# Patient Record
Sex: Male | Born: 1945 | Race: White | Hispanic: No | Smoking: Current some day smoker
Health system: Southern US, Community
[De-identification: ages and names within clinical notes are randomized; demographics above are authoritative.]

## PROBLEM LIST (undated history)

## (undated) DIAGNOSIS — J449 Chronic obstructive pulmonary disease, unspecified: Secondary | ICD-10-CM

## (undated) DIAGNOSIS — K810 Acute cholecystitis: Secondary | ICD-10-CM

## (undated) DIAGNOSIS — K838 Other specified diseases of biliary tract: Secondary | ICD-10-CM

## (undated) DIAGNOSIS — C4491 Basal cell carcinoma of skin, unspecified: Secondary | ICD-10-CM

## (undated) DIAGNOSIS — F1010 Alcohol abuse, uncomplicated: Secondary | ICD-10-CM

## (undated) DIAGNOSIS — I10 Essential (primary) hypertension: Secondary | ICD-10-CM

## (undated) HISTORY — PX: BASAL CELL CARCINOMA EXCISION: SHX1214

## (undated) HISTORY — DX: Acute cholecystitis: K81.0

## (undated) HISTORY — PX: LUMBAR LAMINECTOMY: SHX95

## (undated) HISTORY — PX: REPLACEMENT TOTAL KNEE BILATERAL: SUR1225

---

## 1999-04-15 ENCOUNTER — Inpatient Hospital Stay: Admission: EM | Admit: 1999-04-15 | Discharge: 1999-04-19 | Payer: Self-pay | Admitting: Emergency Medicine

## 1999-04-15 ENCOUNTER — Encounter: Payer: Self-pay | Admitting: Pulmonary Disease

## 1999-04-27 ENCOUNTER — Inpatient Hospital Stay (HOSPITAL_COMMUNITY): Admission: AD | Admit: 1999-04-27 | Discharge: 1999-05-01 | Payer: Self-pay | Admitting: *Deleted

## 1999-05-02 ENCOUNTER — Other Ambulatory Visit: Admission: RE | Admit: 1999-05-02 | Discharge: 1999-05-12 | Payer: Self-pay | Admitting: Psychiatry

## 1999-05-21 ENCOUNTER — Emergency Department (HOSPITAL_COMMUNITY): Admission: EM | Admit: 1999-05-21 | Discharge: 1999-05-21 | Payer: Self-pay | Admitting: Emergency Medicine

## 1999-05-22 ENCOUNTER — Other Ambulatory Visit (HOSPITAL_COMMUNITY): Admission: RE | Admit: 1999-05-22 | Discharge: 1999-06-02 | Payer: Self-pay | Admitting: Psychiatry

## 2012-04-23 DIAGNOSIS — Z85828 Personal history of other malignant neoplasm of skin: Secondary | ICD-10-CM | POA: Insufficient documentation

## 2015-03-04 DIAGNOSIS — D72829 Elevated white blood cell count, unspecified: Secondary | ICD-10-CM | POA: Insufficient documentation

## 2015-03-04 DIAGNOSIS — R6510 Systemic inflammatory response syndrome (SIRS) of non-infectious origin without acute organ dysfunction: Secondary | ICD-10-CM | POA: Insufficient documentation

## 2015-03-04 DIAGNOSIS — F1990 Other psychoactive substance use, unspecified, uncomplicated: Secondary | ICD-10-CM | POA: Insufficient documentation

## 2015-03-04 DIAGNOSIS — G9340 Encephalopathy, unspecified: Secondary | ICD-10-CM | POA: Insufficient documentation

## 2015-03-04 DIAGNOSIS — R4182 Altered mental status, unspecified: Secondary | ICD-10-CM | POA: Insufficient documentation

## 2015-03-04 DIAGNOSIS — M6282 Rhabdomyolysis: Secondary | ICD-10-CM | POA: Insufficient documentation

## 2015-03-04 DIAGNOSIS — E876 Hypokalemia: Secondary | ICD-10-CM | POA: Diagnosis present

## 2015-03-07 DIAGNOSIS — E870 Hyperosmolality and hypernatremia: Secondary | ICD-10-CM | POA: Insufficient documentation

## 2017-04-26 ENCOUNTER — Encounter (HOSPITAL_COMMUNITY): Payer: Self-pay | Admitting: *Deleted

## 2017-04-26 ENCOUNTER — Other Ambulatory Visit: Payer: Self-pay

## 2017-04-26 ENCOUNTER — Inpatient Hospital Stay (HOSPITAL_COMMUNITY)
Admission: EM | Admit: 2017-04-26 | Discharge: 2017-05-02 | DRG: 444 | Disposition: A | Discharge status: Home / Self Care | Payer: Medicare Other | Attending: Surgery | Admitting: Surgery

## 2017-04-26 DIAGNOSIS — Z85828 Personal history of other malignant neoplasm of skin: Secondary | ICD-10-CM

## 2017-04-26 DIAGNOSIS — F17210 Nicotine dependence, cigarettes, uncomplicated: Secondary | ICD-10-CM | POA: Diagnosis present

## 2017-04-26 DIAGNOSIS — K0889 Other specified disorders of teeth and supporting structures: Secondary | ICD-10-CM | POA: Diagnosis present

## 2017-04-26 DIAGNOSIS — K82A1 Gangrene of gallbladder in cholecystitis: Secondary | ICD-10-CM | POA: Diagnosis present

## 2017-04-26 DIAGNOSIS — K8000 Calculus of gallbladder with acute cholecystitis without obstruction: Secondary | ICD-10-CM | POA: Diagnosis not present

## 2017-04-26 DIAGNOSIS — M549 Dorsalgia, unspecified: Secondary | ICD-10-CM | POA: Diagnosis present

## 2017-04-26 DIAGNOSIS — J869 Pyothorax without fistula: Secondary | ICD-10-CM | POA: Diagnosis present

## 2017-04-26 DIAGNOSIS — K810 Acute cholecystitis: Secondary | ICD-10-CM | POA: Diagnosis not present

## 2017-04-26 DIAGNOSIS — E876 Hypokalemia: Secondary | ICD-10-CM | POA: Diagnosis not present

## 2017-04-26 DIAGNOSIS — K838 Other specified diseases of biliary tract: Secondary | ICD-10-CM

## 2017-04-26 DIAGNOSIS — Y838 Other surgical procedures as the cause of abnormal reaction of the patient, or of later complication, without mention of misadventure at the time of the procedure: Secondary | ICD-10-CM | POA: Diagnosis not present

## 2017-04-26 DIAGNOSIS — Z79899 Other long term (current) drug therapy: Secondary | ICD-10-CM

## 2017-04-26 DIAGNOSIS — I10 Essential (primary) hypertension: Secondary | ICD-10-CM | POA: Diagnosis present

## 2017-04-26 DIAGNOSIS — K219 Gastro-esophageal reflux disease without esophagitis: Secondary | ICD-10-CM | POA: Diagnosis present

## 2017-04-26 DIAGNOSIS — C439 Malignant melanoma of skin, unspecified: Secondary | ICD-10-CM | POA: Diagnosis present

## 2017-04-26 DIAGNOSIS — K9189 Other postprocedural complications and disorders of digestive system: Secondary | ICD-10-CM | POA: Diagnosis not present

## 2017-04-26 DIAGNOSIS — Z86010 Personal history of colonic polyps: Secondary | ICD-10-CM

## 2017-04-26 DIAGNOSIS — G8929 Other chronic pain: Secondary | ICD-10-CM | POA: Diagnosis present

## 2017-04-26 DIAGNOSIS — K029 Dental caries, unspecified: Secondary | ICD-10-CM | POA: Diagnosis present

## 2017-04-26 DIAGNOSIS — Z96653 Presence of artificial knee joint, bilateral: Secondary | ICD-10-CM | POA: Diagnosis present

## 2017-04-26 DIAGNOSIS — Z791 Long term (current) use of non-steroidal anti-inflammatories (NSAID): Secondary | ICD-10-CM

## 2017-04-26 HISTORY — DX: Alcohol abuse, uncomplicated: F10.10

## 2017-04-26 HISTORY — DX: Basal cell carcinoma of skin, unspecified: C44.91

## 2017-04-26 HISTORY — DX: Essential (primary) hypertension: I10

## 2017-04-26 LAB — CBC WITH DIFFERENTIAL/PLATELET
Basophils Absolute: 0 10*3/uL (ref 0.0–0.1)
Basophils Relative: 0 %
Eosinophils Absolute: 0 10*3/uL (ref 0.0–0.7)
Eosinophils Relative: 0 %
HCT: 33.9 % — ABNORMAL LOW (ref 39.0–52.0)
Hemoglobin: 11.8 g/dL — ABNORMAL LOW (ref 13.0–17.0)
Lymphocytes Relative: 2 %
Lymphs Abs: 0.4 10*3/uL — ABNORMAL LOW (ref 0.7–4.0)
MCH: 31.6 pg (ref 26.0–34.0)
MCHC: 34.8 g/dL (ref 30.0–36.0)
MCV: 90.6 fL (ref 78.0–100.0)
Monocytes Absolute: 1 10*3/uL (ref 0.1–1.0)
Monocytes Relative: 5 %
Neutro Abs: 16.7 10*3/uL — ABNORMAL HIGH (ref 1.7–7.7)
Neutrophils Relative %: 93 %
Platelets: 173 10*3/uL (ref 150–400)
RBC: 3.74 MIL/uL — ABNORMAL LOW (ref 4.22–5.81)
RDW: 13.6 % (ref 11.5–15.5)
WBC: 18.1 10*3/uL — ABNORMAL HIGH (ref 4.0–10.5)

## 2017-04-26 LAB — COMPREHENSIVE METABOLIC PANEL
ALT: 18 U/L (ref 17–63)
AST: 20 U/L (ref 15–41)
Albumin: 2.9 g/dL — ABNORMAL LOW (ref 3.5–5.0)
Alkaline Phosphatase: 89 U/L (ref 38–126)
Anion gap: 16 — ABNORMAL HIGH (ref 5–15)
BUN: 40 mg/dL — ABNORMAL HIGH (ref 6–20)
CO2: 20 mmol/L — ABNORMAL LOW (ref 22–32)
Calcium: 8.7 mg/dL — ABNORMAL LOW (ref 8.9–10.3)
Chloride: 95 mmol/L — ABNORMAL LOW (ref 101–111)
Creatinine, Ser: 1.62 mg/dL — ABNORMAL HIGH (ref 0.61–1.24)
GFR calc Af Amer: 48 mL/min — ABNORMAL LOW (ref >60)
GFR calc non Af Amer: 42 mL/min — ABNORMAL LOW (ref >60)
Glucose, Bld: 114 mg/dL — ABNORMAL HIGH (ref 65–99)
Potassium: 3.1 mmol/L — ABNORMAL LOW (ref 3.5–5.1)
Sodium: 131 mmol/L — ABNORMAL LOW (ref 135–145)
Total Bilirubin: 1.3 mg/dL — ABNORMAL HIGH (ref 0.3–1.2)
Total Protein: 7 g/dL (ref 6.5–8.1)

## 2017-04-26 LAB — LIPASE, BLOOD: Lipase: 14 U/L (ref 11–51)

## 2017-04-26 MED ORDER — SODIUM CHLORIDE 0.9 % IV BOLUS
1000.0000 mL | Freq: Once | INTRAVENOUS | Status: AC
  Administered 2017-04-26: 1000 mL via INTRAVENOUS

## 2017-04-26 MED ORDER — METOCLOPRAMIDE HCL 5 MG/ML IJ SOLN
10.0000 mg | Freq: Once | INTRAMUSCULAR | Status: AC
  Administered 2017-04-26: 10 mg via INTRAVENOUS
  Filled 2017-04-26: qty 2

## 2017-04-26 NOTE — ED Provider Notes (Signed)
Patient placed in Quick Look pathway, seen and evaluated   Chief Complaint: Abdominal Pain, Dental pain   HPI:   72 y.o. male who presents for evaluation of multiple complaints today.  Patient reports that he has had worsening dental pain over the last several weeks.  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  Patient reports he is also felt like he is having some mild gum swelling noted to the right upper gum.  Additionally, patient reports that for the last 3 days, he has had some lower abdominal pain, most notably on the right side.  He reports he has had some decreased appetite.  Patient states that he has not had any nausea/vomiting.  Patient reports that he was concerned because his blood pressure was low, though he did not record a specific number.  Patient states that he measured a fever last night and states it was "1 degree higher than normal."  Patient states he came into the ED because he was concerned about sepsis.  Patient denies any chest pain, difficulty breathing, facial swelling.  ROS: Abdominal pain, dental pain  Physical Exam:   Gen: No distress  Neuro: Awake and Alert  Skin: Warm    Focused Exam: Diffuse dental caries noted throughout mouth.  Patient has tenderness palpation noted to the right upper gumline with no obvious gingival erythema, fluctuance.  No identifiable dental abscess.  Abdomen is soft, nondistended.  Patient does exhibit some tenderness to the right lower quadrant.  No rigidity, guarding.   Initiation of care has begun. The patient has been counseled on the process, plan, and necessity for staying for the completion/evaluation, and the remainder of the medical screening examination    Volanda Napoleon, PA-C 04/26/17 1657    Pattricia Boss, MD 04/27/17 [phone]

## 2017-04-26 NOTE — ED Triage Notes (Signed)
Pt in reports low BP, pt has broken teeth and enlarged, red gums with poor dental hygeine, pt c/o R inguinal pain that radiates to R shoulder, pt states, "I am afraid it is Sepsis. I have crusting in my nose and I am afraid it is MRSA." pt does not have open wounds on face, A&O x4

## 2017-04-27 ENCOUNTER — Encounter (HOSPITAL_COMMUNITY): Payer: Self-pay | Admitting: *Deleted

## 2017-04-27 ENCOUNTER — Other Ambulatory Visit: Payer: Self-pay

## 2017-04-27 ENCOUNTER — Emergency Department (HOSPITAL_COMMUNITY): Payer: Medicare Other

## 2017-04-27 DIAGNOSIS — K810 Acute cholecystitis: Secondary | ICD-10-CM

## 2017-04-27 HISTORY — DX: Acute cholecystitis: K81.0

## 2017-04-27 LAB — COMPREHENSIVE METABOLIC PANEL
ALBUMIN: 2.8 g/dL — AB (ref 3.5–5.0)
ALT: 18 U/L (ref 17–63)
ANION GAP: 12 (ref 5–15)
AST: 19 U/L (ref 15–41)
Alkaline Phosphatase: 104 U/L (ref 38–126)
BILIRUBIN TOTAL: 1.2 mg/dL (ref 0.3–1.2)
BUN: 23 mg/dL — ABNORMAL HIGH (ref 6–20)
CO2: 23 mmol/L (ref 22–32)
Calcium: 8.7 mg/dL — ABNORMAL LOW (ref 8.9–10.3)
Chloride: 99 mmol/L — ABNORMAL LOW (ref 101–111)
Creatinine, Ser: 1 mg/dL (ref 0.61–1.24)
GFR calc non Af Amer: >60 mL/min (ref >60)
GLUCOSE: 106 mg/dL — AB (ref 65–99)
POTASSIUM: 2.8 mmol/L — AB (ref 3.5–5.1)
SODIUM: 134 mmol/L — AB (ref 135–145)
Total Protein: 6.6 g/dL (ref 6.5–8.1)

## 2017-04-27 LAB — URINALYSIS, ROUTINE W REFLEX MICROSCOPIC
Bacteria, UA: NONE SEEN
Bilirubin Urine: NEGATIVE
Glucose, UA: NEGATIVE mg/dL
Ketones, ur: NEGATIVE mg/dL
Leukocytes, UA: NEGATIVE
Nitrite: NEGATIVE
Protein, ur: 30 mg/dL — AB
Specific Gravity, Urine: 1.018 (ref 1.005–1.030)
pH: 5 (ref 5.0–8.0)

## 2017-04-27 LAB — CBC
HEMATOCRIT: 36.2 % — AB (ref 39.0–52.0)
HEMOGLOBIN: 12.9 g/dL — AB (ref 13.0–17.0)
MCH: 32 pg (ref 26.0–34.0)
MCHC: 35.6 g/dL (ref 30.0–36.0)
MCV: 89.8 fL (ref 78.0–100.0)
Platelets: 183 10*3/uL (ref 150–400)
RBC: 4.03 MIL/uL — AB (ref 4.22–5.81)
RDW: 13.6 % (ref 11.5–15.5)
WBC: 17.7 10*3/uL — ABNORMAL HIGH (ref 4.0–10.5)

## 2017-04-27 MED ORDER — CLONIDINE HCL 0.1 MG PO TABS
0.1000 mg | ORAL_TABLET | Freq: Three times a day (TID) | ORAL | Status: DC
  Administered 2017-04-27 – 2017-05-02 (×15): 0.1 mg via ORAL
  Filled 2017-04-27 (×16): qty 1

## 2017-04-27 MED ORDER — IOPAMIDOL (ISOVUE-300) INJECTION 61%
100.0000 mL | Freq: Once | INTRAVENOUS | Status: AC | PRN
  Administered 2017-04-27: 100 mL via INTRAVENOUS

## 2017-04-27 MED ORDER — TAMSULOSIN HCL 0.4 MG PO CAPS
0.4000 mg | ORAL_CAPSULE | Freq: Every day | ORAL | Status: DC
  Administered 2017-04-27 – 2017-05-02 (×4): 0.4 mg via ORAL
  Filled 2017-04-27 (×4): qty 1

## 2017-04-27 MED ORDER — KCL IN DEXTROSE-NACL 40-5-0.9 MEQ/L-%-% IV SOLN
INTRAVENOUS | Status: DC
  Administered 2017-04-27 – 2017-04-28 (×3): via INTRAVENOUS
  Filled 2017-04-27 (×5): qty 1000

## 2017-04-27 MED ORDER — HYDROMORPHONE HCL 2 MG/ML IJ SOLN
0.5000 mg | Freq: Once | INTRAMUSCULAR | Status: AC
  Administered 2017-04-27: 0.5 mg via INTRAVENOUS
  Filled 2017-04-27: qty 1

## 2017-04-27 MED ORDER — CARVEDILOL 25 MG PO TABS
25.0000 mg | ORAL_TABLET | Freq: Two times a day (BID) | ORAL | Status: DC
  Administered 2017-04-27 – 2017-05-02 (×10): 25 mg via ORAL
  Filled 2017-04-27 (×10): qty 1

## 2017-04-27 MED ORDER — HYDROCHLOROTHIAZIDE 25 MG PO TABS
25.0000 mg | ORAL_TABLET | Freq: Every day | ORAL | Status: DC
  Administered 2017-04-27 – 2017-05-02 (×4): 25 mg via ORAL
  Filled 2017-04-27 (×6): qty 1

## 2017-04-27 MED ORDER — MELOXICAM 7.5 MG PO TABS
15.0000 mg | ORAL_TABLET | Freq: Every day | ORAL | Status: DC
  Administered 2017-04-27 – 2017-05-02 (×4): 15 mg via ORAL
  Filled 2017-04-27: qty 1
  Filled 2017-04-27: qty 2
  Filled 2017-04-27: qty 1
  Filled 2017-04-27 (×3): qty 2

## 2017-04-27 MED ORDER — LISINOPRIL 40 MG PO TABS
40.0000 mg | ORAL_TABLET | Freq: Every day | ORAL | Status: DC
  Administered 2017-04-27 – 2017-05-02 (×5): 40 mg via ORAL
  Filled 2017-04-27 (×5): qty 1

## 2017-04-27 MED ORDER — AMLODIPINE BESYLATE 5 MG PO TABS
5.0000 mg | ORAL_TABLET | Freq: Every day | ORAL | Status: DC
  Administered 2017-04-27 – 2017-05-02 (×5): 5 mg via ORAL
  Filled 2017-04-27 (×5): qty 1

## 2017-04-27 MED ORDER — IOPAMIDOL (ISOVUE-300) INJECTION 61%
INTRAVENOUS | Status: AC
  Filled 2017-04-27: qty 100

## 2017-04-27 MED ORDER — HYDROMORPHONE HCL 2 MG/ML IJ SOLN
1.0000 mg | INTRAMUSCULAR | Status: DC | PRN
  Administered 2017-04-27: 1 mg via INTRAVENOUS
  Administered 2017-04-27 – 2017-04-30 (×18): 2 mg via INTRAVENOUS
  Filled 2017-04-27 (×20): qty 1

## 2017-04-27 MED ORDER — PANTOPRAZOLE SODIUM 40 MG PO TBEC
40.0000 mg | DELAYED_RELEASE_TABLET | Freq: Every day | ORAL | Status: DC
  Administered 2017-04-27 – 2017-05-02 (×4): 40 mg via ORAL
  Filled 2017-04-27 (×4): qty 1

## 2017-04-27 MED ORDER — ONDANSETRON HCL 4 MG/2ML IJ SOLN
4.0000 mg | Freq: Four times a day (QID) | INTRAMUSCULAR | Status: DC | PRN
  Administered 2017-04-28: 4 mg via INTRAVENOUS

## 2017-04-27 MED ORDER — GABAPENTIN 400 MG PO CAPS
800.0000 mg | ORAL_CAPSULE | Freq: Three times a day (TID) | ORAL | Status: DC
  Administered 2017-04-27 – 2017-05-02 (×14): 800 mg via ORAL
  Filled 2017-04-27 (×6): qty 2
  Filled 2017-04-27 (×2): qty 8
  Filled 2017-04-27 (×6): qty 2

## 2017-04-27 MED ORDER — ONDANSETRON 4 MG PO TBDP: 4.0000 mg | ORAL_TABLET | Freq: Four times a day (QID) | ORAL | Status: DC | PRN

## 2017-04-27 MED ORDER — PNEUMOCOCCAL VAC POLYVALENT 25 MCG/0.5ML IJ INJ: 0.5000 mL | INJECTION | INTRAMUSCULAR | Status: DC

## 2017-04-27 MED ORDER — NICOTINE 14 MG/24HR TD PT24
14.0000 mg | MEDICATED_PATCH | Freq: Once | TRANSDERMAL | Status: AC
  Administered 2017-04-27: 14 mg via TRANSDERMAL
  Filled 2017-04-27: qty 1

## 2017-04-27 MED ORDER — METHOCARBAMOL 750 MG PO TABS
750.0000 mg | ORAL_TABLET | Freq: Three times a day (TID) | ORAL | Status: DC
  Administered 2017-04-27 – 2017-05-02 (×14): 750 mg via ORAL
  Filled 2017-04-27 (×8): qty 1
  Filled 2017-04-27: qty 2
  Filled 2017-04-27 (×5): qty 1

## 2017-04-27 MED ORDER — SODIUM CHLORIDE 0.9 % IV SOLN
2.0000 g | INTRAVENOUS | Status: DC
  Administered 2017-04-27 – 2017-04-28 (×2): 2 g via INTRAVENOUS
  Filled 2017-04-27 (×2): qty 20

## 2017-04-27 NOTE — ED Notes (Signed)
[facility] called to paged Dr about a nicotine patch per RM Haydens request

## 2017-04-27 NOTE — ED Provider Notes (Signed)
[facility] EMERGENCY DEPARTMENT Provider Note   CSN: [id number] Arrival date & time: 04/26/17  1605     History   Chief Complaint Chief Complaint  Patient presents with  . Dental Pain  . Abdominal Pain    HPI Gerald Farrell is a 72 y.o. male.  Patient with a history of HTN presents with c/o abdominal pain x 5 days located in the right and upper mid-abdomen. No fever, nausea or vomiting. No diarrhea. He reports having no appetite or significant PO intake for the 5 days he has had pain. No history of similar symptoms. He reports that he has shortness or breath but describes breathing is limited due to breathing makes the abdominal pain worse. No cough, chest pain. The abdominal pain radiates to the right shoulder. No urinary symptoms.   The history is provided by the patient. No language interpreter was used.  Dental Pain    Abdominal Pain   Pertinent negatives include fever, diarrhea, nausea, vomiting, dysuria and hematuria.    Past Medical History:  Diagnosis Date  . Hypertension     There are no active problems to display for this patient.   Past Surgical History:  Procedure Laterality Date  . laminectomy    . REPLACEMENT TOTAL KNEE BILATERAL          Home Medications    Prior to Admission medications   Medication Sig Start Date End Date Taking? Authorizing Provider  amLODipine (NORVASC) 10 MG tablet Take 5 mg by mouth daily.   Yes [provider]  carvedilol (COREG) 25 MG tablet Take 25 mg by mouth 2 (two) times daily with a meal.   Yes [provider]  cloNIDine (CATAPRES) 0.1 MG tablet Take 0.1 mg by mouth 3 (three) times daily.   Yes [provider]  diclofenac sodium (VOLTAREN) 1 % GEL Apply 2 g topically daily as needed (pain).   Yes [provider]  gabapentin (NEURONTIN) 400 MG capsule Take 800 mg by mouth 3 (three) times daily.   Yes [provider]  hydrochlorothiazide (HYDRODIURIL) 25  MG tablet Take 25 mg by mouth daily.   Yes [provider]  ibuprofen (ADVIL,MOTRIN) 200 MG tablet Take 800 mg by mouth every 6 (six) hours as needed for moderate pain.   Yes [provider]  ketoconazole (NIZORAL) 2 % cream Apply 1 application topically daily as needed for irritation.   Yes [provider]  lisinopril (PRINIVIL,ZESTRIL) 40 MG tablet Take 40 mg by mouth daily.   Yes [provider]  meloxicam (MOBIC) 15 MG tablet Take 15 mg by mouth daily.   Yes [provider]  methocarbamol (ROBAXIN) 750 MG tablet Take 750 mg by mouth 3 (three) times daily.   Yes [provider]  omeprazole (PRILOSEC) 20 MG capsule Take 20 mg by mouth daily.   Yes [provider]  oxycodone (OXY-IR) 5 MG capsule Take 5 mg by mouth 3 (three) times daily as needed for pain.   Yes [provider]  tamsulosin (FLOMAX) 0.4 MG CAPS capsule Take 0.4 mg by mouth daily.   Yes [provider]    Family History No family history on file.  Social History Social History   Tobacco Use  . Smoking status: Current Every Day Smoker    Packs/day: 0.30    Types: Cigarettes  . Smokeless tobacco: Never Used  Substance Use Topics  . Alcohol use: Not Currently  . Drug use: Not Currently  Allergies   Patient has no known allergies.   Review of Systems Review of Systems  Constitutional: Negative for chills and fever.  HENT: Negative.   Respiratory: Negative.  Negative for cough.   Cardiovascular: Negative.  Negative for chest pain.  Gastrointestinal: Positive for abdominal pain. Negative for diarrhea, nausea and vomiting.  Genitourinary: Negative for dysuria and hematuria.  Musculoskeletal: Negative.   Skin: Negative.   Neurological: Negative.      Physical Exam Updated Vital Signs BP (!) 154/69 (BP Location: Left Arm)   Pulse 87   Temp 98.8 F (37.1 C) (Oral)   Resp 16   SpO2 96%   Physical Exam  Constitutional: He  appears well-developed and well-nourished.  HENT:  Head: Normocephalic.  Neck: Normal range of motion. Neck supple.  Cardiovascular: Normal rate and regular rhythm.  Pulmonary/Chest: Effort normal and breath sounds normal. He has no rhonchi. He has no rales.  Abdominal: Soft. Bowel sounds are normal. There is tenderness in the right upper quadrant and epigastric area. There is guarding. There is no rebound.  Musculoskeletal: Normal range of motion.  Neurological: He is alert. No cranial nerve deficit.  Skin: Skin is warm and dry. No rash noted.  Psychiatric: He has a normal mood and affect.     ED Treatments / Results  Labs (all labs ordered are listed, but only abnormal results are displayed) Labs Reviewed  CBC WITH DIFFERENTIAL/PLATELET - Abnormal; Notable for the following components:      Result Value   WBC 18.1 (*)    RBC 3.74 (*)    Hemoglobin 11.8 (*)    HCT 33.9 (*)    Neutro Abs 16.7 (*)    Lymphs Abs 0.4 (*)    All other components within normal limits  COMPREHENSIVE METABOLIC PANEL - Abnormal; Notable for the following components:   Sodium 131 (*)    Potassium 3.1 (*)    Chloride 95 (*)    CO2 20 (*)    Glucose, Bld 114 (*)    BUN 40 (*)    Creatinine, Ser 1.62 (*)    Calcium 8.7 (*)    Albumin 2.9 (*)    Total Bilirubin 1.3 (*)    GFR calc non Af Amer 42 (*)    GFR calc Af Amer 48 (*)    Anion gap 16 (*)    All other components within normal limits  URINALYSIS, ROUTINE W REFLEX MICROSCOPIC - Abnormal; Notable for the following components:   Hgb urine dipstick SMALL (*)    Protein, ur 30 (*)    All other components within normal limits  URINE CULTURE  LIPASE, BLOOD    EKG None  Radiology Ct Abdomen Pelvis W Contrast  Result Date: 04/27/2017 CLINICAL DATA:  Right inguinal pain radiating to the right shoulder. Acute generalized abdominal pain. EXAM: CT ABDOMEN AND PELVIS WITH CONTRAST TECHNIQUE: Multidetector CT imaging of the abdomen and pelvis was  performed using the standard protocol following bolus administration of intravenous contrast. CONTRAST:  112mL ISOVUE-300 IOPAMIDOL (ISOVUE-300) INJECTION 61% COMPARISON:  None. FINDINGS: Lower chest: Atelectasis or consolidation in both lung bases, greater on the right. This could indicate pneumonia. Hepatobiliary: Gallbladder is distended with mildly thickened wall and mild pericholecystic edema. No stones identified. No bile duct dilatation. This appearance is nonspecific but could indicate acute cholecystitis, chronic inflammatory process, or may be related to ascites or other infectious etiology. No focal liver lesions. Pancreas: Unremarkable. No pancreatic ductal dilatation or surrounding inflammatory changes. Spleen: Normal  in size without focal abnormality. Adrenals/Urinary Tract: Adrenal glands are unremarkable. Kidneys are normal, without renal calculi, focal lesion, or hydronephrosis. Bladder is unremarkable. Stomach/Bowel: Stomach, small bowel, and colon are not abnormally distended. Stool diffusely fills the colon. No wall thickening or inflammatory changes appreciated. Appendix is normal. Vascular/Lymphatic: Aortic atherosclerosis. No enlarged abdominal or pelvic lymph nodes. Reproductive: Prostate is unremarkable. Other: Small amount of free fluid in the right upper quadrant. No free air in the abdomen. Abdominal wall musculature appears intact. Musculoskeletal: Degenerative changes in the spine. Postoperative laminectomies at L3-4 and L4-5. No destructive bone lesions. IMPRESSION: 1. Distended and thick-walled gallbladder with pericholecystic edema likely due to cholecystitis although other inflammatory or infectious changes could also have this appearance. Small amount of free fluid in the right upper quadrant is likely reactive. Consider ultrasound for further evaluation. 2. Atelectasis or consolidation in both lung bases, greater on the right possibly indicating pneumonia. 3. Aortic  atherosclerosis. 4. Diffusely stool-filled colon. Electronically Signed   By: Lucienne Capers M.D.   On: 04/27/2017 00:59    Procedures Procedures (including critical care time)  Medications Ordered in ED Medications  iopamidol (ISOVUE-300) 61 % injection (has no administration in time range)  sodium chloride 0.9 % bolus 1,000 mL (1,000 mLs Intravenous New Bag/Given 04/26/17 2328)  metoCLOPramide (REGLAN) injection 10 mg (10 mg Intravenous Given 04/26/17 2330)  iopamidol (ISOVUE-300) 61 % injection 100 mL (100 mLs Intravenous Contrast Given 04/27/17 0020)     Initial Impression / Assessment and Plan / ED Course  I have reviewed the triage vital signs and the nursing notes.  Pertinent labs & imaging results that were available during my care of the patient were reviewed by me and considered in my medical decision making (see chart for details).     Patient presents with abdominal pain x 5 days. No fever, N, V, D. Pain is upper abdomen to back. Radiates to right shoulder. No modifying factors including PO intake.  VSS. He has been afebrile here. CT scan ordered in evaluation of abdominal pain, leukocytosis. Abdominal US subsequently ordered to confirm isolated cholecystitis.   Patient's pain is managed. He has been kept NPO during ED encounter.   Discussed findings with Dr. Rosendo Gros who will see the patient in the ED. Patient updated on progress and care plan.   Final Clinical Impressions(s) / ED Diagnoses   Final diagnoses:  None   1. Acute cholecystitis  ED Discharge Orders    None       Charlann Lange, Hershal Coria 95/09/32 6712    Delora Fuel, MD 45/80/99 [phone]

## 2017-04-27 NOTE — H&P (Signed)
Gerald Farrell is an 72 y.o. male.   Chief Complaint: Abdominal pain HPI: Patient is a 72 year old male with a history of hypertension and chronic back pain on chronic pain medication, who comes in with a 5-day history of abdominal pain.  Patient states that the abdominal pain began in the right lower quadrant and worked his way up to the right upper quadrant.  He states that it is currently in the right upper quadrant and does radiate up the chest and around the back.  Patient states that over the last 5 days he has had decreased appetite.  He denies any nausea or vomiting.  Patient was concerned with sepsis from dental issues.  Upon evaluation in the ER he underwent ultrasound, CT scan, laboratory studies.  CT scan and ultrasound revealed gallbladder with gallstones, gallbladder wall thickening consistent with cholecystitis.  I did review the scans myself.  Patient laboratory studies reveal an elevated WBC count and elevated T bili at 1.3.  AST/ALT, lipase were all within normal limits.  General surgery was consulted for further evaluation and management  Past Medical History:  Diagnosis Date  . Hypertension     Past Surgical History:  Procedure Laterality Date  . laminectomy    . REPLACEMENT TOTAL KNEE BILATERAL      No family history on file. Social History:  reports that he has been smoking cigarettes.  He has been smoking about 0.30 packs per day. He has never used smokeless tobacco. He reports that he drank alcohol. He reports that he has current or past drug history.  Allergies: No Known Allergies   (Not in a hospital admission)  Results for orders placed or performed during the hospital encounter of 04/26/17 (from the past 48 hour(s))  CBC with Differential     Status: Abnormal   Collection Time: 04/26/17  4:42 PM  Result Value Ref Range   WBC 18.1 (H) 4.0 - 10.5 K/uL   RBC 3.74 (L) 4.22 - 5.81 MIL/uL   Hemoglobin 11.8 (L) 13.0 - 17.0 g/dL   HCT 33.9 (L) 39.0 - 52.0 %    MCV 90.6 78.0 - 100.0 fL   MCH 31.6 26.0 - 34.0 pg   MCHC 34.8 30.0 - 36.0 g/dL   RDW 13.6 11.5 - 15.5 %   Platelets 173 150 - 400 K/uL   Neutrophils Relative % 93 %   Neutro Abs 16.7 (H) 1.7 - 7.7 K/uL   Lymphocytes Relative 2 %   Lymphs Abs 0.4 (L) 0.7 - 4.0 K/uL   Monocytes Relative 5 %   Monocytes Absolute 1.0 0.1 - 1.0 K/uL   Eosinophils Relative 0 %   Eosinophils Absolute 0.0 0.0 - 0.7 K/uL   Basophils Relative 0 %   Basophils Absolute 0.0 0.0 - 0.1 K/uL    Comment: Performed at [facility], [address], [place], [place] [address]  Comprehensive metabolic panel     Status: Abnormal   Collection Time: 04/26/17  4:42 PM  Result Value Ref Range   Sodium 131 (L) 135 - 145 mmol/L   Potassium 3.1 (L) 3.5 - 5.1 mmol/L   Chloride 95 (L) 101 - 111 mmol/L   CO2 20 (L) 22 - 32 mmol/L   Glucose, Bld 114 (H) 65 - 99 mg/dL   BUN 40 (H) 6 - 20 mg/dL   Creatinine, Ser 1.62 (H) 0.61 - 1.24 mg/dL   Calcium 8.7 (L) 8.9 - 10.3 mg/dL   Total Protein 7.0 6.5 - 8.1  g/dL   Albumin 2.9 (L) 3.5 - 5.0 g/dL   AST 20 15 - 41 U/L   ALT 18 17 - 63 U/L   Alkaline Phosphatase 89 38 - 126 U/L   Total Bilirubin 1.3 (H) 0.3 - 1.2 mg/dL   GFR calc non Af Amer 42 (L) >60 mL/min   GFR calc Af Amer 48 (L) >60 mL/min    Comment: (NOTE) The eGFR has been calculated using the CKD EPI equation. This calculation has not been validated in all clinical situations. eGFR's persistently <60 mL/min signify possible Chronic Kidney Disease.    Anion gap 16 (H) 5 - 15    Comment: Performed at [facility], [place] [address], [place], [place] [address]  Lipase, blood     Status: None   Collection Time: 04/26/17  4:42 PM  Result Value Ref Range   Lipase 14 11 - 51 U/L    Comment: Performed at [facility] [address] [place], [place] [address]  Urinalysis, Routine w reflex microscopic     Status: Abnormal   Collection Time: 04/26/17 11:20 PM  Result Value Ref Range   Color, Urine YELLOW  YELLOW   APPearance CLEAR CLEAR   Specific Gravity, Urine 1.018 1.005 - 1.030   pH 5.0 5.0 - 8.0   Glucose, UA NEGATIVE NEGATIVE mg/dL   Hgb urine dipstick SMALL (A) NEGATIVE   Bilirubin Urine NEGATIVE NEGATIVE   Ketones, ur NEGATIVE NEGATIVE mg/dL   Protein, ur 30 (A) NEGATIVE mg/dL   Nitrite NEGATIVE NEGATIVE   Leukocytes, UA NEGATIVE NEGATIVE   RBC / HPF 0-5 0 - 5 RBC/hpf   WBC, UA 0-5 0 - 5 WBC/hpf   Bacteria, UA NONE SEEN NONE SEEN    Comment: Performed at [facility] [address] [place], [place] [address]   Ct Abdomen Pelvis W Contrast  Result Date: 04/27/2017 CLINICAL DATA:  Right inguinal pain radiating to the right shoulder. Acute generalized abdominal pain. EXAM: CT ABDOMEN AND PELVIS WITH CONTRAST TECHNIQUE: Multidetector CT imaging of the abdomen and pelvis was performed using the standard protocol following bolus administration of intravenous contrast. CONTRAST:  119m ISOVUE-300 IOPAMIDOL (ISOVUE-300) INJECTION 61% COMPARISON:  None. FINDINGS: Lower chest: Atelectasis or consolidation in both lung bases, greater on the right. This could indicate pneumonia. Hepatobiliary: Gallbladder is distended with mildly thickened wall and mild pericholecystic edema. No stones identified. No bile duct dilatation. This appearance is nonspecific but could indicate acute cholecystitis, chronic inflammatory process, or may be related to ascites or other infectious etiology. No focal liver lesions. Pancreas: Unremarkable. No pancreatic ductal dilatation or surrounding inflammatory changes. Spleen: Normal in size without focal abnormality. Adrenals/Urinary Tract: Adrenal glands are unremarkable. Kidneys are normal, without renal calculi, focal lesion, or hydronephrosis. Bladder is unremarkable. Stomach/Bowel: Stomach, small bowel, and colon are not abnormally distended. Stool diffusely fills the colon. No wall thickening or inflammatory changes appreciated. Appendix is normal.  Vascular/Lymphatic: Aortic atherosclerosis. No enlarged abdominal or pelvic lymph nodes. Reproductive: Prostate is unremarkable. Other: Small amount of free fluid in the right upper quadrant. No free air in the abdomen. Abdominal wall musculature appears intact. Musculoskeletal: Degenerative changes in the spine. Postoperative laminectomies at L3-4 and L4-5. No destructive bone lesions. IMPRESSION: 1. Distended and thick-walled gallbladder with pericholecystic edema likely due to cholecystitis although other inflammatory or infectious changes could also have this appearance. Small amount of free fluid in the right upper quadrant is likely reactive. Consider ultrasound for further  evaluation. 2. Atelectasis or consolidation in both lung bases, greater on the right possibly indicating pneumonia. 3. Aortic atherosclerosis. 4. Diffusely stool-filled colon. Electronically Signed   By: Lucienne Capers M.D.   On: 04/27/2017 00:59   US Abdomen Limited  Result Date: 04/27/2017 CLINICAL DATA:  Right upper quadrant pain for 1 week. Abnormal gallbladder on CT. EXAM: ULTRASOUND ABDOMEN LIMITED RIGHT UPPER QUADRANT COMPARISON:  CT abdomen and pelvis 04/27/2017 FINDINGS: Gallbladder: Layering sludge in the gallbladder. 6 mm echogenic focus in the dependent gallbladder wall likely to represent a stone. Small polyp not entirely excluded. Gallbladder wall thickening and edema with wall thickness up to 6 mm. Murphy's sign is positive. Common bile duct: Diameter: 5 mm, normal Liver: No focal lesion identified. Within normal limits in parenchymal echogenicity. Small amount of fluid around the liver edge. Portal vein is patent on color Doppler imaging with normal direction of blood flow towards the liver. IMPRESSION: Sludge and probable stone in the gallbladder, gallbladder wall thickening and edema, and positive Murphy's sign compatible with acute cholecystitis in the appropriate clinical setting. Small amount of free fluid around  the liver edge is probably reactive. Electronically Signed   By: Lucienne Capers M.D.   On: 04/27/2017 03:55    Review of Systems  Constitutional: Negative for chills, fever and malaise/fatigue.  HENT: Negative for ear discharge, hearing loss and sore throat.   Eyes: Negative for blurred vision and discharge.  Respiratory: Negative for cough and shortness of breath.   Cardiovascular: Negative for chest pain, orthopnea and leg swelling.  Gastrointestinal: Positive for abdominal pain. Negative for constipation, diarrhea, heartburn, nausea and vomiting.  Musculoskeletal: Negative for myalgias and neck pain.  Skin: Negative for itching and rash.  Neurological: Negative for dizziness, focal weakness, seizures and loss of consciousness.  Endo/Heme/Allergies: Negative for environmental allergies. Does not bruise/bleed easily.  Psychiatric/Behavioral: Negative for depression and suicidal ideas.  All other systems reviewed and are negative.   Blood pressure (!) 151/71, pulse 98, temperature 98.8 F (37.1 C), temperature source Oral, resp. rate 14, SpO2 93 %. Physical Exam  Constitutional: He is oriented to person, place, and time. Vital signs are normal. He appears well-developed and well-nourished.  Conversant No acute distress  Eyes: Pupils are equal, round, and reactive to light. Conjunctivae, EOM and lids are normal. No scleral icterus.  No lid lag Moist conjunctiva  Neck: Normal range of motion. Neck supple. No tracheal tenderness present. No thyromegaly present.  No cervical lymphadenopathy  Cardiovascular: Normal rate, regular rhythm and intact distal pulses.  No murmur heard. Respiratory: Effort normal and breath sounds normal. He has no wheezes. He has no rales.  GI: Bowel sounds are normal. There is no hepatosplenomegaly. There is tenderness in the right upper quadrant. There is no rigidity, no rebound and no guarding. No hernia.  Musculoskeletal: Normal range of motion.   Neurological: He is alert and oriented to person, place, and time.  Normal gait and station  Skin: Skin is warm. No rash noted. No cyanosis. Nails show no clubbing.  Normal skin turgor  Psychiatric: Judgment normal.  Appropriate affect     Assessment/Plan 72 year old male with acute cholecystitis Hypertension Chronic back pain  1.  We will admit into the floor, n.p.o., IV antibiotics 2.  The patient will require removal of gallbladder during his hospital stay over the next 24 to 48 hours.  Reyes Ivan, MD 04/27/2017, 5:32 AM

## 2017-04-27 NOTE — ED Notes (Signed)
Patient transported to Ultrasound 

## 2017-04-27 NOTE — ED Notes (Signed)
Consulting MD at bedside (general surgery).

## 2017-04-28 ENCOUNTER — Encounter (HOSPITAL_COMMUNITY): Admission: EM | Disposition: A | Discharge status: Home / Self Care | Payer: Self-pay | Source: Home / Self Care

## 2017-04-28 ENCOUNTER — Observation Stay (HOSPITAL_COMMUNITY): Payer: Medicare Other | Admitting: Certified Registered Nurse Anesthetist

## 2017-04-28 ENCOUNTER — Encounter (HOSPITAL_COMMUNITY): Payer: Self-pay | Admitting: Certified Registered Nurse Anesthetist

## 2017-04-28 DIAGNOSIS — M549 Dorsalgia, unspecified: Secondary | ICD-10-CM | POA: Diagnosis present

## 2017-04-28 DIAGNOSIS — Z85828 Personal history of other malignant neoplasm of skin: Secondary | ICD-10-CM | POA: Diagnosis not present

## 2017-04-28 DIAGNOSIS — J869 Pyothorax without fistula: Secondary | ICD-10-CM | POA: Diagnosis present

## 2017-04-28 DIAGNOSIS — K8000 Calculus of gallbladder with acute cholecystitis without obstruction: Secondary | ICD-10-CM | POA: Diagnosis present

## 2017-04-28 DIAGNOSIS — Z86010 Personal history of colonic polyps: Secondary | ICD-10-CM | POA: Diagnosis not present

## 2017-04-28 DIAGNOSIS — K82A1 Gangrene of gallbladder in cholecystitis: Secondary | ICD-10-CM | POA: Diagnosis present

## 2017-04-28 DIAGNOSIS — K219 Gastro-esophageal reflux disease without esophagitis: Secondary | ICD-10-CM | POA: Diagnosis present

## 2017-04-28 DIAGNOSIS — Z79899 Other long term (current) drug therapy: Secondary | ICD-10-CM | POA: Diagnosis not present

## 2017-04-28 DIAGNOSIS — Z96653 Presence of artificial knee joint, bilateral: Secondary | ICD-10-CM | POA: Diagnosis present

## 2017-04-28 DIAGNOSIS — K810 Acute cholecystitis: Secondary | ICD-10-CM | POA: Diagnosis present

## 2017-04-28 DIAGNOSIS — G8929 Other chronic pain: Secondary | ICD-10-CM | POA: Diagnosis present

## 2017-04-28 DIAGNOSIS — Z791 Long term (current) use of non-steroidal anti-inflammatories (NSAID): Secondary | ICD-10-CM | POA: Diagnosis not present

## 2017-04-28 DIAGNOSIS — E876 Hypokalemia: Secondary | ICD-10-CM | POA: Diagnosis not present

## 2017-04-28 DIAGNOSIS — K029 Dental caries, unspecified: Secondary | ICD-10-CM | POA: Diagnosis present

## 2017-04-28 DIAGNOSIS — K0889 Other specified disorders of teeth and supporting structures: Secondary | ICD-10-CM | POA: Diagnosis present

## 2017-04-28 DIAGNOSIS — Y838 Other surgical procedures as the cause of abnormal reaction of the patient, or of later complication, without mention of misadventure at the time of the procedure: Secondary | ICD-10-CM | POA: Diagnosis not present

## 2017-04-28 DIAGNOSIS — K9189 Other postprocedural complications and disorders of digestive system: Secondary | ICD-10-CM | POA: Diagnosis not present

## 2017-04-28 DIAGNOSIS — C439 Malignant melanoma of skin, unspecified: Secondary | ICD-10-CM | POA: Diagnosis present

## 2017-04-28 DIAGNOSIS — I10 Essential (primary) hypertension: Secondary | ICD-10-CM | POA: Diagnosis present

## 2017-04-28 DIAGNOSIS — F17210 Nicotine dependence, cigarettes, uncomplicated: Secondary | ICD-10-CM | POA: Diagnosis present

## 2017-04-28 HISTORY — PX: CHOLECYSTECTOMY: SHX55

## 2017-04-28 LAB — SURGICAL PCR SCREEN
MRSA, PCR: NEGATIVE
Staphylococcus aureus: NEGATIVE

## 2017-04-28 LAB — COMPREHENSIVE METABOLIC PANEL
ALBUMIN: 2.3 g/dL — AB (ref 3.5–5.0)
ALK PHOS: 110 U/L (ref 38–126)
ALT: 24 U/L (ref 17–63)
ANION GAP: 13 (ref 5–15)
AST: 37 U/L (ref 15–41)
BILIRUBIN TOTAL: 0.7 mg/dL (ref 0.3–1.2)
BUN: 12 mg/dL (ref 6–20)
CALCIUM: 7.9 mg/dL — AB (ref 8.9–10.3)
CO2: 23 mmol/L (ref 22–32)
Chloride: 104 mmol/L (ref 101–111)
Creatinine, Ser: 0.68 mg/dL (ref 0.61–1.24)
GFR calc Af Amer: >60 mL/min (ref >60)
GFR calc non Af Amer: >60 mL/min (ref >60)
GLUCOSE: 158 mg/dL — AB (ref 65–99)
Potassium: 4 mmol/L (ref 3.5–5.1)
Sodium: 140 mmol/L (ref 135–145)
Total Protein: 5.6 g/dL — ABNORMAL LOW (ref 6.5–8.1)

## 2017-04-28 LAB — URINE CULTURE: CULTURE: NO GROWTH

## 2017-04-28 LAB — CBC
HEMATOCRIT: 32.5 % — AB (ref 39.0–52.0)
HEMOGLOBIN: 11.3 g/dL — AB (ref 13.0–17.0)
MCH: 31.7 pg (ref 26.0–34.0)
MCHC: 34.8 g/dL (ref 30.0–36.0)
MCV: 91 fL (ref 78.0–100.0)
Platelets: 213 10*3/uL (ref 150–400)
RBC: 3.57 MIL/uL — ABNORMAL LOW (ref 4.22–5.81)
RDW: 13.9 % (ref 11.5–15.5)
WBC: 17.8 10*3/uL — ABNORMAL HIGH (ref 4.0–10.5)

## 2017-04-28 SURGERY — LAPAROSCOPIC CHOLECYSTECTOMY
Anesthesia: General | Site: Abdomen

## 2017-04-28 MED ORDER — SUGAMMADEX SODIUM 200 MG/2ML IV SOLN
INTRAVENOUS | Status: DC | PRN
  Administered 2017-04-28: 200 mg via INTRAVENOUS

## 2017-04-28 MED ORDER — BUPIVACAINE HCL (PF) 0.25 % IJ SOLN
INTRAMUSCULAR | Status: AC
  Filled 2017-04-28: qty 30

## 2017-04-28 MED ORDER — ONDANSETRON HCL 4 MG/2ML IJ SOLN
INTRAMUSCULAR | Status: AC
  Filled 2017-04-28: qty 2

## 2017-04-28 MED ORDER — HYDROMORPHONE HCL 2 MG/ML IJ SOLN
0.2500 mg | INTRAMUSCULAR | Status: DC | PRN
  Administered 2017-04-28 (×3): 0.5 mg via INTRAVENOUS

## 2017-04-28 MED ORDER — OXYCODONE HCL 5 MG PO TABS
ORAL_TABLET | ORAL | Status: AC
  Filled 2017-04-28: qty 2

## 2017-04-28 MED ORDER — HYDROMORPHONE HCL 2 MG/ML IJ SOLN
1.0000 mg | INTRAMUSCULAR | Status: DC | PRN
  Administered 2017-04-28 – 2017-05-01 (×2): 1 mg via INTRAVENOUS
  Filled 2017-04-28: qty 1

## 2017-04-28 MED ORDER — DEXTROSE-NACL 5-0.9 % IV SOLN
INTRAVENOUS | Status: DC
  Administered 2017-04-28 – 2017-04-30 (×2): via INTRAVENOUS

## 2017-04-28 MED ORDER — DEXAMETHASONE SODIUM PHOSPHATE 10 MG/ML IJ SOLN
INTRAMUSCULAR | Status: AC
  Filled 2017-04-28: qty 1

## 2017-04-28 MED ORDER — EPHEDRINE SULFATE-NACL 50-0.9 MG/10ML-% IV SOSY
PREFILLED_SYRINGE | INTRAVENOUS | Status: DC | PRN
  Administered 2017-04-28: 5 mg via INTRAVENOUS

## 2017-04-28 MED ORDER — FENTANYL CITRATE (PF) 250 MCG/5ML IJ SOLN
INTRAMUSCULAR | Status: AC
  Filled 2017-04-28: qty 5

## 2017-04-28 MED ORDER — MIDAZOLAM HCL 2 MG/2ML IJ SOLN
INTRAMUSCULAR | Status: AC
  Filled 2017-04-28: qty 2

## 2017-04-28 MED ORDER — ROCURONIUM BROMIDE 10 MG/ML (PF) SYRINGE
PREFILLED_SYRINGE | INTRAVENOUS | Status: AC
  Filled 2017-04-28: qty 5

## 2017-04-28 MED ORDER — ONDANSETRON HCL 4 MG/2ML IJ SOLN: 4.0000 mg | Freq: Four times a day (QID) | INTRAMUSCULAR | Status: DC | PRN

## 2017-04-28 MED ORDER — LIDOCAINE 2% (20 MG/ML) 5 ML SYRINGE
INTRAMUSCULAR | Status: DC | PRN
  Administered 2017-04-28: 60 mg via INTRAVENOUS

## 2017-04-28 MED ORDER — NICOTINE 14 MG/24HR TD PT24
14.0000 mg | MEDICATED_PATCH | Freq: Once | TRANSDERMAL | Status: AC
  Administered 2017-04-28: 14 mg via TRANSDERMAL
  Filled 2017-04-28: qty 1

## 2017-04-28 MED ORDER — 0.9 % SODIUM CHLORIDE (POUR BTL) OPTIME
TOPICAL | Status: DC | PRN
  Administered 2017-04-28: 1000 mL

## 2017-04-28 MED ORDER — ROCURONIUM BROMIDE 10 MG/ML (PF) SYRINGE
PREFILLED_SYRINGE | INTRAVENOUS | Status: DC | PRN
  Administered 2017-04-28: 30 mg via INTRAVENOUS
  Administered 2017-04-28: 10 mg via INTRAVENOUS
  Administered 2017-04-28: 20 mg via INTRAVENOUS

## 2017-04-28 MED ORDER — MUPIROCIN 2 % EX OINT
1.0000 "application " | TOPICAL_OINTMENT | Freq: Two times a day (BID) | CUTANEOUS | Status: AC
  Administered 2017-04-28 – 2017-04-30 (×4): 1 via NASAL
  Filled 2017-04-28 (×2): qty 22

## 2017-04-28 MED ORDER — HEMOSTATIC AGENTS (NO CHARGE) OPTIME
TOPICAL | Status: DC | PRN
  Administered 2017-04-28: 1 via TOPICAL

## 2017-04-28 MED ORDER — ROCURONIUM BROMIDE 10 MG/ML (PF) SYRINGE
PREFILLED_SYRINGE | INTRAVENOUS | Status: AC
  Filled 2017-04-28: qty 10

## 2017-04-28 MED ORDER — IOPAMIDOL (ISOVUE-300) INJECTION 61%
INTRAVENOUS | Status: AC
  Filled 2017-04-28: qty 50

## 2017-04-28 MED ORDER — BUPIVACAINE HCL 0.25 % IJ SOLN
INTRAMUSCULAR | Status: DC | PRN
  Administered 2017-04-28: 5 mL

## 2017-04-28 MED ORDER — SUCCINYLCHOLINE CHLORIDE 200 MG/10ML IV SOSY
PREFILLED_SYRINGE | INTRAVENOUS | Status: AC
  Filled 2017-04-28: qty 10

## 2017-04-28 MED ORDER — FENTANYL CITRATE (PF) 100 MCG/2ML IJ SOLN
INTRAMUSCULAR | Status: DC | PRN
  Administered 2017-04-28 (×3): 50 ug via INTRAVENOUS

## 2017-04-28 MED ORDER — HYDROMORPHONE HCL 2 MG/ML IJ SOLN
INTRAMUSCULAR | Status: AC
  Filled 2017-04-28: qty 1

## 2017-04-28 MED ORDER — SUCCINYLCHOLINE CHLORIDE 20 MG/ML IJ SOLN
INTRAMUSCULAR | Status: DC | PRN
  Administered 2017-04-28: 100 mg via INTRAVENOUS

## 2017-04-28 MED ORDER — SODIUM CHLORIDE 0.9 % IR SOLN
Status: DC | PRN
  Administered 2017-04-28 (×2): 1000 mL

## 2017-04-28 MED ORDER — LACTATED RINGERS IV SOLN
INTRAVENOUS | Status: DC | PRN
  Administered 2017-04-28: 08:00:00 via INTRAVENOUS

## 2017-04-28 MED ORDER — PIPERACILLIN-TAZOBACTAM 3.375 G IVPB
3.3750 g | Freq: Three times a day (TID) | INTRAVENOUS | Status: DC
  Administered 2017-04-28 – 2017-05-02 (×12): 3.375 g via INTRAVENOUS
  Filled 2017-04-28 (×13): qty 50

## 2017-04-28 MED ORDER — SUGAMMADEX SODIUM 200 MG/2ML IV SOLN
INTRAVENOUS | Status: AC
  Filled 2017-04-28: qty 2

## 2017-04-28 MED ORDER — PROPOFOL 10 MG/ML IV BOLUS
INTRAVENOUS | Status: DC | PRN
  Administered 2017-04-28: 100 mg via INTRAVENOUS
  Administered 2017-04-28: 50 mg via INTRAVENOUS

## 2017-04-28 MED ORDER — PROPOFOL 10 MG/ML IV BOLUS
INTRAVENOUS | Status: AC
  Filled 2017-04-28: qty 40

## 2017-04-28 MED ORDER — ENOXAPARIN SODIUM 40 MG/0.4ML ~~LOC~~ SOLN
40.0000 mg | SUBCUTANEOUS | Status: DC
  Administered 2017-04-29: 40 mg via SUBCUTANEOUS
  Filled 2017-04-28: qty 0.4

## 2017-04-28 MED ORDER — PHENYLEPHRINE HCL 10 MG/ML IJ SOLN: INTRAVENOUS | Status: DC | PRN

## 2017-04-28 MED ORDER — LIDOCAINE 2% (20 MG/ML) 5 ML SYRINGE
INTRAMUSCULAR | Status: AC
  Filled 2017-04-28: qty 5

## 2017-04-28 MED ORDER — ONDANSETRON 4 MG PO TBDP: 4.0000 mg | ORAL_TABLET | Freq: Four times a day (QID) | ORAL | Status: DC | PRN

## 2017-04-28 MED ORDER — EPHEDRINE 5 MG/ML INJ
INTRAVENOUS | Status: AC
  Filled 2017-04-28: qty 10

## 2017-04-28 MED ORDER — PHENYLEPHRINE 40 MCG/ML (10ML) SYRINGE FOR IV PUSH (FOR BLOOD PRESSURE SUPPORT)
PREFILLED_SYRINGE | INTRAVENOUS | Status: AC
  Filled 2017-04-28: qty 10

## 2017-04-28 MED ORDER — PHENYLEPHRINE 40 MCG/ML (10ML) SYRINGE FOR IV PUSH (FOR BLOOD PRESSURE SUPPORT)
PREFILLED_SYRINGE | INTRAVENOUS | Status: DC | PRN
  Administered 2017-04-28 (×3): 80 ug via INTRAVENOUS
  Administered 2017-04-28: 40 ug via INTRAVENOUS
  Administered 2017-04-28: 80 ug via INTRAVENOUS
  Administered 2017-04-28: 40 ug via INTRAVENOUS

## 2017-04-28 MED ORDER — OXYCODONE HCL 5 MG PO TABS
5.0000 mg | ORAL_TABLET | ORAL | Status: DC | PRN
  Administered 2017-04-28: 5 mg via ORAL
  Administered 2017-04-28 – 2017-05-02 (×12): 10 mg via ORAL
  Filled 2017-04-28 (×12): qty 2

## 2017-04-28 MED ORDER — DEXAMETHASONE SODIUM PHOSPHATE 10 MG/ML IJ SOLN
INTRAMUSCULAR | Status: DC | PRN
  Administered 2017-04-28: 10 mg via INTRAVENOUS

## 2017-04-28 MED ORDER — POTASSIUM CHLORIDE 10 MEQ/100ML IV SOLN
10.0000 meq | INTRAVENOUS | Status: AC
  Administered 2017-04-28 (×2): 10 meq via INTRAVENOUS

## 2017-04-28 SURGICAL SUPPLY — 46 items
ADH SKN CLS APL DERMABOND .7 (GAUZE/BANDAGES/DRESSINGS) ×2
BAG SPEC RTRVL 10 TROC 200 (ENDOMECHANICALS) ×2
BIOPATCH RED 1 DISK 7.0 (GAUZE/BANDAGES/DRESSINGS) ×2 IMPLANT
BIOPATCH RED 1IN DISK 7.0MM (GAUZE/BANDAGES/DRESSINGS) ×1
CANISTER SUCT 3000ML PPV (MISCELLANEOUS) ×4 IMPLANT
CHLORAPREP W/TINT 26ML (MISCELLANEOUS) ×4 IMPLANT
CLIP VESOLOCK MED LG 6/CT (CLIP) ×4 IMPLANT
COVER SURGICAL LIGHT HANDLE (MISCELLANEOUS) ×4 IMPLANT
DERMABOND ADVANCED (GAUZE/BANDAGES/DRESSINGS) ×2
DERMABOND ADVANCED .7 DNX12 (GAUZE/BANDAGES/DRESSINGS) ×2 IMPLANT
DISSECTOR BLUNT TIP ENDO 5MM (MISCELLANEOUS) ×3 IMPLANT
DRAIN CHANNEL 19F RND (DRAIN) ×3 IMPLANT
DRSG TEGADERM 2-3/8X2-3/4 SM (GAUZE/BANDAGES/DRESSINGS) ×3 IMPLANT
ELECT REM PT RETURN 9FT ADLT (ELECTROSURGICAL) ×4
ELECTRODE REM PT RTRN 9FT ADLT (ELECTROSURGICAL) ×2 IMPLANT
ENDOLOOP SUT PDS II  0 18 (SUTURE) ×4
ENDOLOOP SUT PDS II 0 18 (SUTURE) ×2 IMPLANT
EVACUATOR SILICONE 100CC (DRAIN) ×3 IMPLANT
GLOVE BIO SURGEON STRL SZ7.5 (GLOVE) ×4 IMPLANT
GOWN STRL REUS W/ TWL LRG LVL3 (GOWN DISPOSABLE) ×4 IMPLANT
GOWN STRL REUS W/ TWL XL LVL3 (GOWN DISPOSABLE) ×2 IMPLANT
GOWN STRL REUS W/TWL LRG LVL3 (GOWN DISPOSABLE) ×8
GOWN STRL REUS W/TWL XL LVL3 (GOWN DISPOSABLE) ×4
GRASPER SUT TROCAR 14GX15 (MISCELLANEOUS) ×4 IMPLANT
HEMOSTAT SNOW SURGICEL 2X4 (HEMOSTASIS) ×3 IMPLANT
IV CATH 14GX2 1/4 (CATHETERS) ×4 IMPLANT
KIT BASIN OR (CUSTOM PROCEDURE TRAY) ×4 IMPLANT
KIT TURNOVER KIT B (KITS) ×4 IMPLANT
NDL INSUFFLATION 14GA 120MM (NEEDLE) ×1 IMPLANT
NEEDLE INSUFFLATION 14GA 120MM (NEEDLE) ×4 IMPLANT
NS IRRIG 1000ML POUR BTL (IV SOLUTION) ×4 IMPLANT
PAD ARMBOARD 7.5X6 YLW CONV (MISCELLANEOUS) ×5 IMPLANT
POUCH RETRIEVAL ECOSAC 10 (ENDOMECHANICALS) ×1 IMPLANT
POUCH RETRIEVAL ECOSAC 10MM (ENDOMECHANICALS) ×2
SCISSORS LAP 5X35 DISP (ENDOMECHANICALS) ×4 IMPLANT
SET IRRIG TUBING LAPAROSCOPIC (IRRIGATION / IRRIGATOR) ×4 IMPLANT
SLEEVE ENDOPATH XCEL 5M (ENDOMECHANICALS) ×4 IMPLANT
SPECIMEN JAR SMALL (MISCELLANEOUS) ×4 IMPLANT
SUT ETHILON 2 0 FS 18 (SUTURE) ×3 IMPLANT
SUT MNCRL AB 4-0 PS2 18 (SUTURE) ×4 IMPLANT
TOWEL OR 17X24 6PK STRL BLUE (TOWEL DISPOSABLE) ×4 IMPLANT
TRAY LAPAROSCOPIC MC (CUSTOM PROCEDURE TRAY) ×4 IMPLANT
TROCAR XCEL NON-BLD 11X100MML (ENDOMECHANICALS) ×4 IMPLANT
TROCAR XCEL NON-BLD 5MMX100MML (ENDOMECHANICALS) ×4 IMPLANT
TUBING INSUFFLATION (TUBING) ×4 IMPLANT
WATER STERILE IRR 1000ML POUR (IV SOLUTION) ×4 IMPLANT

## 2017-04-28 NOTE — Anesthesia Procedure Notes (Signed)
Procedure Name: Intubation Date/Time: 04/28/2017 8:13 AM Performed by: Colin Benton, CRNA Pre-anesthesia Checklist: Patient identified, Emergency Drugs available, Suction available and Patient being monitored Patient Re-evaluated:Patient Re-evaluated prior to induction Oxygen Delivery Method: Circle system utilized Preoxygenation: Pre-oxygenation with 100% oxygen Induction Type: IV induction Ventilation: Mask ventilation without difficulty Laryngoscope Size: Miller and 2 Grade View: Grade I Tube type: Oral Tube size: 8.0 mm Number of attempts: 1 Airway Equipment and Method: Stylet Placement Confirmation: ETT inserted through vocal cords under direct vision,  positive ETCO2 and breath sounds checked- equal and bilateral Secured at: 23 cm Tube secured with: Tape Dental Injury: Teeth and Oropharynx as per pre-operative assessment

## 2017-04-28 NOTE — Anesthesia Postprocedure Evaluation (Signed)
Anesthesia Post Note  Patient: Gerald Farrell  Procedure(s) Performed: LAPAROSCOPIC CHOLECYSTECTOMY (N/A Abdomen)     Patient location during evaluation: PACU Anesthesia Type: General Level of consciousness: awake and alert Pain management: pain level controlled Vital Signs Assessment: post-procedure vital signs reviewed and stable Respiratory status: spontaneous breathing, nonlabored ventilation, respiratory function stable and patient connected to nasal cannula oxygen Cardiovascular status: blood pressure returned to baseline and stable Postop Assessment: no apparent nausea or vomiting Anesthetic complications: no    Last Vitals:  Vitals:   04/28/17 1030 04/28/17 1056  BP:  (!) 154/76  Pulse: 87 89  Resp: 17 16  Temp: 36.7 C 36.7 C  SpO2: 98% 94%    Last Pain:  Vitals:   04/28/17 1158  TempSrc:   PainSc: 8                  Mariusz Jubb,W. EDMOND

## 2017-04-28 NOTE — Progress Notes (Signed)
Day of Surgery   Subjective/Chief Complaint: Doing well this AM Con't with RUQ pain   Objective: Vital signs in last 24 hours: Temp:  [98.4 F (36.9 C)-100.3 F (37.9 C)] 100.3 F (37.9 C) (04/28 0512) Pulse Rate:  [67-102] 90 (04/28 0512) Resp:  [16-18] 17 (04/28 0512) BP: (119-157)/(64-81) 156/78 (04/28 0512) SpO2:  [94 %-98 %] 94 % (04/28 0512) Weight:  [61.2 kg (135 lb)] 61.2 kg (135 lb) (04/27 1242) Last BM Date: 04/26/17  Intake/Output from previous day: 04/27 0701 - 04/28 0700 In: 2281.7 [I.V.:2181.7; IV Piggyback:100] Out: 800 [Urine:800] Intake/Output this shift: No intake/output data recorded.   Constitutional: No acute distress, conversant, appears states age. Eyes: Anicteric sclerae, moist conjunctiva, no lid lag Lungs: Clear to auscultation bilaterally, normal respiratory effort CV: regular rate and rhythm, no murmurs, no peripheral edema, pedal pulses 2+ GI: Soft, no masses or hepatosplenomegaly, tender to palpation RUQ Skin: No rashes, palpation reveals normal turgor Psychiatric: appropriate judgment and insight, oriented to person, place, and time   Lab Results:  Recent Labs    04/26/17 1642 04/27/17 0715  WBC 18.1* 17.7*  HGB 11.8* 12.9*  HCT 33.9* 36.2*  PLT 173 183   BMET Recent Labs    04/26/17 1642 04/27/17 0715  NA 131* 134*  K 3.1* 2.8*  CL 95* 99*  CO2 20* 23  GLUCOSE 114* 106*  BUN 40* 23*  CREATININE 1.62* 1.00  CALCIUM 8.7* 8.7*   PT/INR No results for input(s): LABPROT, INR in the last 72 hours. ABG No results for input(s): PHART, HCO3 in the last 72 hours.  Invalid input(s): PCO2, PO2  Studies/Results: Ct Abdomen Pelvis W Contrast  Result Date: 04/27/2017 CLINICAL DATA:  Right inguinal pain radiating to the right shoulder. Acute generalized abdominal pain. EXAM: CT ABDOMEN AND PELVIS WITH CONTRAST TECHNIQUE: Multidetector CT imaging of the abdomen and pelvis was performed using the standard protocol following bolus  administration of intravenous contrast. CONTRAST:  129mL ISOVUE-300 IOPAMIDOL (ISOVUE-300) INJECTION 61% COMPARISON:  None. FINDINGS: Lower chest: Atelectasis or consolidation in both lung bases, greater on the right. This could indicate pneumonia. Hepatobiliary: Gallbladder is distended with mildly thickened wall and mild pericholecystic edema. No stones identified. No bile duct dilatation. This appearance is nonspecific but could indicate acute cholecystitis, chronic inflammatory process, or may be related to ascites or other infectious etiology. No focal liver lesions. Pancreas: Unremarkable. No pancreatic ductal dilatation or surrounding inflammatory changes. Spleen: Normal in size without focal abnormality. Adrenals/Urinary Tract: Adrenal glands are unremarkable. Kidneys are normal, without renal calculi, focal lesion, or hydronephrosis. Bladder is unremarkable. Stomach/Bowel: Stomach, small bowel, and colon are not abnormally distended. Stool diffusely fills the colon. No wall thickening or inflammatory changes appreciated. Appendix is normal. Vascular/Lymphatic: Aortic atherosclerosis. No enlarged abdominal or pelvic lymph nodes. Reproductive: Prostate is unremarkable. Other: Small amount of free fluid in the right upper quadrant. No free air in the abdomen. Abdominal wall musculature appears intact. Musculoskeletal: Degenerative changes in the spine. Postoperative laminectomies at L3-4 and L4-5. No destructive bone lesions. IMPRESSION: 1. Distended and thick-walled gallbladder with pericholecystic edema likely due to cholecystitis although other inflammatory or infectious changes could also have this appearance. Small amount of free fluid in the right upper quadrant is likely reactive. Consider ultrasound for further evaluation. 2. Atelectasis or consolidation in both lung bases, greater on the right possibly indicating pneumonia. 3. Aortic atherosclerosis. 4. Diffusely stool-filled colon. Electronically  Signed   By: Lucienne Capers M.D.   On: 04/27/2017 00:59  US Abdomen Limited  Result Date: 04/27/2017 CLINICAL DATA:  Right upper quadrant pain for 1 week. Abnormal gallbladder on CT. EXAM: ULTRASOUND ABDOMEN LIMITED RIGHT UPPER QUADRANT COMPARISON:  CT abdomen and pelvis 04/27/2017 FINDINGS: Gallbladder: Layering sludge in the gallbladder. 6 mm echogenic focus in the dependent gallbladder wall likely to represent a stone. Small polyp not entirely excluded. Gallbladder wall thickening and edema with wall thickness up to 6 mm. Murphy's sign is positive. Common bile duct: Diameter: 5 mm, normal Liver: No focal lesion identified. Within normal limits in parenchymal echogenicity. Small amount of fluid around the liver edge. Portal vein is patent on color Doppler imaging with normal direction of blood flow towards the liver. IMPRESSION: Sludge and probable stone in the gallbladder, gallbladder wall thickening and edema, and positive Murphy's sign compatible with acute cholecystitis in the appropriate clinical setting. Small amount of free fluid around the liver edge is probably reactive. Electronically Signed   By: Lucienne Capers M.D.   On: 04/27/2017 03:55    Anti-infectives: Anti-infectives (From admission, onward)   Start     Dose/Rate Route Frequency Ordered Stop   04/27/17 0545  cefTRIAXone (ROCEPHIN) 2 g in sodium chloride 0.9 % 100 mL IVPB     2 g 200 mL/hr over 30 Minutes Intravenous Every 24 hours 04/27/17 0538        Assessment/Plan: 72 year old male with acute cholecystitis Hypertension Chronic back pain  1. Plan to OR today for Lap chole +/_ IOC 2. All risks and benefits were discussed with the patient to generally include: infection, bleeding, possible need for post op ERCP, damage to the bile ducts, and bile leak. Alternatives were offered and described.  All questions were answered and the patient voiced understanding of the procedure and wishes to proceed at this point with a  laparoscopic cholecystectomy    LOS: 0 days    Rosario Jacks., [facility] 04/28/2017

## 2017-04-28 NOTE — Transfer of Care (Signed)
Immediate Anesthesia Transfer of Care Note  Patient: Gerald Farrell  Procedure(s) Performed: LAPAROSCOPIC CHOLECYSTECTOMY (N/A Abdomen)  Patient Location: PACU  Anesthesia Type:General  Level of Consciousness: awake, alert , oriented and patient cooperative  Airway & Oxygen Therapy: Patient Spontanous Breathing and Patient connected to face mask oxygen  Post-op Assessment: Report given to RN, Post -op Vital signs reviewed and stable and Patient moving all extremities X 4  Post vital signs: Reviewed and stable  Last Vitals:  Vitals Value Taken Time  BP 145/72 04/28/2017  9:56 AM  Temp    Pulse 87 04/28/2017  9:58 AM  Resp 20 04/28/2017  9:58 AM  SpO2 100 % 04/28/2017  9:58 AM  Vitals shown include unvalidated device data.  Last Pain:  Vitals:   04/28/17 0544  TempSrc:   PainSc: 3       Patients Stated Pain Goal: 3 (38/17/71 1657)  Complications: No apparent anesthesia complications

## 2017-04-28 NOTE — Anesthesia Preprocedure Evaluation (Addendum)
Anesthesia Evaluation  Patient identified by MRN, date of birth, ID band Patient awake    Reviewed: Allergy & Precautions, H&P , NPO status , Patient's Chart, lab work & pertinent test results, reviewed documented beta blocker date and time   Airway Mallampati: II  TM Distance: >3 FB Neck ROM: Full    Dental no notable dental hx. (+) Poor Dentition, Dental Advisory Given   Pulmonary Current Smoker,    Pulmonary exam normal breath sounds clear to auscultation       Cardiovascular hypertension, Pt. on medications and Pt. on home beta blockers  Rhythm:Regular Rate:Normal     Neuro/Psych negative neurological ROS  negative psych ROS   GI/Hepatic negative GI ROS, Neg liver ROS,   Endo/Other  negative endocrine ROS  Renal/GU negative Renal ROS  negative genitourinary   Musculoskeletal   Abdominal   Peds  Hematology negative hematology ROS (+)   Anesthesia Other Findings   Reproductive/Obstetrics negative OB ROS                            Anesthesia Physical Anesthesia Plan  ASA: II  Anesthesia Plan: General   Post-op Pain Management:    Induction: Intravenous  PONV Risk Score and Plan: 2 and Ondansetron and Dexamethasone  Airway Management Planned: Oral ETT  Additional Equipment:   Intra-op Plan:   Post-operative Plan: Extubation in OR  Informed Consent: I have reviewed the patients History and Physical, chart, labs and discussed the procedure including the risks, benefits and alternatives for the proposed anesthesia with the patient or authorized representative who has indicated his/her understanding and acceptance.   Dental advisory given  Plan Discussed with: CRNA  Anesthesia Plan Comments:         Anesthesia Quick Evaluation

## 2017-04-28 NOTE — Plan of Care (Signed)
Patient IV patent and VS stable.  Consents signed x2, all personal items removed. MRSA PCR was pending results. CHG x1 completed. Should be ready to go the the OR when time.

## 2017-04-28 NOTE — Op Note (Signed)
04/28/2017  9:42 AM  PATIENT:  Gerald Farrell  72 y.o. male  PRE-OPERATIVE DIAGNOSIS:  CHOLECYSTITIS  POST-OPERATIVE DIAGNOSIS:  ACUTE GANGRENOUS, PURLENT CHOLECYSTITIS  PROCEDURE:  Procedure(s): LAPAROSCOPIC PARTIAL CHOLECYSTECTOMY  WITH DRAIN PLACEMENT(N/A)  SURGEON:  Surgeon(s) and Role:    Ralene Ok, MD - Primary  ANESTHESIA:   local and general  EBL:  20CC   BLOOD ADMINISTERED:none  DRAINS: (66fE) Jackson-Pratt drain(s) with closed bulb suction in the subhepatic fossa   LOCAL MEDICATIONS USED:  BUPIVICAINE   SPECIMEN:  Source of Specimen:  Portion of gallbladder  DISPOSITION OF SPECIMEN:  PATHOLOGY  COUNTS:  YES  TOURNIQUET:  * No tourniquets in log *  DICTATION: .Dragon Dictation The patient was taken to the operating and placed in the supine position with bilateral SCDs in place. The patient was prepped and draped in the usual sterile fashion. A time out was called and all facts were verified. A pneumoperitoneum was obtained via A Veress needle technique to a pressure of 59mm of mercury.  A 74mm trochar was then placed in the right upper quadrant under visualization, and there were no injuries to any abdominal organs. A 11 mm port was then placed in the umbilical region after infiltrating with local anesthesia under direct visualization. A second and third epigastric port and right lower quadrant port placement under direct visualization, respectively. Upon viewing the RUQ there was significant small bowel and colonic adhesions to the liver and gallbladder.  There also appeared to be pus.  The small bowel and colon was gently and bluntly dissected away from the liver and gallbaldder taking care not to injure the viscera.     The gallbladder was identified and seen to be gangrenous and purulent with a pocket of pus in between it and the liver.  The gallbladder was decompressed to help with retraction.  It was grasped and retracted, the peritoneum was then  sharply dissected from the gallbladder and this dissection was carried down to Calot's triangle. There was an intense amount of inflammation at Calot's triangle and normal anatomy could not be seen easily.    I proceeded to remove the gallbladder in a dome down fashion with cautery to maintain hemostasis.  The gallbladder bed was very inflamed and oozing.  I dissected the gallbladder down towards the neck until I could no longer safely dissect.  At this time 2 PDS #0 Endoloops were used to ligate the neck of the gallbladder, with a portion of gallbladder neck still remaining.  A retrieval bag was then placed in the abdomen and gallbladder placed in the bag. The hepatic fossa was then reexamined and hemostasis was achieved with Bovie cautery and was excellent at the end of the case. The subhepatic fossa and perihepatic fossa was then irrigated until the effluent was clear.  A piece of snow was place in the gallbladder fossa. The gallbladder and bag were removed from the abdominal cavity.   A 19Fr blacke drain was placed in the gallbladder fossa and brought out the RLQ trocar site.  It was sutured to the abdominal wall with a 2-0 nylon x1.  The 11 mm trocar fascia was reapproximated with the Endo Close #1 Vicryl x1. The pneumoperitoneum was evacuated and all trochars removed under direct visulalization. The skin was then closed with 4-0 Monocryl and the skin dressed with Dermabond. The patient was awaken from general anesthesia and taken to the recovery room in stable condition.   PLAN OF CARE: Admit to inpatient  PATIENT DISPOSITION:  PACU - hemodynamically stable.   Delay start of Pharmacological VTE agent (>24hrs) due to surgical blood loss or risk of bleeding: yes

## 2017-04-29 ENCOUNTER — Encounter (HOSPITAL_COMMUNITY): Payer: Self-pay | Admitting: General Surgery

## 2017-04-29 LAB — COMPREHENSIVE METABOLIC PANEL
ALK PHOS: 113 U/L (ref 38–126)
ALT: 22 U/L (ref 17–63)
AST: 23 U/L (ref 15–41)
Albumin: 2.3 g/dL — ABNORMAL LOW (ref 3.5–5.0)
Anion gap: 8 (ref 5–15)
BILIRUBIN TOTAL: 1.2 mg/dL (ref 0.3–1.2)
BUN: 11 mg/dL (ref 6–20)
CO2: 27 mmol/L (ref 22–32)
CREATININE: 0.63 mg/dL (ref 0.61–1.24)
Calcium: 8.4 mg/dL — ABNORMAL LOW (ref 8.9–10.3)
Chloride: 103 mmol/L (ref 101–111)
GFR calc Af Amer: >60 mL/min (ref >60)
GLUCOSE: 115 mg/dL — AB (ref 65–99)
POTASSIUM: 3.4 mmol/L — AB (ref 3.5–5.1)
Sodium: 138 mmol/L (ref 135–145)
TOTAL PROTEIN: 5.7 g/dL — AB (ref 6.5–8.1)

## 2017-04-29 LAB — CBC
HEMATOCRIT: 33.5 % — AB (ref 39.0–52.0)
Hemoglobin: 11.5 g/dL — ABNORMAL LOW (ref 13.0–17.0)
MCH: 31.4 pg (ref 26.0–34.0)
MCHC: 34.3 g/dL (ref 30.0–36.0)
MCV: 91.5 fL (ref 78.0–100.0)
PLATELETS: 303 10*3/uL (ref 150–400)
RBC: 3.66 MIL/uL — ABNORMAL LOW (ref 4.22–5.81)
RDW: 14.3 % (ref 11.5–15.5)
WBC: 20.1 10*3/uL — ABNORMAL HIGH (ref 4.0–10.5)

## 2017-04-29 MED ORDER — POTASSIUM CHLORIDE CRYS ER 20 MEQ PO TBCR
40.0000 meq | EXTENDED_RELEASE_TABLET | Freq: Once | ORAL | Status: AC
  Administered 2017-04-29: 40 meq via ORAL
  Filled 2017-04-29: qty 2

## 2017-04-29 MED ORDER — ACETAMINOPHEN 325 MG PO TABS
650.0000 mg | ORAL_TABLET | Freq: Four times a day (QID) | ORAL | Status: DC
  Administered 2017-04-29 – 2017-05-02 (×11): 650 mg via ORAL
  Filled 2017-04-29 (×11): qty 2

## 2017-04-29 MED ORDER — NICOTINE 14 MG/24HR TD PT24
14.0000 mg | MEDICATED_PATCH | Freq: Every day | TRANSDERMAL | Status: DC
  Administered 2017-04-29 – 2017-05-02 (×4): 14 mg via TRANSDERMAL
  Filled 2017-04-29 (×4): qty 1

## 2017-04-29 NOTE — Plan of Care (Signed)
  Problem: Education: Goal: Knowledge of General Education information will improve Outcome: Progressing   Problem: Health Behavior/Discharge Planning: Goal: Ability to manage health-related needs will improve Outcome: Progressing   Problem: Clinical Measurements: Goal: Ability to maintain clinical measurements within normal limits will improve Outcome: Progressing Goal: Will remain free from infection Outcome: Progressing Goal: Diagnostic test results will improve Outcome: Progressing Goal: Respiratory complications will improve Outcome: Progressing Goal: Cardiovascular complication will be avoided Outcome: Progressing   Problem: Activity: Goal: Risk for activity intolerance will decrease Outcome: Progressing   Problem: Nutrition: Goal: Adequate nutrition will be maintained Outcome: Progressing   Problem: Coping: Goal: Level of anxiety will decrease Outcome: Progressing   Problem: Elimination: Goal: Will not experience complications related to bowel motility Outcome: Progressing Goal: Will not experience complications related to urinary retention Outcome: Progressing   Problem: Pain Managment: Goal: General experience of comfort will improve Outcome: Progressing   Problem: Safety: Goal: Ability to remain free from injury will improve Outcome: Progressing   Problem: Skin Integrity: Goal: Risk for impaired skin integrity will decrease Outcome: Progressing   Problem: Health Behavior/Discharge Planning: Goal: Ability to manage health-related needs will improve Outcome: Progressing   Problem: Clinical Measurements: Goal: Ability to maintain clinical measurements within normal limits will improve Outcome: Progressing Goal: Will remain free from infection Outcome: Progressing Goal: Diagnostic test results will improve Outcome: Progressing Goal: Respiratory complications will improve Outcome: Progressing Goal: Cardiovascular complication will be  avoided Outcome: Progressing   Problem: Activity: Goal: Risk for activity intolerance will decrease Outcome: Progressing   Problem: Nutrition: Goal: Adequate nutrition will be maintained Outcome: Progressing   Problem: Coping: Goal: Level of anxiety will decrease Outcome: Progressing   Problem: Elimination: Goal: Will not experience complications related to urinary retention Outcome: Progressing Goal: Will not experience complications related to bowel motility Outcome: Progressing   Problem: Safety: Goal: Ability to remain free from injury will improve Outcome: Progressing   Problem: Skin Integrity: Goal: Risk for impaired skin integrity will decrease Outcome: Progressing   Problem: Education: Goal: Knowledge of General Education information will improve Outcome: Progressing   Problem: Health Behavior/Discharge Planning: Goal: Ability to manage health-related needs will improve Outcome: Progressing   Problem: Clinical Measurements: Goal: Ability to maintain clinical measurements within normal limits will improve Outcome: Progressing Goal: Will remain free from infection Outcome: Progressing Goal: Diagnostic test results will improve Outcome: Progressing Goal: Respiratory complications will improve Outcome: Progressing Goal: Cardiovascular complication will be avoided Outcome: Progressing   Problem: Activity: Goal: Risk for activity intolerance will decrease Outcome: Progressing   Problem: Nutrition: Goal: Adequate nutrition will be maintained Outcome: Progressing   Problem: Coping: Goal: Level of anxiety will decrease Outcome: Progressing   Problem: Elimination: Goal: Will not experience complications related to bowel motility Outcome: Progressing Goal: Will not experience complications related to urinary retention Outcome: Progressing   Problem: Pain Managment: Goal: General experience of comfort will improve Outcome: Progressing   Problem:  Safety: Goal: Ability to remain free from injury will improve Outcome: Progressing   Problem: Skin Integrity: Goal: Risk for impaired skin integrity will decrease Outcome: Progressing

## 2017-04-29 NOTE — H&P (View-Only) (Signed)
Gerald Farrell: 4:15 PM 04/29/2017  LOS: 1 day    Referring Provider: Dr Judeth Horn Primary Care Physician: Frances Maywood his primary care at the [facility] in [facility]. Primary Gastroenterologist: GI service at the [place] in [place].    Reason for Consultation: Suspicion of bile duct leak.  IMPRESSION:   *    Presumed post lap chole bile leak.  Gallbladder surgery was complicated due to eroded condition of the gallbladder which was only partially removed.  Bile In JP drain.      PLAN:     *   ? proceed directly to ERCP versus obtaining HIDA scan to confirm leak and then proceeding to ERCP?   Dr Carlean Purl to evaluate pt.     [name]  04/29/2017, 4:15 PM Phone [place] Attending   I have taken an interval history, reviewed the chart and examined the patient. I agree with the Advanced Practitioner's note, impression and recommendations.   Difficult gallbladder removal (necrotic) and bile output in drain point to a bile leak.  I have recommended going to ERCP with stent placement in bile duct tomorrow unless drainage markedly reduces or stops overnight.  The risks and benefits as well as alternatives of endoscopic procedure(s) have been discussed and reviewed. All questions answered. The patient agrees to proceed.  I stopped enoxaparin for tomorrow but anticipate restarting after ERCP  Gatha Mayer, MD, [facility] Gastroenterology 04/29/2017 5:13 PM      HPI: AMI THORNSBERRY is a 72 y.o. male.  Past medical history of degenerative joint and spine disease.  Status post bilateral knee replacements.  Status post triple level lumbar spine laminectomy. About 3 weeks ago he had a removal of basal cell cancer from his forehead and a biopsy on his right arm turned out  to be a melanoma.  He has an upcoming appointment to deal with melanoma interim. History of colon polyps.  He has an upcoming appointment for colonoscopy in June for what he says is a for retrieval of a polyp that was underneath a fold of bowel.  S/p lap chole, subtotal cholecystectomy yesterday.  GB was gangrenous, purulent.  Today there is bile coming out of surgical drain.  Output to the drain yesterday was 120 mL's since ~ 9AM.  Today, so far it is 180ml.  Preop ultrasound and CT confirmed acute cholecystitis but normal ducts.   Other than T bili 1.3, LFTs not elevated before or since surgery.  Lipase never elevated.    WBCs up from 18 to 20 K today.  Day 2 zosyn, Day 3 Abx.  No fevers.      RUQ was onset was 5 days PTA.  Since surgery, the pain has shifted from the right upper quadrant and epigastric area into the area where the incisions were placed lower in the abdomen.  He is tolerating full liquid diet.   Past Medical History:  Diagnosis Date  . Hypertension     Past Surgical History:  Procedure Laterality Date  .  CHOLECYSTECTOMY N/A 04/28/2017   Procedure: LAPAROSCOPIC CHOLECYSTECTOMY;  Surgeon: Ralene Ok, MD;  Location: [place];  Service: General;  Laterality: N/A;  . laminectomy    . REPLACEMENT TOTAL KNEE BILATERAL      Prior to Admission medications   Medication Sig Start Date End Date Taking? Authorizing Provider  amLODipine (NORVASC) 10 MG tablet Take 5 mg by mouth daily.   Yes [provider]  carvedilol (COREG) 25 MG tablet Take 25 mg by mouth 2 (two) times daily with a meal.   Yes [provider]  cloNIDine (CATAPRES) 0.1 MG tablet Take 0.1 mg by mouth 3 (three) times daily.   Yes [provider]  diclofenac sodium (VOLTAREN) 1 % GEL Apply 2 g topically daily as needed (pain).   Yes [provider]  gabapentin (NEURONTIN) 400 MG capsule Take 800 mg by mouth 3 (three) times daily.   Yes [provider]    hydrochlorothiazide (HYDRODIURIL) 25 MG tablet Take 25 mg by mouth daily.   Yes [provider]  ibuprofen (ADVIL,MOTRIN) 200 MG tablet Take 800 mg by mouth every 6 (six) hours as needed for moderate pain.   Yes [provider]  ketoconazole (NIZORAL) 2 % cream Apply 1 application topically daily as needed for irritation.   Yes [provider]  lisinopril (PRINIVIL,ZESTRIL) 40 MG tablet Take 40 mg by mouth daily.   Yes [provider]  meloxicam (MOBIC) 15 MG tablet Take 15 mg by mouth daily.   Yes [provider]  methocarbamol (ROBAXIN) 750 MG tablet Take 750 mg by mouth 3 (three) times daily.   Yes [provider]  omeprazole (PRILOSEC) 20 MG capsule Take 20 mg by mouth daily.   Yes [provider]  oxycodone (OXY-IR) 5 MG capsule Take 5 mg by mouth 3 (three) times daily as needed for pain.   Yes [provider]  tamsulosin (FLOMAX) 0.4 MG CAPS capsule Take 0.4 mg by mouth daily.   Yes [provider]    Scheduled Meds: . acetaminophen  650 mg Oral Q6H  . amLODipine  5 mg Oral Daily  . carvedilol  25 mg Oral BID WC  . cloNIDine  0.1 mg Oral TID  . enoxaparin (LOVENOX) injection  40 mg Subcutaneous Q24H  . gabapentin  800 mg Oral TID  . hydrochlorothiazide  25 mg Oral Daily  . lisinopril  40 mg Oral Daily  . meloxicam  15 mg Oral Daily  . methocarbamol  750 mg Oral TID  . mupirocin ointment  1 application Nasal BID  . nicotine  14 mg Transdermal Daily  . pantoprazole  40 mg Oral Daily  . pneumococcal 23 valent vaccine  0.5 mL Intramuscular Tomorrow-1000  . tamsulosin  0.4 mg Oral Daily   Infusions: . dextrose 5 % and 0.9% NaCl 100 mL/hr at 04/28/17 1359  . piperacillin-tazobactam (ZOSYN)  IV 3.375 g (04/29/17 1401)   PRN Meds: HYDROmorphone (DILAUDID) injection, HYDROmorphone (DILAUDID) injection, ondansetron **OR** ondansetron (ZOFRAN) IV, oxyCODONE   Allergies as of 04/26/2017  . (No Known  Allergies)    History reviewed. No pertinent family history.   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 ENT:  No nose bleeds Pulm: Has developed a cough this morning, normally does not have this.  CV:  No palpitations, no LE edema. . No chest pain. GU:  No hematuria, no frequency GI: Generally does not have a lot of GI symptoms.  Appetite is good.  Regular bowel movements. Heme: No unusual bleeding or bruising. Transfusions: None Neuro:  No headaches, no peripheral tingling or numbness.  No history of syncope or seizures. MS: Has a lot of joint pain, especially in his spine.  He needs thoracic spine surgery but this has been delayed by other medical issues.  For his pain he takes methocarbamol, meloxicam daily and as needed oxycodone.. Derm:  No itching, no rash or sores.  Endocrine:  No sweats or chills.  No polyuria or dysuria Immunization: Did not inquire as to recent or past immunizations. Travel:  None beyond local counties in last few months.    PHYSICAL EXAM: Vital signs in last 24 hours: Vitals:   04/29/17 0455 04/29/17 1442  BP: (!) 147/70 113/72  Pulse: 70 80  Resp: 17   Temp: 98.7 F (37.1 C) 98.2 F (36.8 C)  SpO2: 95% 100%   Wt Readings from Last 3 Encounters:  04/27/17 135 lb (61.2 kg)    General: Pleasant, older white gentleman who is comfortable.  Looks somewhat chronically ill but not toxic or acutely ill. Head: Facial asymmetry or swelling. Eyes: No scleral icterus.  No conjunctival pallor.  EOMI. Ears: Not hard of hearing. Nose: No discharge, no congestion. Mouth: Oral mucosa is moist, pink, clear.  Tongue midline. Neck: No JVD, no masses, no thyromegaly. Lungs: Clear bilaterally but greatly reduced breath sounds at the bases and up about a third of the way up.  Loose sounding cough.  No dyspnea  with speech. Heart: RRR.  No MRG.  S1, S2 present. Abdomen: Soft, slightly distended.  Tender from just left of midline over into the right abdomen.  Bowel sounds present but hypoactive.  Bile in JP drain.     Rectal: Deferred Musc/Skeltl: No obvious joint redness or swelling.  Scars on his knees and lower spine consistent with previous surgeries Extremities: Slight ankle/pedal edema Neurologic:  Oriented x 3.  Moves all 4 limbs.  No tremor.  No gross deficits Skin:  No jaundice.  Lots of changes consistent with sun exposure. Nodes: No cervical adenopathy. Psych: Pleasant, calm, cooperative.  Intake/Output from previous day: 04/28 0701 - 04/29 0700 In: 4122 [P.O.:822; I.V.:3100; IV Piggyback:200] Out: 1020 [Urine:800; Drains:120; Blood:50] Intake/Output this shift: Total I/O In: 120 [P.O.:120] Out: 610 [Urine:450; Drains:160]  LAB RESULTS: Recent Labs    04/27/17 0715 04/28/17 1048 04/29/17 0802  WBC 17.7* 17.8* 20.1*  HGB 12.9* 11.3* 11.5*  HCT 36.2* 32.5* 33.5*  PLT 183 213 303   BMET Lab Results  Component Value Date   NA 138 04/29/2017   NA 140 04/28/2017   NA 134 (L) 04/27/2017   K 3.4 (L) 04/29/2017   K 4.0 04/28/2017   K 2.8 (L) 04/27/2017   CL 103 04/29/2017   CL 104 04/28/2017   CL 99 (L) 04/27/2017   CO2 27 04/29/2017   CO2 23 04/28/2017   CO2 23 04/27/2017   GLUCOSE 115 (H) 04/29/2017   GLUCOSE 158 (H) 04/28/2017   GLUCOSE 106 (H) 04/27/2017   BUN 11 04/29/2017   BUN 12 04/28/2017   BUN 23 (H) 04/27/2017   CREATININE 0.63 04/29/2017   CREATININE 0.68 04/28/2017   CREATININE 1.00 04/27/2017   CALCIUM 8.4 (L) 04/29/2017   CALCIUM 7.9 (L) 04/28/2017   CALCIUM 8.7 (L) 04/27/2017  LFT Recent Labs    04/27/17 0715 04/28/17 1048 04/29/17 0802  PROT 6.6 5.6* 5.7*  ALBUMIN 2.8* 2.3* 2.3*  AST 19 37 23  ALT 18 24 22   ALKPHOS 104 110 113  BILITOT 1.2 0.7 1.2   Lipase     Component Value Date/Time   LIPASE 14 04/26/2017 1642

## 2017-04-29 NOTE — Progress Notes (Addendum)
[name] Progress Note  1 Day Post-Op  Subjective: CC:  C/o abdominal pain worse with movement, improved this AM compared to last night. Urinating and had a BM after surgery. Denies fever, chills, nausea, or emesis. Reports a lot of drainage from [place].   Objective: Vital signs in last 24 hours: Temp:  [97.6 F (36.4 C)-98.7 F (37.1 C)] 98.7 F (37.1 C) (04/29 0455) Pulse Rate:  [70-90] 70 (04/29 0455) Resp:  [16-17] 17 (04/29 0455) BP: (131-159)/(70-79) 147/70 (04/29 0455) SpO2:  [94 %-100 %] 95 % (04/29 0455) Last BM Date: 04/28/17  Intake/Output from previous day: 04/28 0701 - 04/29 0700 In: 4122 [P.O.:822; I.V.:3100; IV Piggyback:200] Out: 37 [Urine:800; Drains:120; Blood:50] Intake/Output this shift: Total I/O In: 120 [P.O.:120] Out: 250 [Urine:250]  PE: Gen:  Alert, NAD, pleasant Card:  Regular rate and rhythm Pulm:  Normal effort Abd: Soft, appropriately tender, incisions c/d/i, +BS, JP drain with dark sanguinous drainage in bulb - bilious drainage on gown/drain dressing. JP w/ 120 cc/24h documented. Skin: warm and dry, no rashes  Psych: A&Ox3   Lab Results:  Recent Labs    04/28/17 1048 04/29/17 0802  WBC 17.8* 20.1*  HGB 11.3* 11.5*  HCT 32.5* 33.5*  PLT 213 303   BMET Recent Labs    04/28/17 1048 04/29/17 0802  NA 140 138  K 4.0 3.4*  CL 104 103  CO2 23 27  GLUCOSE 158* 115*  BUN 12 11  CREATININE 0.68 0.63  CALCIUM 7.9* 8.4*   PT/INR No results for input(s): LABPROT, INR in the last 72 hours. CMP     Component Value Date/Time   NA 138 04/29/2017 0802   K 3.4 (L) 04/29/2017 0802   CL 103 04/29/2017 0802   CO2 27 04/29/2017 0802   GLUCOSE 115 (H) 04/29/2017 0802   BUN 11 04/29/2017 0802   CREATININE 0.63 04/29/2017 0802   CALCIUM 8.4 (L) 04/29/2017 0802   PROT 5.7 (L) 04/29/2017 0802   ALBUMIN 2.3 (L) 04/29/2017 0802   AST 23 04/29/2017 0802   ALT 22 04/29/2017 0802   ALKPHOS 113 04/29/2017 0802   BILITOT 1.2  04/29/2017 0802   GFRNONAA >60 04/29/2017 0802   GFRAA >60 04/29/2017 0802   Lipase     Component Value Date/Time   LIPASE 14 04/26/2017 1642       Studies/Results: No results found.  Anti-infectives: Anti-infectives (From admission, onward)   Start     Dose/Rate Route Frequency Ordered Stop   04/28/17 1200  piperacillin-tazobactam (ZOSYN) IVPB 3.375 g     3.375 g 12.5 mL/hr over 240 Minutes Intravenous Every 8 hours 04/28/17 1103     04/27/17 0545  cefTRIAXone (ROCEPHIN) 2 g in sodium chloride 0.9 % 100 mL IVPB  Status:  Discontinued     2 g 200 mL/hr over 30 Minutes Intravenous Every 24 hours 04/27/17 0538 04/28/17 1103     Assessment/Plan Acute gangrenous, purulent cholecystitis POD#1 s/p laparoscopic cholecystectomy and drain placement 4/28 Dr. Rosendo Gros -  Afebrile, VSS, WBC 20,000, LFTs WNL -  Tolerating clears, advance to [facility] as tolerated -  Continue IV abx for gangrenous cholecystitis w/ empyema  -  Continue drain, 120 cc/24h; at risk for postoperative leak -  Mobilize/IS -  PO pain control - scheduled tylenol, 10 mg oxycodone PRN, home meds as below   HTN - continue home Norvasc, carvedilol, clonidine, HCTZ, lisinopril  Chronic pack pain - continue home meloxicam, robaxin, oxycodone, voltaren  GERD - omeprazole  FEN: clears advance to [facility] as tolerated, hypokalemia (3.4) replace PO ID: Zosyn 4/28 >> Day#2  VTE: SCD's, lovenox  Dispo: continue abx, advance diet    LOS: 1 day    Gerald Farrell , [facility] 04/29/2017, 10:32 AM Pager: [phone] Consults: [phone] Mon-Fri 7:00 am-4:30 pm Sat-Sun 7:00 am-11:30 am

## 2017-04-29 NOTE — Consult Note (Addendum)
[facility] Consult: 4:15 PM 04/29/2017  LOS: 1 day    Referring Provider: Dr Judeth Horn Primary Care Physician: Frances Maywood his primary care at the [facility] in [facility]. Primary Gastroenterologist: GI service at the [place] in [place].    Reason for Consultation: Suspicion of bile duct leak.  IMPRESSION:   *    Presumed post lap chole bile leak.  Gallbladder surgery was complicated due to eroded condition of the gallbladder which was only partially removed.  Bile In JP drain.      PLAN:     *   ? proceed directly to ERCP versus obtaining HIDA scan to confirm leak and then proceeding to ERCP?   Dr Carlean Purl to evaluate pt.     [name]  04/29/2017, 4:15 PM Phone [place] Attending   I have taken an interval history, reviewed the chart and examined the patient. I agree with the Advanced Practitioner's note, impression and recommendations.   Difficult gallbladder removal (necrotic) and bile output in drain point to a bile leak.  I have recommended going to ERCP with stent placement in bile duct tomorrow unless drainage markedly reduces or stops overnight.  The risks and benefits as well as alternatives of endoscopic procedure(s) have been discussed and reviewed. All questions answered. The patient agrees to proceed.  I stopped enoxaparin for tomorrow but anticipate restarting after ERCP  Gatha Mayer, MD, [facility] Gastroenterology 04/29/2017 5:13 PM      HPI: Gerald Farrell is a 72 y.o. male.  Past medical history of degenerative joint and spine disease.  Status post bilateral knee replacements.  Status post triple level lumbar spine laminectomy. About 3 weeks ago he had a removal of basal cell cancer from his forehead and a biopsy on his right arm turned out  to be a melanoma.  He has an upcoming appointment to deal with melanoma interim. History of colon polyps.  He has an upcoming appointment for colonoscopy in June for what he says is a for retrieval of a polyp that was underneath a fold of bowel.  S/p lap chole, subtotal cholecystectomy yesterday.  GB was gangrenous, purulent.  Today there is bile coming out of surgical drain.  Output to the drain yesterday was 120 mL's since ~ 9AM.  Today, so far it is 143ml.  Preop ultrasound and CT confirmed acute cholecystitis but normal ducts.   Other than T bili 1.3, LFTs not elevated before or since surgery.  Lipase never elevated.    WBCs up from 18 to 20 K today.  Day 2 zosyn, Day 3 Abx.  No fevers.      RUQ was onset was 5 days PTA.  Since surgery, the pain has shifted from the right upper quadrant and epigastric area into the area where the incisions were placed lower in the abdomen.  He is tolerating full liquid diet.   Past Medical History:  Diagnosis Date  . Hypertension     Past Surgical History:  Procedure Laterality Date  .  CHOLECYSTECTOMY N/A 04/28/2017   Procedure: LAPAROSCOPIC CHOLECYSTECTOMY;  Surgeon: Ralene Ok, MD;  Location: [place];  Service: General;  Laterality: N/A;  . laminectomy    . REPLACEMENT TOTAL KNEE BILATERAL      Prior to Admission medications   Medication Sig Start Date End Date Taking? Authorizing Provider  amLODipine (NORVASC) 10 MG tablet Take 5 mg by mouth daily.   Yes [provider]  carvedilol (COREG) 25 MG tablet Take 25 mg by mouth 2 (two) times daily with a meal.   Yes [provider]  cloNIDine (CATAPRES) 0.1 MG tablet Take 0.1 mg by mouth 3 (three) times daily.   Yes [provider]  diclofenac sodium (VOLTAREN) 1 % GEL Apply 2 g topically daily as needed (pain).   Yes [provider]  gabapentin (NEURONTIN) 400 MG capsule Take 800 mg by mouth 3 (three) times daily.   Yes [provider]    hydrochlorothiazide (HYDRODIURIL) 25 MG tablet Take 25 mg by mouth daily.   Yes [provider]  ibuprofen (ADVIL,MOTRIN) 200 MG tablet Take 800 mg by mouth every 6 (six) hours as needed for moderate pain.   Yes [provider]  ketoconazole (NIZORAL) 2 % cream Apply 1 application topically daily as needed for irritation.   Yes [provider]  lisinopril (PRINIVIL,ZESTRIL) 40 MG tablet Take 40 mg by mouth daily.   Yes [provider]  meloxicam (MOBIC) 15 MG tablet Take 15 mg by mouth daily.   Yes [provider]  methocarbamol (ROBAXIN) 750 MG tablet Take 750 mg by mouth 3 (three) times daily.   Yes [provider]  omeprazole (PRILOSEC) 20 MG capsule Take 20 mg by mouth daily.   Yes [provider]  oxycodone (OXY-IR) 5 MG capsule Take 5 mg by mouth 3 (three) times daily as needed for pain.   Yes [provider]  tamsulosin (FLOMAX) 0.4 MG CAPS capsule Take 0.4 mg by mouth daily.   Yes [provider]    Scheduled Meds: . acetaminophen  650 mg Oral Q6H  . amLODipine  5 mg Oral Daily  . carvedilol  25 mg Oral BID WC  . cloNIDine  0.1 mg Oral TID  . enoxaparin (LOVENOX) injection  40 mg Subcutaneous Q24H  . gabapentin  800 mg Oral TID  . hydrochlorothiazide  25 mg Oral Daily  . lisinopril  40 mg Oral Daily  . meloxicam  15 mg Oral Daily  . methocarbamol  750 mg Oral TID  . mupirocin ointment  1 application Nasal BID  . nicotine  14 mg Transdermal Daily  . pantoprazole  40 mg Oral Daily  . pneumococcal 23 valent vaccine  0.5 mL Intramuscular Tomorrow-1000  . tamsulosin  0.4 mg Oral Daily   Infusions: . dextrose 5 % and 0.9% NaCl 100 mL/hr at 04/28/17 1359  . piperacillin-tazobactam (ZOSYN)  IV 3.375 g (04/29/17 1401)   PRN Meds: HYDROmorphone (DILAUDID) injection, HYDROmorphone (DILAUDID) injection, ondansetron **OR** ondansetron (ZOFRAN) IV, oxyCODONE   Allergies as of 04/26/2017  . (No Known  Allergies)    History reviewed. No pertinent family history.   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 ENT:  No nose bleeds Pulm: Has developed a cough this morning, normally does not have this.  CV:  No palpitations, no LE edema. . No chest pain. GU:  No hematuria, no frequency GI: Generally does not have a lot of GI symptoms.  Appetite is good.  Regular bowel movements. Heme: No unusual bleeding or bruising. Transfusions: None Neuro:  No headaches, no peripheral tingling or numbness.  No history of syncope or seizures. MS: Has a lot of joint pain, especially in his spine.  He needs thoracic spine surgery but this has been delayed by other medical issues.  For his pain he takes methocarbamol, meloxicam daily and as needed oxycodone.. Derm:  No itching, no rash or sores.  Endocrine:  No sweats or chills.  No polyuria or dysuria Immunization: Did not inquire as to recent or past immunizations. Travel:  None beyond local counties in last few months.    PHYSICAL EXAM: Vital signs in last 24 hours: Vitals:   04/29/17 0455 04/29/17 1442  BP: (!) 147/70 113/72  Pulse: 70 80  Resp: 17   Temp: 98.7 F (37.1 C) 98.2 F (36.8 C)  SpO2: 95% 100%   Wt Readings from Last 3 Encounters:  04/27/17 135 lb (61.2 kg)    General: Pleasant, older white gentleman who is comfortable.  Looks somewhat chronically ill but not toxic or acutely ill. Head: Facial asymmetry or swelling. Eyes: No scleral icterus.  No conjunctival pallor.  EOMI. Ears: Not hard of hearing. Nose: No discharge, no congestion. Mouth: Oral mucosa is moist, pink, clear.  Tongue midline. Neck: No JVD, no masses, no thyromegaly. Lungs: Clear bilaterally but greatly reduced breath sounds at the bases and up about a third of the way up.  Loose sounding cough.  No dyspnea  with speech. Heart: RRR.  No MRG.  S1, S2 present. Abdomen: Soft, slightly distended.  Tender from just left of midline over into the right abdomen.  Bowel sounds present but hypoactive.  Bile in JP drain.     Rectal: Deferred Musc/Skeltl: No obvious joint redness or swelling.  Scars on his knees and lower spine consistent with previous surgeries Extremities: Slight ankle/pedal edema Neurologic:  Oriented x 3.  Moves all 4 limbs.  No tremor.  No gross deficits Skin:  No jaundice.  Lots of changes consistent with sun exposure. Nodes: No cervical adenopathy. Psych: Pleasant, calm, cooperative.  Intake/Output from previous day: 04/28 0701 - 04/29 0700 In: 4122 [P.O.:822; I.V.:3100; IV Piggyback:200] Out: 1020 [Urine:800; Drains:120; Blood:50] Intake/Output this shift: Total I/O In: 120 [P.O.:120] Out: 610 [Urine:450; Drains:160]  LAB RESULTS: Recent Labs    04/27/17 0715 04/28/17 1048 04/29/17 0802  WBC 17.7* 17.8* 20.1*  HGB 12.9* 11.3* 11.5*  HCT 36.2* 32.5* 33.5*  PLT 183 213 303   BMET Lab Results  Component Value Date   NA 138 04/29/2017   NA 140 04/28/2017   NA 134 (L) 04/27/2017   K 3.4 (L) 04/29/2017   K 4.0 04/28/2017   K 2.8 (L) 04/27/2017   CL 103 04/29/2017   CL 104 04/28/2017   CL 99 (L) 04/27/2017   CO2 27 04/29/2017   CO2 23 04/28/2017   CO2 23 04/27/2017   GLUCOSE 115 (H) 04/29/2017   GLUCOSE 158 (H) 04/28/2017   GLUCOSE 106 (H) 04/27/2017   BUN 11 04/29/2017   BUN 12 04/28/2017   BUN 23 (H) 04/27/2017   CREATININE 0.63 04/29/2017   CREATININE 0.68 04/28/2017   CREATININE 1.00 04/27/2017   CALCIUM 8.4 (L) 04/29/2017   CALCIUM 7.9 (L) 04/28/2017   CALCIUM 8.7 (L) 04/27/2017  LFT Recent Labs    04/27/17 0715 04/28/17 1048 04/29/17 0802  PROT 6.6 5.6* 5.7*  ALBUMIN 2.8* 2.3* 2.3*  AST 19 37 23  ALT 18 24 22   ALKPHOS 104 110 113  BILITOT 1.2 0.7 1.2   Lipase     Component Value Date/Time   LIPASE 14 04/26/2017 1642

## 2017-04-30 ENCOUNTER — Encounter (HOSPITAL_COMMUNITY): Admission: EM | Disposition: A | Discharge status: Home / Self Care | Payer: Self-pay | Source: Home / Self Care

## 2017-04-30 ENCOUNTER — Inpatient Hospital Stay (HOSPITAL_COMMUNITY): Payer: Medicare Other | Admitting: Critical Care Medicine

## 2017-04-30 ENCOUNTER — Encounter (HOSPITAL_COMMUNITY): Payer: Self-pay | Admitting: Critical Care Medicine

## 2017-04-30 DIAGNOSIS — K839 Disease of biliary tract, unspecified: Secondary | ICD-10-CM

## 2017-04-30 DIAGNOSIS — K838 Other specified diseases of biliary tract: Secondary | ICD-10-CM

## 2017-04-30 HISTORY — PX: ERCP: SHX5425

## 2017-04-30 LAB — COMPREHENSIVE METABOLIC PANEL
ALBUMIN: 1.8 g/dL — AB (ref 3.5–5.0)
ALT: 18 U/L (ref 17–63)
ANION GAP: 8 (ref 5–15)
AST: 17 U/L (ref 15–41)
Alkaline Phosphatase: 145 U/L — ABNORMAL HIGH (ref 38–126)
BILIRUBIN TOTAL: 0.9 mg/dL (ref 0.3–1.2)
BUN: 10 mg/dL (ref 6–20)
CO2: 28 mmol/L (ref 22–32)
Calcium: 8 mg/dL — ABNORMAL LOW (ref 8.9–10.3)
Chloride: 103 mmol/L (ref 101–111)
Creatinine, Ser: 0.66 mg/dL (ref 0.61–1.24)
GFR calc non Af Amer: >60 mL/min (ref >60)
GLUCOSE: 111 mg/dL — AB (ref 65–99)
POTASSIUM: 3.1 mmol/L — AB (ref 3.5–5.1)
SODIUM: 139 mmol/L (ref 135–145)
TOTAL PROTEIN: 4.5 g/dL — AB (ref 6.5–8.1)

## 2017-04-30 LAB — CBC
HCT: 26.8 % — ABNORMAL LOW (ref 39.0–52.0)
Hemoglobin: 9.2 g/dL — ABNORMAL LOW (ref 13.0–17.0)
MCH: 31.4 pg (ref 26.0–34.0)
MCHC: 34.3 g/dL (ref 30.0–36.0)
MCV: 91.5 fL (ref 78.0–100.0)
Platelets: 279 10*3/uL (ref 150–400)
RBC: 2.93 MIL/uL — ABNORMAL LOW (ref 4.22–5.81)
RDW: 14.3 % (ref 11.5–15.5)
WBC: 14.8 10*3/uL — ABNORMAL HIGH (ref 4.0–10.5)

## 2017-04-30 SURGERY — ERCP, WITH INTERVENTION IF INDICATED
Anesthesia: General

## 2017-04-30 MED ORDER — EPHEDRINE SULFATE-NACL 50-0.9 MG/10ML-% IV SOSY
PREFILLED_SYRINGE | INTRAVENOUS | Status: DC | PRN
  Administered 2017-04-30: 5 mg via INTRAVENOUS
  Administered 2017-04-30: 10 mg via INTRAVENOUS
  Administered 2017-04-30: 5 mg via INTRAVENOUS
  Administered 2017-04-30: 10 mg via INTRAVENOUS
  Administered 2017-04-30: 20 mg via INTRAVENOUS

## 2017-04-30 MED ORDER — SUCCINYLCHOLINE CHLORIDE 200 MG/10ML IV SOSY
PREFILLED_SYRINGE | INTRAVENOUS | Status: DC | PRN
  Administered 2017-04-30: 120 mg via INTRAVENOUS

## 2017-04-30 MED ORDER — POTASSIUM CHLORIDE CRYS ER 20 MEQ PO TBCR
20.0000 meq | EXTENDED_RELEASE_TABLET | Freq: Two times a day (BID) | ORAL | Status: DC
  Administered 2017-05-01 – 2017-05-02 (×3): 20 meq via ORAL
  Filled 2017-04-30 (×3): qty 1

## 2017-04-30 MED ORDER — ONDANSETRON HCL 4 MG/2ML IJ SOLN
INTRAMUSCULAR | Status: DC | PRN
  Administered 2017-04-30: 4 mg via INTRAVENOUS

## 2017-04-30 MED ORDER — FENTANYL CITRATE (PF) 250 MCG/5ML IJ SOLN
INTRAMUSCULAR | Status: DC | PRN
  Administered 2017-04-30 (×2): 50 ug via INTRAVENOUS

## 2017-04-30 MED ORDER — LACTATED RINGERS IV SOLN
INTRAVENOUS | Status: AC | PRN
  Administered 2017-04-30: 1000 mL via INTRAVENOUS

## 2017-04-30 MED ORDER — PHENYLEPHRINE HCL 10 MG/ML IJ SOLN
INTRAVENOUS | Status: DC | PRN
  Administered 2017-04-30: 50 ug/min via INTRAVENOUS

## 2017-04-30 MED ORDER — LACTATED RINGERS IV SOLN
INTRAVENOUS | Status: DC
  Administered 2017-04-30: 13:00:00 via INTRAVENOUS

## 2017-04-30 MED ORDER — IOPAMIDOL (ISOVUE-300) INJECTION 61%
INTRAVENOUS | Status: AC
  Filled 2017-04-30: qty 50

## 2017-04-30 MED ORDER — PROPOFOL 10 MG/ML IV BOLUS
INTRAVENOUS | Status: DC | PRN
  Administered 2017-04-30: 30 mg via INTRAVENOUS
  Administered 2017-04-30: 150 mg via INTRAVENOUS

## 2017-04-30 MED ORDER — INDOMETHACIN 50 MG RE SUPP
RECTAL | Status: DC | PRN
  Administered 2017-04-30 (×2): 50 mg via RECTAL

## 2017-04-30 MED ORDER — LIDOCAINE 2% (20 MG/ML) 5 ML SYRINGE
INTRAMUSCULAR | Status: DC | PRN
  Administered 2017-04-30: 80 mg via INTRAVENOUS

## 2017-04-30 MED ORDER — MIDAZOLAM HCL 5 MG/5ML IJ SOLN
INTRAMUSCULAR | Status: DC | PRN
  Administered 2017-04-30 (×2): 1 mg via INTRAVENOUS

## 2017-04-30 MED ORDER — VASOPRESSIN 20 UNIT/ML IV SOLN
INTRAVENOUS | Status: DC | PRN
  Administered 2017-04-30 (×3): 1 [IU] via INTRAVENOUS

## 2017-04-30 MED ORDER — INDOMETHACIN 50 MG RE SUPP
RECTAL | Status: AC
  Filled 2017-04-30: qty 2

## 2017-04-30 MED ORDER — SODIUM CHLORIDE 0.9 % IV SOLN
INTRAVENOUS | Status: DC | PRN
  Administered 2017-04-30: 10 mL

## 2017-04-30 MED ORDER — PHENYLEPHRINE 40 MCG/ML (10ML) SYRINGE FOR IV PUSH (FOR BLOOD PRESSURE SUPPORT)
PREFILLED_SYRINGE | INTRAVENOUS | Status: DC | PRN
  Administered 2017-04-30 (×2): 40 ug via INTRAVENOUS
  Administered 2017-04-30: 120 ug via INTRAVENOUS
  Administered 2017-04-30: 200 ug via INTRAVENOUS

## 2017-04-30 MED ORDER — INDOMETHACIN 50 MG RE SUPP: 100.0000 mg | Freq: Once | RECTAL | Status: DC

## 2017-04-30 MED ORDER — POTASSIUM CHLORIDE 2 MEQ/ML IV SOLN
INTRAVENOUS | Status: DC
  Administered 2017-04-30 – 2017-05-01 (×2): via INTRAVENOUS
  Filled 2017-04-30 (×4): qty 1000

## 2017-04-30 MED ORDER — DEXAMETHASONE SODIUM PHOSPHATE 10 MG/ML IJ SOLN
INTRAMUSCULAR | Status: DC | PRN
  Administered 2017-04-30: 4 mg via INTRAVENOUS

## 2017-04-30 MED ORDER — GLUCAGON HCL RDNA (DIAGNOSTIC) 1 MG IJ SOLR
INTRAMUSCULAR | Status: AC
  Filled 2017-04-30: qty 1

## 2017-04-30 MED ORDER — SODIUM CHLORIDE 0.9 % IV SOLN: INTRAVENOUS | Status: DC

## 2017-04-30 NOTE — Transfer of Care (Signed)
Immediate Anesthesia Transfer of Care Note  Patient: Gerald Farrell  Procedure(s) Performed: ENDOSCOPIC RETROGRADE CHOLANGIOPANCREATOGRAPHY (ERCP) (N/A )  Patient Location: Endoscopy Unit  Anesthesia Type:General  Level of Consciousness: awake, alert  and oriented  Airway & Oxygen Therapy: Patient Spontanous Breathing and Patient connected to nasal cannula oxygen  Post-op Assessment: Report given to RN, Post -op Vital signs reviewed and stable and Patient moving all extremities X 4  Post vital signs: Reviewed and stable  Last Vitals:  Vitals Value Taken Time  BP 122/39 04/30/2017  1:55 PM  Temp    Pulse 72 04/30/2017  1:56 PM  Resp 14 04/30/2017  1:56 PM  SpO2 97 % 04/30/2017  1:56 PM  Vitals shown include unvalidated device data.  Last Pain:  Vitals:   04/30/17 1207  TempSrc: Oral  PainSc: 6       Patients Stated Pain Goal: 6 (21/58/72 7618)  Complications: No apparent anesthesia complications

## 2017-04-30 NOTE — Op Note (Signed)
[facility] Patient Name: Gerald Farrell Procedure Date : 04/30/2017 MRN: [id number] Attending MD: Gatha Mayer , MD Date of Birth: [birth year] CSN: [id number] Age: 72 Admit Type: Inpatient Procedure:                ERCP Indications:              Bile leak Providers:                Gatha Mayer, MD, Burtis Junes, RN, Elspeth Cho                            Tech., Technician, Merrilyn Puma, CRNA Referring MD:              Medicines:                General Anesthesia Complications:            No immediate complications. Estimated Blood Loss:     Estimated blood loss: none. Procedure:                Pre-Anesthesia Assessment:                           - Prior to the procedure, a History and Physical                            was performed, and patient medications and                            allergies were reviewed. The patient's tolerance of                            previous anesthesia was also reviewed. The risks                            and benefits of the procedure and the sedation                            options and risks were discussed with the patient.                            All questions were answered, and informed consent                            was obtained. Prior Anticoagulants: The patient                            last took Lovenox (enoxaparin) 1 day prior to the                            procedure. ASA Grade Assessment: II - A patient                            with mild systemic disease. After reviewing the  risks and benefits, the patient was deemed in                            satisfactory condition to undergo the procedure.                           After obtaining informed consent, the scope was                            passed under direct vision. Throughout the                            procedure, the patient's blood pressure, pulse, and                            oxygen saturations were monitored  continuously. The                            [id number] scope was introduced through the                            mouth, and used to inject contrast into and used to                            inject contrast into the bile duct. The ERCP was                            accomplished without difficulty. The patient                            tolerated the procedure well. Scope In: Scope Out: Findings:      Scout film - drain in RUQ      Bile duct cannulated with wire and sphincterotome - large leak at cystic       duct gallbledder neck remnant.      Normal biliary tree otherwise s/p cholecystectomy.      Attempted 5 cm 10 Fr plastic stent placement - papilla orifice would not       allow entry so biliary sphincterotomy performed over wire and then stent       successfully placed. Wanted largest caliber plastic stent available.      Some ? filling defects at one point that looked like gas/air bubbles to       me. No stone debris seen after sphincterotomy. Impression:               - A bile leak was found. Recommendation:           - Return patient to hospital ward for ongoing care.                           - Clears today and if ok tomorow advance diet                           Continue Abx - Zosyn  OK to resume Lovenox tomorrow AM (DVT prohylaxis)                           Watch drain output - hopefully will decrease after                            this intervention                           Will follow - will need stent removal 2-3 mos if                            all goes well Procedure Code(s):        --- Professional ---                           [phone], Endoscopic retrograde                            cholangiopancreatography (ERCP); with placement of                            endoscopic stent into biliary or pancreatic duct,                            including pre- and post-dilation and guide wire                            passage, when  performed, including sphincterotomy,                            when performed, each stent Diagnosis Code(s):        --- Professional ---                           K83.9, Disease of biliary tract, unspecified                           K83.8, Other specified diseases of biliary tract CPT copyright 2017 [employer]. All rights reserved. The codes documented in this report are preliminary and upon coder review may  be revised to meet current compliance requirements. Gatha Mayer, MD 04/30/2017 2:01:42 PM This report has been signed electronically. Number of Addenda: 0

## 2017-04-30 NOTE — Anesthesia Postprocedure Evaluation (Signed)
Anesthesia Post Note  Patient: Gerald Farrell  Procedure(s) Performed: ENDOSCOPIC RETROGRADE CHOLANGIOPANCREATOGRAPHY (ERCP) (N/A )     Patient location during evaluation: Endoscopy Anesthesia Type: General Level of consciousness: awake and alert Pain management: pain level controlled Vital Signs Assessment: post-procedure vital signs reviewed and stable Respiratory status: spontaneous breathing, nonlabored ventilation, respiratory function stable and patient connected to nasal cannula oxygen Cardiovascular status: blood pressure returned to baseline and stable Postop Assessment: no apparent nausea or vomiting Anesthetic complications: no    Last Vitals:  Vitals:   04/30/17 1420 04/30/17 1425  BP:  (!) 123/48  Pulse: 80 80  Resp: 15 11  Temp:  37.1 C  SpO2: 93% 93%    Last Pain:  Vitals:   04/30/17 1425  TempSrc: Oral  PainSc: 0-No pain                 Catalina Gravel

## 2017-04-30 NOTE — Anesthesia Preprocedure Evaluation (Signed)
Anesthesia Evaluation  Patient identified by MRN, date of birth, ID band Patient awake    Reviewed: Allergy & Precautions, H&P , NPO status , Patient's Chart, lab work & pertinent test results, reviewed documented beta blocker date and time   Airway Mallampati: II  TM Distance: >3 FB Neck ROM: Full    Dental no notable dental hx. (+) Poor Dentition, Dental Advisory Given   Pulmonary Current Smoker,    Pulmonary exam normal breath sounds clear to auscultation       Cardiovascular hypertension, Pt. on medications and Pt. on home beta blockers  Rhythm:Regular Rate:Normal     Neuro/Psych negative neurological ROS  negative psych ROS   GI/Hepatic negative GI ROS, Neg liver ROS,   Endo/Other  negative endocrine ROS  Renal/GU negative Renal ROS  negative genitourinary   Musculoskeletal   Abdominal   Peds  Hematology negative hematology ROS (+)   Anesthesia Other Findings   Reproductive/Obstetrics negative OB ROS                             Anesthesia Physical  Anesthesia Plan  ASA: II  Anesthesia Plan: General   Post-op Pain Management:    Induction: Intravenous  PONV Risk Score and Plan: 2 and Ondansetron, Midazolam and Treatment may vary due to age or medical condition  Airway Management Planned: Oral ETT  Additional Equipment:   Intra-op Plan:   Post-operative Plan: Extubation in OR  Informed Consent: I have reviewed the patients History and Physical, chart, labs and discussed the procedure including the risks, benefits and alternatives for the proposed anesthesia with the patient or authorized representative who has indicated his/her understanding and acceptance.   Dental advisory given  Plan Discussed with: CRNA  Anesthesia Plan Comments:         Anesthesia Quick Evaluation

## 2017-04-30 NOTE — Interval H&P Note (Signed)
History and Physical Interval Note:  04/30/2017 12:33 PM  Gerald Farrell  has presented today for surgery, with the diagnosis of Bile leak  The various methods of treatment have been discussed with the patient and family. After consideration of risks, benefits and other options for treatment, the patient has consented to  Procedure(s): ENDOSCOPIC RETROGRADE CHOLANGIOPANCREATOGRAPHY (ERCP) (N/A) as a surgical intervention .  The patient's history has been reviewed, patient examined, no change in status, stable for surgery.  I have reviewed the patient's chart and labs.  Questions were answered to the patient's satisfaction.     Silvano Rusk

## 2017-04-30 NOTE — Progress Notes (Signed)
[facility]/Trauma Progress Note  2 Days Post-Op   Assessment/Plan HTN - continue home Norvasc, carvedilol, clonidine, HCTZ, lisinopril  Chronic pack pain - continue home meloxicam, robaxin, oxycodone, voltaren  GERD - omeprazole   Acute gangrenous, purulent cholecystitis - s/p laparoscopic cholecystectomy and drain placement 4/28 Dr. Rosendo Gros -  Continue IV abx for gangrenous cholecystitis w/ empyema  -  Continue drain -  Mobilize/IS -  PO pain control - scheduled tylenol, 10 mg oxycodone PRN, home meds as below   FEN: NPO, clears after ERCP, hypokalemia (3.1) replace IV for now until tolerating diet after ERCP ID: Zosyn 4/28 >>   VTE: SCD's, lovenox   Dispo: continue abx, advance diet, ERCP today for bile leak    LOS: 2 days    Subjective: CC: abdominal pain  Pt states persistent RUQ abdominal pain. He is not using his IS often. No SOB but he thinks pain is preventing him from taking deep breaths. O2 sats are 97% on RA according to the nurse. No nausea or vomiting.   Objective: Vital signs in last 24 hours: Temp:  [98.2 F (36.8 C)-98.3 F (36.8 C)] 98.3 F (36.8 C) (04/30 0544) Pulse Rate:  [76-89] 89 (04/30 0828) Resp:  [17] 17 (04/30 0544) BP: (110-153)/(65-85) 110/85 (04/30 0828) SpO2:  [100 %] 100 % (04/30 0544) Last BM Date: 04/29/17  Intake/Output from previous day: 04/29 0701 - 04/30 0700 In: 2915 [P.O.:180; I.V.:2635; IV Piggyback:100] Out: 1660 [Urine:1150; Drains:510] Intake/Output this shift: Total I/O In: 335 [I.V.:335] Out: 60 [Drains:60]  PE: Gen:  Alert, NAD, pleasant Card:  Regular rate and rhythm Pulm:  mild rales of RLL, rate and effort normal Abd: Soft, TTP of RUQ without guarding, incisions c/d/i, +BS, JP drain with dark sanguinous drainage in bulb JP w/ 510 cc/24h documented. Skin: warm and dry, no rashes  Psych: A&Ox3   Anti-infectives: Anti-infectives (From admission, onward)   Start     Dose/Rate Route Frequency  Ordered Stop   04/28/17 1200  piperacillin-tazobactam (ZOSYN) IVPB 3.375 g     3.375 g 12.5 mL/hr over 240 Minutes Intravenous Every 8 hours 04/28/17 1103     04/27/17 0545  cefTRIAXone (ROCEPHIN) 2 g in sodium chloride 0.9 % 100 mL IVPB  Status:  Discontinued     2 g 200 mL/hr over 30 Minutes Intravenous Every 24 hours 04/27/17 0538 04/28/17 1103      Lab Results:  Recent Labs    04/29/17 0802 04/30/17 0336  WBC 20.1* 14.8*  HGB 11.5* 9.2*  HCT 33.5* 26.8*  PLT 303 279   BMET Recent Labs    04/29/17 0802 04/30/17 0336  NA 138 139  K 3.4* 3.1*  CL 103 103  CO2 27 28  GLUCOSE 115* 111*  BUN 11 10  CREATININE 0.63 0.66  CALCIUM 8.4* 8.0*   PT/INR No results for input(s): LABPROT, INR in the last 72 hours. CMP     Component Value Date/Time   NA 139 04/30/2017 0336   K 3.1 (L) 04/30/2017 0336   CL 103 04/30/2017 0336   CO2 28 04/30/2017 0336   GLUCOSE 111 (H) 04/30/2017 0336   BUN 10 04/30/2017 0336   CREATININE 0.66 04/30/2017 0336   CALCIUM 8.0 (L) 04/30/2017 0336   PROT 4.5 (L) 04/30/2017 0336   ALBUMIN 1.8 (L) 04/30/2017 0336   AST 17 04/30/2017 0336   ALT 18 04/30/2017 0336   ALKPHOS 145 (H) 04/30/2017 0336   BILITOT 0.9 04/30/2017 0336   GFRNONAA >  60 04/30/2017 0336   GFRAA >60 04/30/2017 0336   Lipase     Component Value Date/Time   LIPASE 14 04/26/2017 1642    Studies/Results: No results found.    Kalman Drape , [facility] Surgery 04/30/2017, 11:05 AM  Pager: [phone] Mon-Wed, Friday 7:00am-4:30pm Thurs 7am-11:30am  Consults: [phone]

## 2017-04-30 NOTE — Anesthesia Procedure Notes (Signed)
Procedure Name: Intubation Date/Time: 04/30/2017 12:50 PM Performed by: Wilburn Cornelia, CRNA Pre-anesthesia Checklist: Patient identified, Emergency Drugs available, Suction available, Patient being monitored and Timeout performed Patient Re-evaluated:Patient Re-evaluated prior to induction Oxygen Delivery Method: Circle system utilized Preoxygenation: Pre-oxygenation with 100% oxygen Induction Type: IV induction Ventilation: Mask ventilation without difficulty Laryngoscope Size: Mac and 4 Grade View: Grade I Tube type: Oral Tube size: 7.5 mm Number of attempts: 1 Airway Equipment and Method: Stylet Placement Confirmation: ETT inserted through vocal cords under direct vision,  positive ETCO2,  CO2 detector and breath sounds checked- equal and bilateral Secured at: 23 cm Tube secured with: Tape Dental Injury: Teeth and Oropharynx as per pre-operative assessment

## 2017-05-01 ENCOUNTER — Encounter (HOSPITAL_COMMUNITY): Payer: Self-pay | Admitting: Internal Medicine

## 2017-05-01 LAB — COMPREHENSIVE METABOLIC PANEL
ALBUMIN: 1.8 g/dL — AB (ref 3.5–5.0)
ALT: 19 U/L (ref 17–63)
ANION GAP: 6 (ref 5–15)
AST: 18 U/L (ref 15–41)
Alkaline Phosphatase: 171 U/L — ABNORMAL HIGH (ref 38–126)
BUN: 9 mg/dL (ref 6–20)
CHLORIDE: 107 mmol/L (ref 101–111)
CO2: 27 mmol/L (ref 22–32)
Calcium: 7.9 mg/dL — ABNORMAL LOW (ref 8.9–10.3)
Creatinine, Ser: 0.61 mg/dL (ref 0.61–1.24)
GFR calc non Af Amer: >60 mL/min (ref >60)
GLUCOSE: 131 mg/dL — AB (ref 65–99)
POTASSIUM: 4.1 mmol/L (ref 3.5–5.1)
SODIUM: 140 mmol/L (ref 135–145)
Total Bilirubin: 1 mg/dL (ref 0.3–1.2)
Total Protein: 5.3 g/dL — ABNORMAL LOW (ref 6.5–8.1)

## 2017-05-01 LAB — CBC
HEMATOCRIT: 26.1 % — AB (ref 39.0–52.0)
HEMOGLOBIN: 8.8 g/dL — AB (ref 13.0–17.0)
MCH: 31.2 pg (ref 26.0–34.0)
MCHC: 33.7 g/dL (ref 30.0–36.0)
MCV: 92.6 fL (ref 78.0–100.0)
Platelets: 312 10*3/uL (ref 150–400)
RBC: 2.82 MIL/uL — AB (ref 4.22–5.81)
RDW: 14 % (ref 11.5–15.5)
WBC: 13.6 10*3/uL — ABNORMAL HIGH (ref 4.0–10.5)

## 2017-05-01 LAB — LIPASE, BLOOD: LIPASE: 36 U/L (ref 11–51)

## 2017-05-01 MED ORDER — HYDROMORPHONE HCL 2 MG/ML IJ SOLN
0.5000 mg | INTRAMUSCULAR | Status: DC | PRN
  Administered 2017-05-02: 0.5 mg via INTRAVENOUS
  Filled 2017-05-01: qty 1

## 2017-05-01 MED ORDER — KCL IN DEXTROSE-NACL 40-5-0.9 MEQ/L-%-% IV SOLN
INTRAVENOUS | Status: DC
  Filled 2017-05-01: qty 1000

## 2017-05-01 NOTE — Progress Notes (Signed)
[facility]/Trauma Progress Note  1 Day Post-Op   Assessment/Plan HTN - continue home Norvasc, carvedilol, clonidine, HCTZ, lisinopril  Chronic pack pain- continue home meloxicam, robaxin, oxycodone, voltaren  GERD- omeprazole   Acute gangrenous, purulent cholecystitis - s/p laparoscopic cholecystectomy and drain placement 4/28 Dr. Rosendo Gros - Continue IV abx for gangrenous cholecystitis w/ empyema  - Continue drain - S/P ERCP with stent placement, 04/30 for bile leak  FEN: reg diet ID: Zosyn 4/28 >>    VTE: SCD's, lovenox Foley: none Follow up: Dr. Rosendo Gros in one week, GI   Dispo: continue abx, advance diet, possibly home tomorrow    LOS: 3 days    Subjective: CC: abdominal pain  Pain slightly improved. No nausea or vomiting. Having flatus. Tolerated clears. No new complaints. No issues overnight. No family at bedside. Drain output 15cc in last 12 hours.   Objective: Vital signs in last 24 hours: Temp:  [97.7 F (36.5 C)-99.3 F (37.4 C)] 97.7 F (36.5 C) (05/01 0510) Pulse Rate:  [67-89] 70 (05/01 0510) Resp:  [11-19] 18 (05/01 0510) BP: (109-156)/(38-85) 135/70 (05/01 0510) SpO2:  [93 %-100 %] 100 % (05/01 0510) Weight:  [61.2 kg (135 lb)] 61.2 kg (135 lb) (04/30 1207) Last BM Date: 04/29/17  Intake/Output from previous day: 04/30 0701 - 05/01 0700 In: 2171.7 [I.V.:2071.7; IV Piggyback:100] Out: 135 [Drains:135] Intake/Output this shift: No intake/output data recorded.  PE: Gen: Alert, NAD, pleasant Card: Regular rate and rhythm Pulm: CTA b/l, rate and effort normal Abd: Soft,TTP of RUQ without guarding, incisions c/d/i, +BS, JP drain with scant dark green drainage in bulb JP w/ 135 cc/24h documented. Skin: warm and dry, no rashes  Psych: A&Ox3   Anti-infectives: Anti-infectives (From admission, onward)   Start     Dose/Rate Route Frequency Ordered Stop   04/28/17 1200  piperacillin-tazobactam (ZOSYN) IVPB 3.375 g     3.375  g 12.5 mL/hr over 240 Minutes Intravenous Every 8 hours 04/28/17 1103     04/27/17 0545  cefTRIAXone (ROCEPHIN) 2 g in sodium chloride 0.9 % 100 mL IVPB  Status:  Discontinued     2 g 200 mL/hr over 30 Minutes Intravenous Every 24 hours 04/27/17 0538 04/28/17 1103      Lab Results:  Recent Labs    04/30/17 0336 05/01/17 0325  WBC 14.8* 13.6*  HGB 9.2* 8.8*  HCT 26.8* 26.1*  PLT 279 312   BMET Recent Labs    04/30/17 0336 05/01/17 0325  NA 139 140  K 3.1* 4.1  CL 103 107  CO2 28 27  GLUCOSE 111* 131*  BUN 10 9  CREATININE 0.66 0.61  CALCIUM 8.0* 7.9*   PT/INR No results for input(s): LABPROT, INR in the last 72 hours. CMP     Component Value Date/Time   NA 140 05/01/2017 0325   K 4.1 05/01/2017 0325   CL 107 05/01/2017 0325   CO2 27 05/01/2017 0325   GLUCOSE 131 (H) 05/01/2017 0325   BUN 9 05/01/2017 0325   CREATININE 0.61 05/01/2017 0325   CALCIUM 7.9 (L) 05/01/2017 0325   PROT 5.3 (L) 05/01/2017 0325   ALBUMIN 1.8 (L) 05/01/2017 0325   AST 18 05/01/2017 0325   ALT 19 05/01/2017 0325   ALKPHOS 171 (H) 05/01/2017 0325   BILITOT 1.0 05/01/2017 0325   GFRNONAA >60 05/01/2017 0325   GFRAA >60 05/01/2017 0325   Lipase     Component Value Date/Time   LIPASE 36 05/01/2017 0325    Studies/Results:  No results found.    Kalman Drape , [facility] Surgery 05/01/2017, 8:19 AM  Pager: [phone] Mon-Wed, Friday 7:00am-4:30pm Thurs 7am-11:30am  Consults: [phone]

## 2017-05-01 NOTE — Progress Notes (Signed)
   Patient Name: Gerald Farrell Date of Encounter: 05/01/2017, 8:50 AM    Subjective  No problems after ERCP Drain output 15 cc last 12 hours   Objective  BP 135/70 (BP Location: Left Arm)   Pulse 70   Temp 97.7 F (36.5 C) (Oral)   Resp 18   Ht 5\' 9"  (1.753 m)   Wt 135 lb (61.2 kg)   SpO2 100%   BMI 19.94 kg/m  @BP  135/70 (BP Location: Left Arm)   Pulse 70   Temp 97.7 F (36.5 C) (Oral)   Resp 18   Ht 5\' 9"  (1.753 m)   Wt 135 lb (61.2 kg)   SpO2 100%   BMI 19.94 kg/m @  General:  NAD Eyes:   anicteric Lungs:  clear Heart::  S1S2 no rubs, murmurs or gallops Abdomen:  soft and nontender, BS+ RUQ drain with bile in it Ext:   no edema, cyanosis or clubbing    Data Reviewed:  Labs, I/O Lipase NL, LFT's ok Hgb stable - it dropped on 4/30 pre-ERCP - no signs of GI bleeding    Assessment and Plan  Bile leak - better after biliary stent and sphincterotomy No complications from ERCP  Advance diet He has f/u me 6/19 - would anticipate stent removal in late June or July as outpatient We will check again tomorrow   Gatha Mayer, MD, [facility] 05/01/2017 8:50 AM

## 2017-05-02 LAB — COMPREHENSIVE METABOLIC PANEL
ALT: 24 U/L (ref 17–63)
AST: 21 U/L (ref 15–41)
Albumin: 2.1 g/dL — ABNORMAL LOW (ref 3.5–5.0)
Alkaline Phosphatase: 173 U/L — ABNORMAL HIGH (ref 38–126)
Anion gap: 7 (ref 5–15)
BILIRUBIN TOTAL: 0.7 mg/dL (ref 0.3–1.2)
BUN: 7 mg/dL (ref 6–20)
CALCIUM: 8.3 mg/dL — AB (ref 8.9–10.3)
CHLORIDE: 104 mmol/L (ref 101–111)
CO2: 27 mmol/L (ref 22–32)
CREATININE: 0.67 mg/dL (ref 0.61–1.24)
Glucose, Bld: 82 mg/dL (ref 65–99)
Potassium: 3.8 mmol/L (ref 3.5–5.1)
Sodium: 138 mmol/L (ref 135–145)
TOTAL PROTEIN: 5.3 g/dL — AB (ref 6.5–8.1)

## 2017-05-02 LAB — CBC
HCT: 29 % — ABNORMAL LOW (ref 39.0–52.0)
Hemoglobin: 9.6 g/dL — ABNORMAL LOW (ref 13.0–17.0)
MCH: 31 pg (ref 26.0–34.0)
MCHC: 33.1 g/dL (ref 30.0–36.0)
MCV: 93.5 fL (ref 78.0–100.0)
Platelets: 382 10*3/uL (ref 150–400)
RBC: 3.1 MIL/uL — AB (ref 4.22–5.81)
RDW: 14.1 % (ref 11.5–15.5)
WBC: 13 10*3/uL — AB (ref 4.0–10.5)

## 2017-05-02 MED ORDER — OXYCODONE HCL 5 MG PO TABS: 5.0000 mg | ORAL_TABLET | Freq: Four times a day (QID) | ORAL | 0 refills | Status: DC | PRN

## 2017-05-02 NOTE — Care Management Important Message (Signed)
Important Message  Patient Details  Name: Gerald Farrell MRN: [id number] Date of Birth: 04/26/1945   Medicare Important Message Given:  Yes    Orbie Pyo 05/02/2017, 2:50 PM

## 2017-05-02 NOTE — Discharge Instructions (Signed)
[facility] ______[facility] °LAPAROSCOPIC SURGERY: POST OP INSTRUCTIONS °Always review your discharge instruction sheet given to you by the facility where your surgery was performed. °IF YOU HAVE DISABILITY OR FAMILY LEAVE FORMS, YOU MUST BRING THEM TO THE OFFICE FOR PROCESSING.   °DO NOT GIVE THEM TO YOUR DOCTOR. ° °1. A prescription for pain medication may be given to you upon discharge.  Take your pain medication as prescribed, if needed.  If narcotic pain medicine is not needed, then you may take acetaminophen (Tylenol) or ibuprofen (Advil) as needed. °2. Take your usually prescribed medications unless otherwise directed. °3. If you need a refill on your pain medication, please contact your pharmacy.  They will contact our office to request authorization. Prescriptions will not be filled after 5pm or on week-ends. °4. You should follow a light diet the first few days after arrival home, such as soup and crackers, etc.  Be sure to include lots of fluids daily. °5. Most patients will experience some swelling and bruising in the area of the incisions.  Ice packs will help.  Swelling and bruising can take several days to resolve.  °6. It is common to experience some constipation if taking pain medication after surgery.  Increasing fluid intake and taking a stool softener (such as Colace) will usually help or prevent this problem from occurring.  A mild laxative (Milk of Magnesia or Miralax) should be taken according to package instructions if there are no bowel movements after 48 hours. °7. Unless discharge instructions indicate otherwise, you may remove your bandages 24-48 hours after surgery, and you may shower at that time.  You may have steri-strips (small skin tapes) in place directly over the incision.  These strips should be left on the skin for 7-10 days.  If your surgeon used skin glue on the incision, you may shower in 24 hours.  The glue will flake off over the next 2-3 weeks.  Any sutures or  staples will be removed at the office during your follow-up visit. °8. ACTIVITIES:  You may resume regular (light) daily activities beginning the next day--such as daily self-care, walking, climbing stairs--gradually increasing activities as tolerated.  You may have sexual intercourse when it is comfortable.  Refrain from any heavy lifting or straining until approved by your doctor. °a. You may drive when you are no longer taking prescription pain medication, you can comfortably wear a seatbelt, and you can safely maneuver your car and apply brakes. °b. RETURN TO WORK:  __________________________________________________________ °9. You should see your doctor in the office for a follow-up appointment approximately 2-3 weeks after your surgery.  Make sure that you call for this appointment within a day or two after you arrive home to insure a convenient appointment time. °10. OTHER INSTRUCTIONS: __________________________________________________________________________________________________________________________ __________________________________________________________________________________________________________________________ °WHEN TO CALL YOUR DOCTOR: °1. Fever over 101.0 °2. Inability to urinate °3. Continued bleeding from incision. °4. Increased pain, redness, or drainage from the incision. °5. Increasing abdominal pain ° °The clinic staff is available to answer your questions during regular business hours.  Please don’t hesitate to call and ask to speak to one of the nurses for clinical concerns.  If you have a medical emergency, go to the nearest emergency room or call 911.  A surgeon from [facility] is always on call at the hospital. °[address], [address], [place], [place]  [address] ? [address] [address], [place],    [address] °[phone] ? [phone] ? FAX [phone] °Web site:   [email or web address]    Endoscopic Retrograde Cholangiopancreatogram, Care  After This sheet gives you information about how to care for yourself after your procedure. Your health care provider may also give you more specific instructions. If you have problems or questions, contact your health care provider. What can I expect after the procedure? After the procedure, it is common to have:  Soreness in your throat.  Nausea.  Bloating.  Dizziness.  Tiredness (fatigue).  Follow these instructions at home:  Take over-the-counter and prescription medicines only as told by your health care provider.  Do not drive for 24 hours if you were given a medicine to help you relax (sedative) during your procedure. Have someone stay with you for 24 hours after the procedure.  Return to your normal activities as told by your health care provider. Ask your health care provider what activities are safe for you.  Return to eating what you normally do as soon as you feel well enough or as told by your health care provider.  Keep all follow-up visits as told by your health care provider. This is important. Contact a health care provider if:  You have pain in your abdomen that does not get better with medicine.  You develop signs of infection, such as: ? Chills. ? Feeling unwell. Get help right away if:  You have difficulty swallowing.  You have worsening pain in your throat, chest, or abdomen.  You vomit bright red blood or a substance that looks like coffee grounds.  You have bloody or very black stools.  You have a fever.  You have a sudden increase in swelling (bloating) in your abdomen. Summary  After the procedure, it is common to feel tired and to have some discomfort in your throat.  Contact your health care provider if you have signs of infection--such as chills or feeling unwell--or if you have pain that does not improve with medicine.  Get help right away if you have trouble swallowing, worsening pain, bloody or black vomit, bloody or black stools, a  fever, or increased swelling in your abdomen.  Keep all follow-up visits as told by your health care provider. This is important. This information is not intended to replace advice given to you by your health care provider. Make sure you discuss any questions you have with your health care provider. Document Released: 10/08/2012 Document Revised: 11/07/2015 Document Reviewed: 11/07/2015 Elsevier Interactive Patient Education  2017 [employer] Surgical drains are used to remove extra fluid that normally builds up in a surgical wound after surgery. A surgical drain helps to heal a surgical wound. Different kinds of surgical drains include:  Active drains. These drains use suction to pull drainage away from the surgical wound. Drainage flows through a tube to a container outside of the body. It is important to keep the bulb or the drainage container flat (compressed) at all times, except while you empty it. Flattening the bulb or container creates suction. The two most common types of active drains are bulb drains and Hemovac drains.  Passive drains. These drains allow fluid to drain naturally, by gravity. Drainage flows through a tube to a bandage (dressing) or a container outside of the body. Passive drains do not need to be emptied. The most common type of passive drain is the Penrose drain.  A drain is placed during surgery. Immediately after surgery, drainage is usually bright red and a little thicker than water. The drainage may gradually turn  yellow or pink and become thinner. It is likely that your health care provider will remove the drain when the drainage stops or when the amount decreases to 1-2 Tbsp (15-30 mL) during a 24-hour period. How to care for your surgical drain  Keep the skin around the drain dry and covered with a dressing at all times.  Check your drain area every day for signs of infection. Check for: ? More redness, swelling, or pain. ? Pus  or a bad smell. ? Cloudy drainage. Follow instructions from your health care provider about how to take care of your drain and how to change your dressing. Change your dressing at least one time every day. Change it more often if needed to keep the dressing dry. Make sure you: 1. Gather your supplies, including: ? Tape. ? Germ-free cleaning solution (sterile saline). ? Split gauze drain sponge: 4 x 4 inches (10 x 10 cm). ? Gauze square: 4 x 4 inches (10 x 10 cm). 2. Wash your hands with soap and water before you change your dressing. If soap and water are not available, use hand sanitizer. 3. Remove the old dressing. Avoid using scissors to do that. 4. Use sterile saline to clean your skin around the drain. 5. Place the tube through the slit in a drain sponge. Place the drain sponge so that it covers your wound. 6. Place the gauze square or another drain sponge on top of the drain sponge that is on the wound. Make sure the tube is between those layers. 7. Tape the dressing to your skin. 8. If you have an active bulb or Hemovac drain, tape the drainage tube to your skin 1-2 inches (2.5-5 cm) below the place where the tube enters your body. Taping keeps the tube from pulling on any stitches (sutures) that you have. 9. Wash your hands with soap and water. 10. Write down the color of your drainage and how often you change your dressing.  How to empty your active bulb or Hemovac drain 1. Make sure that you have a measuring cup that you can empty your drainage into. 2. Wash your hands with soap and water. If soap and water are not available, use hand sanitizer. 3. Gently move your fingers down the tube while squeezing very lightly. This is called stripping the tube. This clears any drainage, clots, or tissue from the tube. ? Do not pull on the tube. ? You may need to strip the tube several times every day to keep the tube clear. 4. Open the bulb cap or the drain plug. Do not touch the inside of the  cap or the bottom of the plug. 5. Empty all of the drainage into the measuring cup. 6. Compress the bulb or the container and replace the cap or the plug. To compress the bulb or the container, squeeze it firmly in the middle while you close the cap or plug the container. 7. Write down the amount of drainage that you have in each 24-hour period. If you have less than 2 Tbsp (30 mL) of drainage during 24 hours, contact your health care provider. 8. Flush the drainage down the toilet. 9. Wash your hands with soap and water. Contact a health care provider if:  You have more redness, swelling, or pain around your drain area.  The amount of drainage that you have is increasing instead of decreasing.  You have pus or a bad smell coming from your drain area.  You have a fever.  You have drainage that is cloudy.  There is a sudden stop or a sudden decrease in the amount of drainage that you have.  Your tube falls out.  Your active draindoes not stay compressedafter you empty it. This information is not intended to replace advice given to you by your health care provider. Make sure you discuss any questions you have with your health care provider. Document Released: 12/16/1999 Document Revised: 05/26/2015 Document Reviewed: 07/07/2014 Elsevier Interactive Patient Education  2018 [employer].

## 2017-05-02 NOTE — Discharge Summary (Signed)
[facility]/Trauma Discharge Summary   Patient ID: Gerald Farrell MRN: [id number] DOB/AGE: [birth year] 72 y.o.  Admit date: 04/26/2017 Discharge date: 05/02/2017  Admitting Diagnosis: Abdominal pain cholecystitis  Discharge Diagnosis Patient Active Problem List   Diagnosis Date Noted  . Bile duct leak   . Acute cholecystitis 04/27/2017    Consultants GI  Imaging: No results found.  Procedures Dr. Rosendo Gros (04/28/17) - Laparoscopic Cholecystectomy with drain placement Dr. Carlean Purl (04/30/17) - ERCP with stent placement  HPI: Patient is a 72 year old male with a history of hypertension and chronic back pain on chronic pain medication, who comes in with a 5-day history of abdominal pain.  Patient states that the abdominal pain began in the right lower quadrant and worked his way up to the right upper quadrant.  He states that it is currently in the right upper quadrant and does radiate up the chest and around the back.  Patient states that over the last 5 days he has had decreased appetite.  He denies any nausea or vomiting.  Patient was concerned with sepsis from dental issues.  Upon evaluation in the ER he underwent ultrasound, CT scan, laboratory studies.  CT scan and ultrasound revealed gallbladder with gallstones, gallbladder wall thickening consistent with cholecystitis.  I did review the scans myself.  Patient laboratory studies reveal an elevated WBC count and elevated T bili at 1.3.  AST/ALT, lipase were all within normal limits.  Hospital Course:  Patient was admitted and underwent procedure listed above.  Tolerated procedure well and was transferred to the floor.  Diet was advanced as tolerated. Pt was having copious bilious drainage from surgical drain so GI was consulted. GI did ERCP with stent placement and drain output decreased greatly. On POD#4, the patient was voiding well, tolerating diet, ambulating well, pain well controlled, vital signs stable,  incisions c/d/i and felt stable for discharge home.  Patient will follow up in our office in 1 week and knows to call with questions or concerns. Drain will remain in until his postop appointment.    Patient was discharged in good condition.  The [place] Substance controlled database was reviewed prior to prescribing narcotic pain medication to this patient.  Physical Exam: Gen: Alert, NAD, pleasant Card: Regular rate and rhythm Pulm:CTA b/l, rate and effort normal Abd: Soft,mild TTP of RUQ without guarding, incisions c/d/i, +BS, JP drain with scant dark green drainage in bulb JP w/30cc/24h documented. Extremities: no swelling, redness or TTP of b/l calves Skin: warm and dry, no rashes  Psych: A&Ox3    Allergies as of 05/02/2017   No Known Allergies     Medication List    TAKE these medications   amLODipine 10 MG tablet Commonly known as:  NORVASC Take 5 mg by mouth daily.   carvedilol 25 MG tablet Commonly known as:  COREG Take 25 mg by mouth 2 (two) times daily with a meal.   cloNIDine 0.1 MG tablet Commonly known as:  CATAPRES Take 0.1 mg by mouth 3 (three) times daily.   diclofenac sodium 1 % Gel Commonly known as:  VOLTAREN Apply 2 g topically daily as needed (pain).   gabapentin 400 MG capsule Commonly known as:  NEURONTIN Take 800 mg by mouth 3 (three) times daily.   hydrochlorothiazide 25 MG tablet Commonly known as:  HYDRODIURIL Take 25 mg by mouth daily.   ibuprofen 200 MG tablet Commonly known as:  ADVIL,MOTRIN Take 800 mg by mouth every 6 (six) hours as needed for  moderate pain.   ketoconazole 2 % cream Commonly known as:  NIZORAL Apply 1 application topically daily as needed for irritation.   lisinopril 40 MG tablet Commonly known as:  PRINIVIL,ZESTRIL Take 40 mg by mouth daily.   meloxicam 15 MG tablet Commonly known as:  MOBIC Take 15 mg by mouth daily.   methocarbamol 750 MG tablet Commonly known as:  ROBAXIN Take 750 mg by  mouth 3 (three) times daily.   omeprazole 20 MG capsule Commonly known as:  PRILOSEC Take 20 mg by mouth daily.   oxycodone 5 MG capsule Commonly known as:  OXY-IR Take 5 mg by mouth 3 (three) times daily as needed for pain. What changed:  Another medication with the same name was added. Make sure you understand how and when to take each.   oxyCODONE 5 MG immediate release tablet Commonly known as:  Oxy IR/ROXICODONE Take 1 tablet (5 mg total) by mouth every 6 (six) hours as needed for moderate pain. What changed:  You were already taking a medication with the same name, and this prescription was added. Make sure you understand how and when to take each.   tamsulosin 0.4 MG Caps capsule Commonly known as:  FLOMAX Take 0.4 mg by mouth daily.        Follow-up Information    Ralene Ok, MD. Go on 05/09/2017.   Specialty:  General Surgery Why:  at 8:50am for your follow up appointment. Please arrive 30 minutes prior to complete paperwork Contact information: [address] [place] [address] [phone]        Gatha Mayer, MD. Go on 06/19/2017.   Specialty:  Gastroenterology Why:  call for time Contact information: [address] [place] [address] [phone]           Signed: [facility] 05/02/2017, 8:43 AM Pager: [phone] Consults: [phone] Mon-Fri 7:00 am-4:30 pm Sat-Sun 7:00 am-11:30 am

## 2017-05-02 NOTE — Progress Notes (Addendum)
     [facility] Progress Note   Chief Complaint:   Bile duct leak   SUBJECTIVE:   Some discomfort at drain site but no significant pain. Still getting clear liquids.   ASSESSMENT AND PLAN:   1. 72 yo male with post cholecystectomy bile duct leak, s/p ERCP with biliary stent placement on 4/30. Liver chemistries normal except alk phos of 173 (not unexpected). Afebrile, no significant abdominal pain.  -will need removal of stent late June / July, our office will arrange this. Hopefully home soon  2. Malnutrition?  Thin with low albumin low.      [facility] Attending   I have taken an interval history, reviewed the chart and examined the patient. I agree with the Advanced Practitioner's note, impression and recommendations.   Home today  Has f/u me in June Diet Reg No GI meds  Gatha Mayer, MD, [facility] 05/02/2017 11:11 AM   OBJECTIVE:     Vital signs in last 24 hours: Temp:  [98 F (36.7 C)-98.1 F (36.7 C)] 98 F (36.7 C) (05/02 0428) Pulse Rate:  [67-76] 70 (05/02 0428) Resp:  [18] 18 (05/02 0428) BP: (149-157)/(73-78) 157/78 (05/02 0428) SpO2:  [99 %-100 %] 100 % (05/02 0428) Last BM Date: 05/01/17 General:   Alert, thin white male in NAD EENT:  Normal hearing, non icteric sclera, conjunctive pink.  Heart:  Regular rate and rhythm; no murmurs. No lower extremity edema Pulm: Normal respiratory effort, lungs CTA bilaterally without wheezes or crackles. Abdomen:  Soft, nondistended, nontender.  Normal bowel sounds, no masses felt. JP drain with ~ 75 ml thin yellowish fluid Neurologic:  Alert and  oriented x4;  grossly normal neurologically. Psych:  Pleasant, cooperative.  Normal mood and affect.   Intake/Output from previous day: 05/01 0701 - 05/02 0700 In: -  Out: 2205 [Urine:2175; Drains:30]  Lab Results: Recent Labs    04/30/17 0336 05/01/17 0325 05/02/17 0452  WBC 14.8* 13.6* 13.0*  HGB 9.2* 8.8* 9.6*  HCT  26.8* 26.1* 29.0*  PLT 279 312 382   BMET  LFT Recent Labs    05/02/17 0452  PROT 5.3*  ALBUMIN 2.1*  AST 21  ALT 24  ALKPHOS 173*  BILITOT 0.7      LOS: 4 days   Tye Savoy ,NP 05/02/2017, 9:59 AM

## 2017-05-02 NOTE — Progress Notes (Signed)
Discharged home today.Prescription ,discharged instructions,personal belongings given to patient.Verbalized understanding of instructions 

## 2017-06-19 ENCOUNTER — Ambulatory Visit (INDEPENDENT_AMBULATORY_CARE_PROVIDER_SITE_OTHER): Payer: Self-pay | Admitting: Internal Medicine

## 2017-06-19 ENCOUNTER — Encounter (INDEPENDENT_AMBULATORY_CARE_PROVIDER_SITE_OTHER): Payer: Self-pay

## 2017-06-19 ENCOUNTER — Other Ambulatory Visit (INDEPENDENT_AMBULATORY_CARE_PROVIDER_SITE_OTHER): Payer: Self-pay

## 2017-06-19 ENCOUNTER — Encounter: Payer: Self-pay | Admitting: Internal Medicine

## 2017-06-19 VITALS — BP 90/64 | HR 80 | Ht 69.0 in | Wt 131.0 lb

## 2017-06-19 DIAGNOSIS — K838 Other specified diseases of biliary tract: Secondary | ICD-10-CM

## 2017-06-19 DIAGNOSIS — R932 Abnormal findings on diagnostic imaging of liver and biliary tract: Secondary | ICD-10-CM

## 2017-06-19 DIAGNOSIS — E876 Hypokalemia: Secondary | ICD-10-CM

## 2017-06-19 DIAGNOSIS — E860 Dehydration: Secondary | ICD-10-CM

## 2017-06-19 DIAGNOSIS — F439 Reaction to severe stress, unspecified: Secondary | ICD-10-CM

## 2017-06-19 LAB — CBC WITH DIFFERENTIAL/PLATELET
BASOS ABS: 0.1 10*3/uL (ref 0.0–0.1)
Basophils Relative: 0.8 % (ref 0.0–3.0)
EOS PCT: 1.8 % (ref 0.0–5.0)
Eosinophils Absolute: 0.2 10*3/uL (ref 0.0–0.7)
HEMATOCRIT: 38 % — AB (ref 39.0–52.0)
HEMOGLOBIN: 12.7 g/dL — AB (ref 13.0–17.0)
LYMPHS ABS: 2.2 10*3/uL (ref 0.7–4.0)
LYMPHS PCT: 24.2 % (ref 12.0–46.0)
MCHC: 33.5 g/dL (ref 30.0–36.0)
MCV: 92.2 fl (ref 78.0–100.0)
MONOS PCT: 6 % (ref 3.0–12.0)
Monocytes Absolute: 0.5 10*3/uL (ref 0.1–1.0)
Neutro Abs: 6 10*3/uL (ref 1.4–7.7)
Neutrophils Relative %: 67.2 % (ref 43.0–77.0)
Platelets: 330 10*3/uL (ref 150.0–400.0)
RBC: 4.12 Mil/uL — ABNORMAL LOW (ref 4.22–5.81)
RDW: 14.7 % (ref 11.5–15.5)
WBC: 9 10*3/uL (ref 4.0–10.5)

## 2017-06-19 LAB — COMPREHENSIVE METABOLIC PANEL
ALBUMIN: 3.4 g/dL — AB (ref 3.5–5.2)
ALK PHOS: 84 U/L (ref 39–117)
ALT: 8 U/L (ref 0–53)
AST: 12 U/L (ref 0–37)
BILIRUBIN TOTAL: 0.4 mg/dL (ref 0.2–1.2)
BUN: 16 mg/dL (ref 6–23)
CO2: 30 mEq/L (ref 19–32)
Calcium: 8.6 mg/dL (ref 8.4–10.5)
Chloride: 101 mEq/L (ref 96–112)
Creatinine, Ser: 1.04 mg/dL (ref 0.40–1.50)
GFR: 75.11 mL/min (ref >60.00)
Glucose, Bld: 87 mg/dL (ref 70–99)
POTASSIUM: 3.2 meq/L — AB (ref 3.5–5.1)
Sodium: 139 mEq/L (ref 135–145)
Total Protein: 6.4 g/dL (ref 6.0–8.3)

## 2017-06-19 NOTE — Progress Notes (Signed)
Gerald Farrell 72 y.o. 11-15-1945 [id number]  Assessment & Plan:   Encounter Diagnoses  Name Primary?  . Bile duct leak Yes  . Abnormal cholangiogram   . Dehydration - recent   . Hypokalemia   . Situational stress-son with schizoaffective disorder     I am going to go ahead and check a CMET today.  He may need some potassium supplementation.  I have encouraged him to eat and drink as well as he can.  Will schedule for ERCP with stent removal and cholangiogram to ensure no stones.  Outpatient at the hospital.  Next week hopefully. The risks and benefits as well as alternatives of endoscopic procedure(s) have been discussed and reviewed. All questions answered. The patient agrees to proceed.     Subjective:   Chief Complaint: Follow-up of bile leak with biliary stent  HPI Patient is here for follow-up after he had an ERCP with stent placement April 30 after surgery for a gangrenous gallbladder, with bile leak.  The leak resolved and his drain was pulled though he still has some intermittent mild right upper quadrant pains.  At the time there were some filling defects probably bubbles but could not exclude stones.  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  He has had a rough time because his son has schizoaffective disorder and he has had to get him involuntarily committed and the patient is lost a lot of sleep and has not been eating well.  He reports that he had low potassium and was dehydrated and was treated with some IV fluids at the [place], prior to that procedure.  In fact he pulled over on the highway on the way there and was taken by ambulance to the [place].  He feels better though he is still not back to normal and is not eating as much as he was.  He needs lymph node follow-up on the melanoma and is supposed to have Mohs surgery on his head or face in 5 days. No Known  Allergies Current Meds  Medication Sig  . amLODipine (NORVASC) 10 MG tablet Take 5 mg by mouth daily.  . carvedilol (COREG) 25 MG tablet Take 25 mg by mouth 2 (two) times daily with a meal.  . cloNIDine (CATAPRES) 0.1 MG tablet Take 0.1 mg by mouth 3 (three) times daily.  Marland Kitchen gabapentin (NEURONTIN) 400 MG capsule Take 800 mg by mouth 3 (three) times daily.  . hydrochlorothiazide (HYDRODIURIL) 25 MG tablet Take 25 mg by mouth daily.  Marland Kitchen ibuprofen (ADVIL,MOTRIN) 200 MG tablet Take 800 mg by mouth every 6 (six) hours as needed for moderate pain.  Marland Kitchen ketoconazole (NIZORAL) 2 % cream Apply 1 application topically daily as needed for irritation.  Marland Kitchen lisinopril (PRINIVIL,ZESTRIL) 40 MG tablet Take 40 mg by mouth daily.  . meloxicam (MOBIC) 15 MG tablet Take 15 mg by mouth daily.  . methocarbamol (ROBAXIN) 750 MG tablet Take 750 mg by mouth 3 (three) times daily.  Marland Kitchen omeprazole (PRILOSEC) 20 MG capsule Take 20 mg by mouth daily.  . tamsulosin (FLOMAX) 0.4 MG CAPS capsule Take 0.4 mg by mouth daily.   Past Medical History:  Diagnosis Date  . Acute cholecystitis 04/27/2017  . Alcohol abuse   . Basal cell carcinoma   . Hypertension    Past Surgical History:  Procedure Laterality Date  . BASAL CELL CARCINOMA EXCISION    .  CHOLECYSTECTOMY N/A 04/28/2017   Procedure: LAPAROSCOPIC CHOLECYSTECTOMY;  Surgeon: Ralene Ok, MD;  Location: [facility];  Service: General;  Laterality: N/A;  . ERCP N/A 04/30/2017   Procedure: ENDOSCOPIC RETROGRADE CHOLANGIOPANCREATOGRAPHY (ERCP);  Surgeon: Gatha Mayer, MD;  Location: [facility] END[facility]COPY;  Service: Endoscopy;  Laterality: N/A;  . LUMBAR LAMINECTOMY    . REPLACEMENT TOTAL KNEE BILATERAL     Social History   Social History Narrative   Divorced   Veteran - goes to [employer]   No alcohol now   Smoker   Not using drugs now   family history includes Heart disease in his father and mother.   Review of Systems See HPI  Objective:   Physical Exam BP 90/64   Pulse  80   Ht 5\' 9"  (1.753 m)   Wt 131 lb (59.4 kg)   BMI 19.35 kg/m  Anicteric Mildly chronically ill Lungs cta but diffusely decreased bs Ht distant s1s2 no rmg abd soft NT no masses Alert and oriented x 3

## 2017-06-19 NOTE — Patient Instructions (Addendum)
  Your provider has requested that you go to the basement level for lab work before leaving today. Press "B" on the elevator. The lab is located at the first door on the left as you exit the elevator.   You have been scheduled for an ERCP. Please follow written instructions given to you at your visit today. If you use inhalers (even only as needed), please bring them with you on the day of your procedure.   I appreciate the opportunity to care for you. Silvano Rusk, MD, [facility] patient back after his visit and told him NPO after midnight for his ERCP to mark the clear liquid part off his instructions.

## 2017-06-19 NOTE — Progress Notes (Signed)
Low K Mild anemia Otherwise labs good  Rx K Dur 20 meQ po qd # 90 1 RF

## 2017-06-19 NOTE — H&P (View-Only) (Signed)
Gerald Farrell 72 y.o. 05/08/1945 [id number]  Assessment & Plan:   Encounter Diagnoses  Name Primary?  . Bile duct leak Yes  . Abnormal cholangiogram   . Dehydration - recent   . Hypokalemia   . Situational stress-son with schizoaffective disorder     I am going to go ahead and check a CMET today.  He may need some potassium supplementation.  I have encouraged him to eat and drink as well as he can.  Will schedule for ERCP with stent removal and cholangiogram to ensure no stones.  Outpatient at the hospital.  Next week hopefully. The risks and benefits as well as alternatives of endoscopic procedure(s) have been discussed and reviewed. All questions answered. The patient agrees to proceed.     Subjective:   Chief Complaint: Follow-up of bile leak with biliary stent  HPI Patient is here for follow-up after he had an ERCP with stent placement April 30 after surgery for a gangrenous gallbladder, with bile leak.  The leak resolved and his drain was pulled though he still has some intermittent mild right upper quadrant pains.  At the time there were some filling defects probably bubbles but could not exclude stones.  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  He has had a rough time because his son has schizoaffective disorder and he has had to get him involuntarily committed and the patient is lost a lot of sleep and has not been eating well.  He reports that he had low potassium and was dehydrated and was treated with some IV fluids at the [place], prior to that procedure.  In fact he pulled over on the highway on the way there and was taken by ambulance to the [place].  He feels better though he is still not back to normal and is not eating as much as he was.  He needs lymph node follow-up on the melanoma and is supposed to have Mohs surgery on his head or face in 5 days. No Known  Allergies Current Meds  Medication Sig  . amLODipine (NORVASC) 10 MG tablet Take 5 mg by mouth daily.  . carvedilol (COREG) 25 MG tablet Take 25 mg by mouth 2 (two) times daily with a meal.  . cloNIDine (CATAPRES) 0.1 MG tablet Take 0.1 mg by mouth 3 (three) times daily.  Marland Kitchen gabapentin (NEURONTIN) 400 MG capsule Take 800 mg by mouth 3 (three) times daily.  . hydrochlorothiazide (HYDRODIURIL) 25 MG tablet Take 25 mg by mouth daily.  Marland Kitchen ibuprofen (ADVIL,MOTRIN) 200 MG tablet Take 800 mg by mouth every 6 (six) hours as needed for moderate pain.  Marland Kitchen ketoconazole (NIZORAL) 2 % cream Apply 1 application topically daily as needed for irritation.  Marland Kitchen lisinopril (PRINIVIL,ZESTRIL) 40 MG tablet Take 40 mg by mouth daily.  . meloxicam (MOBIC) 15 MG tablet Take 15 mg by mouth daily.  . methocarbamol (ROBAXIN) 750 MG tablet Take 750 mg by mouth 3 (three) times daily.  Marland Kitchen omeprazole (PRILOSEC) 20 MG capsule Take 20 mg by mouth daily.  . tamsulosin (FLOMAX) 0.4 MG CAPS capsule Take 0.4 mg by mouth daily.   Past Medical History:  Diagnosis Date  . Acute cholecystitis 04/27/2017  . Alcohol abuse   . Basal cell carcinoma   . Hypertension    Past Surgical History:  Procedure Laterality Date  . BASAL CELL CARCINOMA EXCISION    .  CHOLECYSTECTOMY N/A 04/28/2017   Procedure: LAPAROSCOPIC CHOLECYSTECTOMY;  Surgeon: Ralene Ok, MD;  Location: [place];  Service: General;  Laterality: N/A;  . ERCP N/A 04/30/2017   Procedure: ENDOSCOPIC RETROGRADE CHOLANGIOPANCREATOGRAPHY (ERCP);  Surgeon: Gatha Mayer, MD;  Location: [facility] ENDOSCOPY;  Service: Endoscopy;  Laterality: N/A;  . LUMBAR LAMINECTOMY    . REPLACEMENT TOTAL KNEE BILATERAL     Social History   Social History Narrative   Divorced   Veteran - goes to [employer]   No alcohol now   Smoker   Not using drugs now   family history includes Heart disease in his father and mother.   Review of Systems See HPI  Objective:   Physical Exam BP 90/64   Pulse  80   Ht 5\' 9"  (1.753 m)   Wt 131 lb (59.4 kg)   BMI 19.35 kg/m  Anicteric Mildly chronically ill Lungs cta but diffusely decreased bs Ht distant s1s2 no rmg abd soft NT no masses Alert and oriented x 3

## 2017-06-19 NOTE — Assessment & Plan Note (Signed)
ERCP - remove stent, check for stones

## 2017-06-21 ENCOUNTER — Other Ambulatory Visit: Payer: Self-pay

## 2017-06-21 MED ORDER — POTASSIUM CHLORIDE ER 20 MEQ PO TBCR: 20.0000 meq | EXTENDED_RELEASE_TABLET | Freq: Every day | ORAL | 1 refills | Status: DC

## 2017-06-24 ENCOUNTER — Telehealth: Payer: Self-pay | Admitting: Internal Medicine

## 2017-06-24 ENCOUNTER — Encounter (HOSPITAL_COMMUNITY): Payer: Self-pay | Admitting: *Deleted

## 2017-06-24 NOTE — Telephone Encounter (Signed)
Pt called [facility] inform [facility] Dr[facility] Carlean Purl regarding the procedure that pt is having on Wednesday at [facility]. [place] fax is [phone] Attn. [place]. Pls call pt for more information.

## 2017-06-24 NOTE — Telephone Encounter (Signed)
Notes faxed.

## 2017-06-26 ENCOUNTER — Ambulatory Visit (HOSPITAL_COMMUNITY)
Admission: RE | Admit: 2017-06-26 | Discharge: 2017-06-26 | Disposition: A | Discharge status: Home / Self Care | Payer: Non-veteran care | Source: Ambulatory Visit | Attending: Internal Medicine | Admitting: Internal Medicine

## 2017-06-26 ENCOUNTER — Encounter (HOSPITAL_COMMUNITY): Payer: Self-pay | Admitting: *Deleted

## 2017-06-26 ENCOUNTER — Ambulatory Visit (HOSPITAL_COMMUNITY)
Admission: RE | Disposition: A | Discharge status: Home / Self Care | Payer: Self-pay | Source: Ambulatory Visit | Attending: Internal Medicine

## 2017-06-26 ENCOUNTER — Ambulatory Visit (HOSPITAL_COMMUNITY): Payer: Non-veteran care | Admitting: Certified Registered Nurse Anesthetist

## 2017-06-26 ENCOUNTER — Ambulatory Visit (HOSPITAL_COMMUNITY): Payer: Non-veteran care

## 2017-06-26 ENCOUNTER — Other Ambulatory Visit: Payer: Self-pay

## 2017-06-26 DIAGNOSIS — R932 Abnormal findings on diagnostic imaging of liver and biliary tract: Secondary | ICD-10-CM | POA: Diagnosis not present

## 2017-06-26 DIAGNOSIS — Z79899 Other long term (current) drug therapy: Secondary | ICD-10-CM | POA: Diagnosis not present

## 2017-06-26 DIAGNOSIS — F17200 Nicotine dependence, unspecified, uncomplicated: Secondary | ICD-10-CM | POA: Insufficient documentation

## 2017-06-26 DIAGNOSIS — R1011 Right upper quadrant pain: Secondary | ICD-10-CM | POA: Diagnosis not present

## 2017-06-26 DIAGNOSIS — K839 Disease of biliary tract, unspecified: Secondary | ICD-10-CM

## 2017-06-26 DIAGNOSIS — E860 Dehydration: Secondary | ICD-10-CM | POA: Insufficient documentation

## 2017-06-26 DIAGNOSIS — Z4659 Encounter for fitting and adjustment of other gastrointestinal appliance and device: Secondary | ICD-10-CM | POA: Insufficient documentation

## 2017-06-26 DIAGNOSIS — I10 Essential (primary) hypertension: Secondary | ICD-10-CM | POA: Insufficient documentation

## 2017-06-26 DIAGNOSIS — Z85828 Personal history of other malignant neoplasm of skin: Secondary | ICD-10-CM | POA: Insufficient documentation

## 2017-06-26 DIAGNOSIS — K838 Other specified diseases of biliary tract: Secondary | ICD-10-CM | POA: Diagnosis not present

## 2017-06-26 HISTORY — PX: ENDOSCOPIC RETROGRADE CHOLANGIOPANCREATOGRAPHY (ERCP) WITH PROPOFOL: SHX5810

## 2017-06-26 HISTORY — PX: STENT REMOVAL: SHX6421

## 2017-06-26 SURGERY — ENDOSCOPIC RETROGRADE CHOLANGIOPANCREATOGRAPHY (ERCP) WITH PROPOFOL
Anesthesia: General

## 2017-06-26 MED ORDER — INDOMETHACIN 50 MG RE SUPP: 100.0000 mg | Freq: Once | RECTAL | Status: DC

## 2017-06-26 MED ORDER — SODIUM CHLORIDE 0.9 % IV SOLN
1.5000 g | Freq: Once | INTRAVENOUS | Status: AC
  Administered 2017-06-26: 1.5 g via INTRAVENOUS
  Filled 2017-06-26: qty 1.5

## 2017-06-26 MED ORDER — SODIUM CHLORIDE 0.9 % IV SOLN: INTRAVENOUS | Status: DC

## 2017-06-26 MED ORDER — LIDOCAINE 2% (20 MG/ML) 5 ML SYRINGE
INTRAMUSCULAR | Status: DC | PRN
  Administered 2017-06-26: 60 mg via INTRAVENOUS

## 2017-06-26 MED ORDER — SODIUM CHLORIDE 0.9 % IV SOLN
INTRAVENOUS | Status: DC | PRN
  Administered 2017-06-26: 30 mL

## 2017-06-26 MED ORDER — ONDANSETRON HCL 4 MG/2ML IJ SOLN
INTRAMUSCULAR | Status: DC | PRN
  Administered 2017-06-26: 4 mg via INTRAVENOUS

## 2017-06-26 MED ORDER — FENTANYL CITRATE (PF) 100 MCG/2ML IJ SOLN
INTRAMUSCULAR | Status: DC | PRN
  Administered 2017-06-26 (×2): 50 ug via INTRAVENOUS

## 2017-06-26 MED ORDER — SUCCINYLCHOLINE CHLORIDE 200 MG/10ML IV SOSY
PREFILLED_SYRINGE | INTRAVENOUS | Status: DC | PRN
  Administered 2017-06-26: 100 mg via INTRAVENOUS

## 2017-06-26 MED ORDER — GLUCAGON HCL RDNA (DIAGNOSTIC) 1 MG IJ SOLR
INTRAMUSCULAR | Status: AC
  Filled 2017-06-26: qty 1

## 2017-06-26 MED ORDER — INDOMETHACIN 50 MG RE SUPP
RECTAL | Status: AC
  Filled 2017-06-26: qty 2

## 2017-06-26 MED ORDER — EPHEDRINE SULFATE 50 MG/ML IJ SOLN
INTRAMUSCULAR | Status: DC | PRN
  Administered 2017-06-26: 10 mg via INTRAVENOUS

## 2017-06-26 MED ORDER — LACTATED RINGERS IV SOLN
INTRAVENOUS | Status: DC
  Administered 2017-06-26: 11:00:00 via INTRAVENOUS

## 2017-06-26 MED ORDER — FENTANYL CITRATE (PF) 100 MCG/2ML IJ SOLN
INTRAMUSCULAR | Status: AC
  Filled 2017-06-26: qty 2

## 2017-06-26 MED ORDER — INDOMETHACIN 50 MG RE SUPP
RECTAL | Status: DC | PRN
  Administered 2017-06-26: 50 mg via RECTAL

## 2017-06-26 MED ORDER — PROPOFOL 10 MG/ML IV BOLUS
INTRAVENOUS | Status: DC | PRN
  Administered 2017-06-26: 150 mg via INTRAVENOUS

## 2017-06-26 MED ORDER — PROPOFOL 10 MG/ML IV BOLUS
INTRAVENOUS | Status: AC
  Filled 2017-06-26: qty 20

## 2017-06-26 NOTE — Interval H&P Note (Signed)
History and Physical Interval Note:  06/26/2017 11:37 AM  Gerald Farrell  has presented today for surgery, with the diagnosis of bile duct leak, abnormal cholangiogram  The various methods of treatment have been discussed with the patient and family. After consideration of risks, benefits and other options for treatment, the patient has consented to  Procedure(s): ENDOSCOPIC RETROGRADE CHOLANGIOPANCREATOGRAPHY (ERCP) WITH PROPOFOL (N/A) as a surgical intervention .  The patient's history has been reviewed, patient examined, no change in status, stable for surgery.  I have reviewed the patient's chart and labs.  Questions were answered to the patient's satisfaction.     Silvano Rusk

## 2017-06-26 NOTE — Discharge Instructions (Signed)
° °  I removed the stent and did not see any stones.  I believe the leak is closed though there was some dye seen outside of bile ducts I believe it was dye that spilled into the intestine and not a leak.  If you start to have pain or problems in the abdomen let me know and we will need to reassess.  You have a sphincterotomy or cut in the muscle where the bile enters the intestine so that should treat any residual leak and allow it to heal if that is the case.  I appreciate the opportunity to care for you. Gatha Mayer, MD, FACG  YOU HAD AN ENDOSCOPIC PROCEDURE TODAY: Refer to the procedure report and other information in the discharge instructions given to you for any specific questions about what was found during the examination. If this information does not answer your questions, please call Dr. Celesta Aver office at [phone] to clarify.   YOU SHOULD EXPECT: Some feelings of bloating in the abdomen. Passage of more gas than usual. Walking can help get rid of the air that was put into your GI tract during the procedure and reduce the bloating. If you had a lower endoscopy (such as a colonoscopy or flexible sigmoidoscopy) you may notice spotting of blood in your stool or on the toilet paper. Some abdominal soreness may be present for a day or two, also.  DIET: Stay on liquids only today and try regular foods tomorrow.   ACTIVITY: Your care partner should take you home directly after the procedure. You should plan to take it easy, moving slowly for the rest of the day. You can resume normal activity the day after the procedure however YOU SHOULD NOT DRIVE, use power tools, machinery or perform tasks that involve climbing or major physical exertion for 24 hours (because of the sedation medicines used during the test).   SYMPTOMS TO REPORT IMMEDIATELY: A gastroenterologist can be reached at any hour. Please call [phone]  for any of the following symptoms:   Following upper  endoscopy (EGD, EUS, ERCP, esophageal dilation) Vomiting of blood or coffee ground material  New, significant abdominal pain  New, significant chest pain or pain under the shoulder blades  Painful or persistently difficult swallowing  New shortness of breath  Black, tarry-looking or red, bloody stools  FOLLOW UP:  If any biopsies were taken you will be contacted by phone or by letter within the next 1-3 weeks. Call [phone]  if you have not heard about the biopsies in 3 weeks.  Please also call with any specific questions about appointments or follow up tests.

## 2017-06-26 NOTE — Anesthesia Preprocedure Evaluation (Addendum)
Anesthesia Evaluation  Patient identified by MRN, date of birth, ID band Patient awake    Reviewed: Allergy & Precautions, H&P , NPO status , Patient's Chart, lab work & pertinent test results, reviewed documented beta blocker date and time   Airway Mallampati: II  TM Distance: >3 FB Neck ROM: Full    Dental no notable dental hx. (+) Poor Dentition, Dental Advisory Given, Missing   Pulmonary Current Smoker,    Pulmonary exam normal breath sounds clear to auscultation       Cardiovascular hypertension, Pt. on medications and Pt. on home beta blockers  Rhythm:Regular Rate:Normal     Neuro/Psych    GI/Hepatic   Endo/Other    Renal/GU      Musculoskeletal   Abdominal   Peds  Hematology   Anesthesia Other Findings   Reproductive/Obstetrics                            Anesthesia Physical  Anesthesia Plan  ASA: III  Anesthesia Plan: General   Post-op Pain Management:    Induction: Intravenous  PONV Risk Score and Plan: 2 and Ondansetron and Treatment may vary due to age or medical condition  Airway Management Planned: Oral ETT  Additional Equipment:   Intra-op Plan:   Post-operative Plan: Extubation in OR  Informed Consent: I have reviewed the patients History and Physical, chart, labs and discussed the procedure including the risks, benefits and alternatives for the proposed anesthesia with the patient or authorized representative who has indicated his/her understanding and acceptance.   Dental advisory given  Plan Discussed with: CRNA, Anesthesiologist and Surgeon  Anesthesia Plan Comments:        Anesthesia Quick Evaluation

## 2017-06-26 NOTE — Op Note (Signed)
[facility] Patient Name: Gerald Farrell Procedure Date: 06/26/2017 MRN: [id number] Attending MD: Gatha Mayer , MD Date of Birth: [birth year] CSN: [id number] Age: 72 Admit Type: Outpatient Procedure:                ERCP Indications:              Biliary stent removal, ? stone on prior                            cholangiogram Providers:                Gatha Mayer, MD, Cleda Daub, RN, Cherylynn Ridges, Technician, Christell Faith, CRNA Referring MD:              Medicines:                General Anesthesia, Unasyn 1.5 g IV Complications:            No immediate complications. Estimated Blood Loss:     Estimated blood loss: none. Procedure:                Pre-Anesthesia Assessment:                           - Prior to the procedure, a History and Physical                            was performed, and patient medications and                            allergies were reviewed. The patient's tolerance of                            previous anesthesia was also reviewed. The risks                            and benefits of the procedure and the sedation                            options and risks were discussed with the patient.                            All questions were answered, and informed consent                            was obtained. Prior Anticoagulants: The patient has                            taken no previous anticoagulant or antiplatelet                            agents. ASA Grade Assessment: III - A patient with  severe systemic disease. After reviewing the risks                            and benefits, the patient was deemed in                            satisfactory condition to undergo the procedure.                           After obtaining informed consent, the scope was                            passed under direct vision. Throughout the                            procedure, the patient's  blood pressure, pulse, and                            oxygen saturations were monitored continuously. The                            [id number] ( [id number] ) was introduced through the                            mouth, and used to inject contrast into and used to                            inject contrast into the bile duct. The ERCP was                            accomplished without difficulty. The patient                            tolerated the procedure well. Scope In: Scope Out: Findings:      A biliary stent was visible on the scout film. Duodenoscope inseted -       esophagus not seen well. Stomach and duodenum grossly normal. Plastic       biliary stent seen exiting papilla s/p biliary shincterotomy and removed       from duct with a snare - side flap broke off and was not retrieved.       Contrast injected via sphincterotomeand no stones or leak seen though       did see what I think was some contrast pooling in intestine or stomach.       Wire placed via exchange and balloon occlusion cholangiogram without       stones/evidence of leak from bile duct. Procedure terminated. Impression:               - Stent removed                           I did not identify contrast leaking from bile duct                            with sphincterotome injection  or with occluison                            cholangiogram. No stones.                           I think there was contrast pooling in gut - leaking                            from papilla.                           Pancreas not entered. Moderate Sedation:      N/A- Per Anesthesia Care Recommendation:           - Patient has a contact number available for                            emergencies. The signs and symptoms of potential                            delayed complications were discussed with the                            patient. Return to normal activities tomorrow.                            Written discharge instructions were  provided to the                            patient.                           - Advance diet as tolerated. Liquids only today                           - Continue present medications.                           - Return to my office PRN.                           - I believe I saw contrast spilled from bile duct                            into intestine or stomach and not a leak.                           should he develop symptoms of a leak would need                            HIDA vs ERCP.                           He has a sphincterotomy so even if some residual  leak (it was wide open in April) then he should do                            ok with the sphincterotomy as that in it of itself                            can treat a leak. Procedure Code(s):        --- Professional ---                           [phone], Endoscopic retrograde                            cholangiopancreatography (ERCP); with removal of                            foreign body(s) or stent(s) from biliary/pancreatic                            duct(s) Diagnosis Code(s):        --- Professional ---                           K34.91, Encounter for fitting and adjustment of                            other gastrointestinal appliance and device CPT copyright 2017 American Medical Association. All rights reserved. The codes documented in this report are preliminary and upon coder review may  be revised to meet current compliance requirements. Gatha Mayer, MD 06/26/2017 12:56:05 PM This report has been signed electronically. Number of Addenda: 0

## 2017-06-26 NOTE — Anesthesia Postprocedure Evaluation (Signed)
Anesthesia Post Note  Patient: Gerald Farrell  Procedure(s) Performed: ENDOSCOPIC RETROGRADE CHOLANGIOPANCREATOGRAPHY (ERCP) WITH PROPOFOL (N/A ) STENT REMOVAL     Patient location during evaluation: PACU Anesthesia Type: General Level of consciousness: awake and alert Pain management: pain level controlled Vital Signs Assessment: post-procedure vital signs reviewed and stable Respiratory status: spontaneous breathing, nonlabored ventilation, respiratory function stable and patient connected to nasal cannula oxygen Cardiovascular status: blood pressure returned to baseline and stable Postop Assessment: no apparent nausea or vomiting Anesthetic complications: no    Last Vitals:  Vitals:   06/26/17 1320 06/26/17 1325  BP: (!) 157/105 (!) 166/81  Pulse: 70 65  Resp: 12 11  Temp:    SpO2: 97% 96%    Last Pain:  Vitals:   06/26/17 1325  TempSrc:   PainSc: 0-No pain                 Layah Skousen

## 2017-06-26 NOTE — Transfer of Care (Signed)
Immediate Anesthesia Transfer of Care Note  Patient: Gerald Farrell  Procedure(s) Performed: ENDOSCOPIC RETROGRADE CHOLANGIOPANCREATOGRAPHY (ERCP) WITH PROPOFOL (N/A ) STENT REMOVAL  Patient Location: PACU  Anesthesia Type:General  Level of Consciousness: awake, alert  and patient cooperative  Airway & Oxygen Therapy: Patient Spontanous Breathing and Patient connected to face mask oxygen  Post-op Assessment: Report given to RN and Post -op Vital signs reviewed and stable  Post vital signs: Reviewed and stable  Last Vitals:  Vitals Value Taken Time  BP    Temp    Pulse 74 06/26/2017 12:51 PM  Resp 8 06/26/2017 12:51 PM  SpO2 100 % 06/26/2017 12:51 PM  Vitals shown include unvalidated device data.  Last Pain:  Vitals:   06/26/17 1045  TempSrc: Oral  PainSc: 0-No pain         Complications: No apparent anesthesia complications

## 2017-06-26 NOTE — Anesthesia Procedure Notes (Signed)
Procedure Name: Intubation Date/Time: 06/26/2017 12:11 PM Performed by: West Pugh, CRNA Pre-anesthesia Checklist: Patient identified, Emergency Drugs available, Suction available, Patient being monitored and Timeout performed Patient Re-evaluated:Patient Re-evaluated prior to induction Oxygen Delivery Method: Circle system utilized Preoxygenation: Pre-oxygenation with 100% oxygen Induction Type: IV induction Ventilation: Mask ventilation without difficulty Laryngoscope Size: Mac and 3 Grade View: Grade I Tube type: Oral Tube size: 7.5 mm Number of attempts: 1 Airway Equipment and Method: Stylet Placement Confirmation: ETT inserted through vocal cords under direct vision,  positive ETCO2,  CO2 detector and breath sounds checked- equal and bilateral Secured at: 22 cm Tube secured with: Tape Dental Injury: Teeth and Oropharynx as per pre-operative assessment

## 2017-06-27 ENCOUNTER — Telehealth: Payer: Self-pay

## 2017-06-27 NOTE — Telephone Encounter (Signed)
I called to check on Gerald Farrell and he said that he's having below the belly button abdominal pain, onset around noon. Seems to be getting more severe. He took 2 Oxy and didn't seem to help a lot. No fever, no nausea, no vomiting, no diarrhea. I told him we will let Dr Carlean Purl know and be back in touch. He said he has to go see his son at the hospital and that we can reach him on his mobile # [phone].

## 2017-06-27 NOTE — Telephone Encounter (Signed)
-----   Message from Gatha Mayer, MD sent at 06/26/2017  1:05 PM EDT ----- Regarding: call him tomorrow Please call him and see if he is ok after the ERCP  Any pain etc

## 2017-06-27 NOTE — Telephone Encounter (Signed)
Spoke with Dr Carlean Purl and he is going to call Mr. Ascher.

## 2017-06-27 NOTE — Telephone Encounter (Signed)
Lower periumbilical pain different than pain with bile leak (RUQ). 8/10 Has not defecated No fever No vomiting Seeing his [facility] at psych hospital now  I suggested trying a laxative  If no relief/worse, not better go to ED  Can also call back prn

## 2017-06-28 ENCOUNTER — Telehealth: Payer: Self-pay

## 2017-06-28 NOTE — Telephone Encounter (Signed)
Patient wanted Dr. Carlean Purl that the abdominal pain has improved with laxative.

## 2017-07-02 ENCOUNTER — Encounter (HOSPITAL_COMMUNITY): Payer: Self-pay | Admitting: Internal Medicine

## 2017-07-02 MED FILL — Indomethacin Suppos 50 MG: RECTAL | Qty: 1 | Status: AC

## 2017-07-02 NOTE — OR Nursing (Signed)
Corrected Charting error of Imdomethacin administered on 06/26/17 per Pharmacy request. Incorrect charting of 1240mg , should have been 50mg  given. The procedure RN entered the time of administration as the dose given.

## 2017-07-14 ENCOUNTER — Inpatient Hospital Stay (HOSPITAL_COMMUNITY)
Admission: EM | Admit: 2017-07-14 | Discharge: 2017-08-02 | DRG: 329 | Disposition: A | Discharge status: Home / Self Care | Payer: Medicare Other | Attending: General Surgery | Admitting: General Surgery

## 2017-07-14 ENCOUNTER — Inpatient Hospital Stay (HOSPITAL_COMMUNITY): Payer: Medicare Other

## 2017-07-14 ENCOUNTER — Encounter (HOSPITAL_COMMUNITY): Payer: Self-pay

## 2017-07-14 ENCOUNTER — Other Ambulatory Visit: Payer: Self-pay

## 2017-07-14 DIAGNOSIS — R079 Chest pain, unspecified: Secondary | ICD-10-CM

## 2017-07-14 DIAGNOSIS — D62 Acute posthemorrhagic anemia: Secondary | ICD-10-CM | POA: Diagnosis not present

## 2017-07-14 DIAGNOSIS — K811 Chronic cholecystitis: Secondary | ICD-10-CM | POA: Diagnosis present

## 2017-07-14 DIAGNOSIS — R9389 Abnormal findings on diagnostic imaging of other specified body structures: Secondary | ICD-10-CM

## 2017-07-14 DIAGNOSIS — D649 Anemia, unspecified: Secondary | ICD-10-CM | POA: Diagnosis present

## 2017-07-14 DIAGNOSIS — Z0189 Encounter for other specified special examinations: Secondary | ICD-10-CM

## 2017-07-14 DIAGNOSIS — Z791 Long term (current) use of non-steroidal anti-inflammatories (NSAID): Secondary | ICD-10-CM

## 2017-07-14 DIAGNOSIS — R042 Hemoptysis: Secondary | ICD-10-CM | POA: Diagnosis not present

## 2017-07-14 DIAGNOSIS — D72829 Elevated white blood cell count, unspecified: Secondary | ICD-10-CM

## 2017-07-14 DIAGNOSIS — Z8249 Family history of ischemic heart disease and other diseases of the circulatory system: Secondary | ICD-10-CM

## 2017-07-14 DIAGNOSIS — E43 Unspecified severe protein-calorie malnutrition: Secondary | ICD-10-CM | POA: Diagnosis present

## 2017-07-14 DIAGNOSIS — E876 Hypokalemia: Secondary | ICD-10-CM | POA: Diagnosis present

## 2017-07-14 DIAGNOSIS — K567 Ileus, unspecified: Secondary | ICD-10-CM | POA: Diagnosis not present

## 2017-07-14 DIAGNOSIS — F17210 Nicotine dependence, cigarettes, uncomplicated: Secondary | ICD-10-CM | POA: Diagnosis present

## 2017-07-14 DIAGNOSIS — R112 Nausea with vomiting, unspecified: Secondary | ICD-10-CM

## 2017-07-14 DIAGNOSIS — I10 Essential (primary) hypertension: Secondary | ICD-10-CM | POA: Diagnosis present

## 2017-07-14 DIAGNOSIS — E871 Hypo-osmolality and hyponatremia: Secondary | ICD-10-CM | POA: Diagnosis present

## 2017-07-14 DIAGNOSIS — J851 Abscess of lung with pneumonia: Secondary | ICD-10-CM | POA: Diagnosis present

## 2017-07-14 DIAGNOSIS — Z85820 Personal history of malignant melanoma of skin: Secondary | ICD-10-CM | POA: Diagnosis not present

## 2017-07-14 DIAGNOSIS — J189 Pneumonia, unspecified organism: Secondary | ICD-10-CM

## 2017-07-14 DIAGNOSIS — K5651 Intestinal adhesions [bands], with partial obstruction: Secondary | ICD-10-CM | POA: Diagnosis present

## 2017-07-14 DIAGNOSIS — Z6820 Body mass index (BMI) 20.0-20.9, adult: Secondary | ICD-10-CM | POA: Diagnosis not present

## 2017-07-14 DIAGNOSIS — Z9889 Other specified postprocedural states: Secondary | ICD-10-CM

## 2017-07-14 DIAGNOSIS — J181 Lobar pneumonia, unspecified organism: Secondary | ICD-10-CM

## 2017-07-14 DIAGNOSIS — G8929 Other chronic pain: Secondary | ICD-10-CM | POA: Diagnosis present

## 2017-07-14 DIAGNOSIS — R937 Abnormal findings on diagnostic imaging of other parts of musculoskeletal system: Secondary | ICD-10-CM | POA: Diagnosis not present

## 2017-07-14 DIAGNOSIS — R05 Cough: Secondary | ICD-10-CM

## 2017-07-14 DIAGNOSIS — K56609 Unspecified intestinal obstruction, unspecified as to partial versus complete obstruction: Secondary | ICD-10-CM | POA: Diagnosis present

## 2017-07-14 DIAGNOSIS — K920 Hematemesis: Secondary | ICD-10-CM

## 2017-07-14 DIAGNOSIS — R059 Cough, unspecified: Secondary | ICD-10-CM

## 2017-07-14 DIAGNOSIS — R933 Abnormal findings on diagnostic imaging of other parts of digestive tract: Secondary | ICD-10-CM

## 2017-07-14 DIAGNOSIS — R935 Abnormal findings on diagnostic imaging of other abdominal regions, including retroperitoneum: Secondary | ICD-10-CM

## 2017-07-14 LAB — BASIC METABOLIC PANEL
Anion gap: 11 (ref 5–15)
Anion gap: 8 (ref 5–15)
BUN: 7 mg/dL — AB (ref 8–23)
BUN: 6 mg/dL — AB (ref 8–23)
CHLORIDE: 100 mmol/L (ref 98–111)
CO2: 27 mmol/L (ref 22–32)
CO2: 26 mmol/L (ref 22–32)
CREATININE: 0.63 mg/dL (ref 0.61–1.24)
CREATININE: 0.52 mg/dL — AB (ref 0.61–1.24)
Calcium: 7.9 mg/dL — ABNORMAL LOW (ref 8.9–10.3)
Calcium: 7.4 mg/dL — ABNORMAL LOW (ref 8.9–10.3)
Chloride: 96 mmol/L — ABNORMAL LOW (ref 98–111)
GFR calc Af Amer: >60 mL/min (ref >60)
GFR calc Af Amer: >60 mL/min (ref >60)
GFR calc non Af Amer: >60 mL/min (ref >60)
GLUCOSE: 129 mg/dL — AB (ref 70–99)
Glucose, Bld: 107 mg/dL — ABNORMAL HIGH (ref 70–99)
POTASSIUM: 2.5 mmol/L — AB (ref 3.5–5.1)
Potassium: 3.1 mmol/L — ABNORMAL LOW (ref 3.5–5.1)
SODIUM: 134 mmol/L — AB (ref 135–145)
SODIUM: 134 mmol/L — AB (ref 135–145)

## 2017-07-14 LAB — LIPASE, BLOOD: Lipase: 21 U/L (ref 11–51)

## 2017-07-14 LAB — HEPATIC FUNCTION PANEL
ALT: 30 U/L (ref 0–44)
AST: 43 U/L — AB (ref 15–41)
Albumin: 1.9 g/dL — ABNORMAL LOW (ref 3.5–5.0)
Alkaline Phosphatase: 81 U/L (ref 38–126)
BILIRUBIN DIRECT: 0.3 mg/dL — AB (ref 0.0–0.2)
BILIRUBIN TOTAL: 0.9 mg/dL (ref 0.3–1.2)
Indirect Bilirubin: 0.6 mg/dL (ref 0.3–0.9)
Total Protein: 5.7 g/dL — ABNORMAL LOW (ref 6.5–8.1)

## 2017-07-14 LAB — CBC WITH DIFFERENTIAL/PLATELET
ABS IMMATURE GRANULOCYTES: 0.2 10*3/uL — AB (ref 0.0–0.1)
Basophils Absolute: 0 10*3/uL (ref 0.0–0.1)
Basophils Relative: 0 %
Eosinophils Absolute: 0 10*3/uL (ref 0.0–0.7)
Eosinophils Relative: 0 %
HCT: 32.4 % — ABNORMAL LOW (ref 39.0–52.0)
Hemoglobin: 10.7 g/dL — ABNORMAL LOW (ref 13.0–17.0)
IMMATURE GRANULOCYTES: 1 %
LYMPHS ABS: 0.7 10*3/uL (ref 0.7–4.0)
Lymphocytes Relative: 3 %
MCH: 30.1 pg (ref 26.0–34.0)
MCHC: 33 g/dL (ref 30.0–36.0)
MCV: 91 fL (ref 78.0–100.0)
Monocytes Absolute: 1.1 10*3/uL — ABNORMAL HIGH (ref 0.1–1.0)
Monocytes Relative: 5 %
NEUTROS ABS: 18.7 10*3/uL — AB (ref 1.7–7.7)
Neutrophils Relative %: 91 %
PLATELETS: 345 10*3/uL (ref 150–400)
RBC: 3.56 MIL/uL — ABNORMAL LOW (ref 4.22–5.81)
RDW: 13.5 % (ref 11.5–15.5)
WBC: 20.8 10*3/uL — AB (ref 4.0–10.5)

## 2017-07-14 LAB — MAGNESIUM: MAGNESIUM: 1.5 mg/dL — AB (ref 1.7–2.4)

## 2017-07-14 LAB — TYPE AND SCREEN
ABO/RH(D): O POS
Antibody Screen: NEGATIVE

## 2017-07-14 LAB — ABO/RH: ABO/RH(D): O POS

## 2017-07-14 MED ORDER — ONDANSETRON HCL 4 MG/2ML IJ SOLN
4.0000 mg | Freq: Four times a day (QID) | INTRAMUSCULAR | Status: DC | PRN
  Filled 2017-07-14: qty 2

## 2017-07-14 MED ORDER — ENOXAPARIN SODIUM 30 MG/0.3ML ~~LOC~~ SOLN
30.0000 mg | SUBCUTANEOUS | Status: DC
  Administered 2017-07-14 – 2017-07-17 (×4): 30 mg via SUBCUTANEOUS
  Filled 2017-07-14 (×4): qty 0.3

## 2017-07-14 MED ORDER — ONDANSETRON 4 MG PO TBDP: 4.0000 mg | ORAL_TABLET | Freq: Four times a day (QID) | ORAL | Status: DC | PRN

## 2017-07-14 MED ORDER — DIATRIZOATE MEGLUMINE & SODIUM 66-10 % PO SOLN
90.0000 mL | Freq: Once | ORAL | Status: AC
  Administered 2017-07-14: 90 mL via NASOGASTRIC
  Filled 2017-07-14: qty 90

## 2017-07-14 MED ORDER — LISINOPRIL 20 MG PO TABS
40.0000 mg | ORAL_TABLET | Freq: Two times a day (BID) | ORAL | Status: DC
  Administered 2017-07-14 – 2017-07-17 (×8): 40 mg via ORAL
  Filled 2017-07-14 (×9): qty 1

## 2017-07-14 MED ORDER — GABAPENTIN 400 MG PO CAPS
800.0000 mg | ORAL_CAPSULE | Freq: Three times a day (TID) | ORAL | Status: DC
  Administered 2017-07-14 – 2017-07-17 (×12): 800 mg via ORAL
  Filled 2017-07-14 (×13): qty 2

## 2017-07-14 MED ORDER — TAMSULOSIN HCL 0.4 MG PO CAPS
0.4000 mg | ORAL_CAPSULE | Freq: Every day | ORAL | Status: DC
  Administered 2017-07-14 – 2017-07-17 (×4): 0.4 mg via ORAL
  Filled 2017-07-14 (×5): qty 1

## 2017-07-14 MED ORDER — MORPHINE SULFATE (PF) 4 MG/ML IV SOLN
1.0000 mg | INTRAVENOUS | Status: DC | PRN
  Administered 2017-07-14 – 2017-07-16 (×10): 1 mg via INTRAVENOUS
  Filled 2017-07-14 (×10): qty 1

## 2017-07-14 MED ORDER — CLONIDINE HCL 0.1 MG PO TABS
0.1000 mg | ORAL_TABLET | Freq: Three times a day (TID) | ORAL | Status: DC
  Administered 2017-07-14 – 2017-07-17 (×12): 0.1 mg via ORAL
  Filled 2017-07-14 (×13): qty 1

## 2017-07-14 MED ORDER — KCL IN DEXTROSE-NACL 20-5-0.45 MEQ/L-%-% IV SOLN
INTRAVENOUS | Status: DC
  Administered 2017-07-14 – 2017-07-15 (×3): via INTRAVENOUS
  Filled 2017-07-14 (×3): qty 1000

## 2017-07-14 MED ORDER — LORAZEPAM 2 MG/ML IJ SOLN
0.5000 mg | Freq: Once | INTRAMUSCULAR | Status: AC
  Administered 2017-07-14: 0.5 mg via INTRAVENOUS
  Filled 2017-07-14: qty 1

## 2017-07-14 MED ORDER — POTASSIUM CHLORIDE 10 MEQ/100ML IV SOLN
10.0000 meq | INTRAVENOUS | Status: AC
  Administered 2017-07-14 (×3): 10 meq via INTRAVENOUS
  Filled 2017-07-14 (×3): qty 100

## 2017-07-14 MED ORDER — SODIUM CHLORIDE 0.9 % IV BOLUS
1000.0000 mL | Freq: Once | INTRAVENOUS | Status: AC
  Administered 2017-07-14: 1000 mL via INTRAVENOUS

## 2017-07-14 MED ORDER — FAMOTIDINE IN NACL 20-0.9 MG/50ML-% IV SOLN
20.0000 mg | Freq: Two times a day (BID) | INTRAVENOUS | Status: DC
  Administered 2017-07-14 (×2): 20 mg via INTRAVENOUS
  Filled 2017-07-14 (×2): qty 50

## 2017-07-14 MED ORDER — PIPERACILLIN-TAZOBACTAM 3.375 G IVPB
3.3750 g | Freq: Three times a day (TID) | INTRAVENOUS | Status: DC
  Administered 2017-07-14 – 2017-07-25 (×34): 3.375 g via INTRAVENOUS
  Filled 2017-07-14 (×35): qty 50

## 2017-07-14 MED ORDER — ONDANSETRON HCL 4 MG/2ML IJ SOLN
4.0000 mg | Freq: Once | INTRAMUSCULAR | Status: AC
  Administered 2017-07-14: 4 mg via INTRAVENOUS
  Filled 2017-07-14: qty 2

## 2017-07-14 MED ORDER — AMLODIPINE BESYLATE 5 MG PO TABS
5.0000 mg | ORAL_TABLET | Freq: Every day | ORAL | Status: DC
  Administered 2017-07-14 – 2017-07-17 (×4): 5 mg via ORAL
  Filled 2017-07-14 (×5): qty 1

## 2017-07-14 MED ORDER — ACETAMINOPHEN 325 MG PO TABS
650.0000 mg | ORAL_TABLET | Freq: Four times a day (QID) | ORAL | Status: DC | PRN
  Administered 2017-07-14: 650 mg via ORAL
  Filled 2017-07-14: qty 2

## 2017-07-14 MED ORDER — HYDROCHLOROTHIAZIDE 25 MG PO TABS
25.0000 mg | ORAL_TABLET | Freq: Every day | ORAL | Status: DC
  Administered 2017-07-14 – 2017-07-17 (×4): 25 mg via ORAL
  Filled 2017-07-14 (×5): qty 1

## 2017-07-14 MED ORDER — CARVEDILOL 25 MG PO TABS
25.0000 mg | ORAL_TABLET | Freq: Two times a day (BID) | ORAL | Status: DC
  Administered 2017-07-14 – 2017-07-18 (×8): 25 mg via ORAL
  Filled 2017-07-14 (×8): qty 1

## 2017-07-14 MED ORDER — MORPHINE SULFATE (PF) 4 MG/ML IV SOLN
4.0000 mg | Freq: Once | INTRAVENOUS | Status: AC
  Administered 2017-07-14: 4 mg via INTRAVENOUS
  Filled 2017-07-14: qty 1

## 2017-07-14 NOTE — Progress Notes (Signed)
Xray staff called concerned about prev. abd xray done earlier at 1132am regarding R upper lobe consolidation, recommending 2 view chest xray. Notified on call Dr Georgette Dover, no new orders made.

## 2017-07-14 NOTE — ED Notes (Signed)
Patient is requesting "something to relax" him before attempting NG tube. He states "I just dont know that I can go through it again"

## 2017-07-14 NOTE — Progress Notes (Signed)
Pt pulled out his NG tube x 2 this day, notified Dr. Georgette Dover, he advised to leave the NG tube out, at this time.

## 2017-07-14 NOTE — H&P (Addendum)
Gerald Farrell is an 72 y.o. male.   Chief Complaint: abd pain HPI: [name] with history of hypertension, tobacco use and recent melanoma removal status post laparoscopic partial cholecystectomy April 29, 2017 for gangrenous cholecystitis was transferred from [facility] for abdominal pain, small bowel obstruction and hematemesis.  He underwent partial cholecystectomy in late April of this year for gangrenous cholecystitis.  Due to severe inflammation the neck of the gallbladder was not removed during surgery.  He had a Endoloop placed around the infundibulum and everything above that was removed.  A surgical drain was placed.  As expected he developed a postoperative bile leak.  He underwent ERCP, sphincterotomy and biliary stent placement on April 30 by Dr. Arelia Longest.  He was discharged on May 2.  He states that he recovered well.  He underwent stent removal on June 26 by Dr. Arelia Longest.  There was some mention of part of the stent breaking off during retrieval however cholangiogram during this procedure revealed no evidence of bile leak or extravasation of contrast.  The patient states that ever since the stent was removed he has had intermittent episodes of mid abdominal pain several times a week.  Intermittent in nature.  Lasting for several hours.  But generally goes away.  No particular attributing factors.  On Saturday he had another episode of discomfort but the pain got so severe he could not tolerate it he also had hematemesis.  So he went to the emergency room.  At outside hospital he was found to be hypokalemic.  He had a leukocytosis.  He had a CT scan which demonstrated small bowel obstruction with a question of an internal hernia through mesenteric defect.  There was evidence of retained gallbladder on imaging as well (as expected).  He was transferred here since his surgical care was done here  He reports that he has been having bowel movements but denies  melena or hematochezia.  He does smoke.  Past Medical History:  Diagnosis Date  . Acute cholecystitis 04/27/2017  . Alcohol abuse   . Basal cell carcinoma   . Hypertension     Past Surgical History:  Procedure Laterality Date  . BASAL CELL CARCINOMA EXCISION    . CHOLECYSTECTOMY N/A 04/28/2017   Procedure: LAPAROSCOPIC CHOLECYSTECTOMY;  Surgeon: Ralene Ok, MD;  Location: [facility];  Service: General;  Laterality: N/A;  . ENDOSCOPIC RETROGRADE CHOLANGIOPANCREATOGRAPHY (ERCP) WITH PROPOFOL N/A 06/26/2017   Procedure: ENDOSCOPIC RETROGRADE CHOLANGIOPANCREATOGRAPHY (ERCP) WITH PROPOFOL;  Surgeon: Gatha Mayer, MD;  Location: [facility] ENDOS[facility];  Service: Endoscopy;  Laterality: N/A;  . ERCP N/A 04/30/2017   Procedure: ENDOSCOPIC RETROGRADE CHOLANGIOPANCREATOGRAPHY (ERCP);  Surgeon: Gatha Mayer, MD;  [facility]: [facility] ENDOSCOPY;  Service: Endoscopy;  Laterality: N/A;  . LUMBAR LAMINECTOMY    . REPLACEMENT TOTAL KNEE BILATERAL    . STENT REMOVAL  06/26/2017   Procedure: STENT REMOVAL;  Surgeon: Gatha Mayer, MD;  Location: [facility];  Service: Endoscopy;;    Family History  Problem Relation Age of Onset  . Heart disease Mother   . Heart disease Father    Social History:  reports that he has been smoking cigarettes.  He has been smoking about 0.30 packs per day. He has never used smokeless tobacco. He reports that he drank alcohol. He reports that he has current or past drug history.  Allergies: No Known Allergies   (Not in a hospital admission)  Results for orders placed or performed during the hospital encounter  of 07/14/17 (from the past 48 hour(s))  Basic metabolic panel     Status: Abnormal   Collection Time: 07/14/17  5:03 AM  Result Value Ref Range   Sodium 134 (L) 135 - 145 mmol/L   Potassium 2.5 (LL) 3.5 - 5.1 mmol/L    Comment: CRITICAL RESULT CALLED TO, READ BACK BY AND VERIFIED WITH: B.DAVID,RN 0315 07/14/17 G.MCADOO    Chloride 96 (L) 98 - 111 mmol/L    Comment:  Please note change in reference range.   CO2 27 22 - 32 mmol/L   Glucose, Bld 129 (H) 70 - 99 mg/dL    Comment: Please note change in reference range.   BUN 7 (L) 8 - 23 mg/dL    Comment: Please note change in reference range.   Creatinine, Ser 0.63 0.61 - 1.24 mg/dL   Calcium 7.9 (L) 8.9 - 10.3 mg/dL   GFR calc non Af Amer >60 >60 mL/min   GFR calc Af Amer >60 >60 mL/min    Comment: (NOTE) The eGFR has been calculated using the CKD EPI equation. This calculation has not been validated in all clinical situations. eGFR's persistently <60 mL/min signify possible Chronic Kidney Disease.    Anion gap 11 5 - 15    Comment: Performed at [facility] [address], [place], [place] [address]  CBC with Differential     Status: Abnormal   Collection Time: 07/14/17  5:03 AM  Result Value Ref Range   WBC 20.8 (H) 4.0 - 10.5 K/uL   RBC 3.56 (L) 4.22 - 5.81 MIL/uL   Hemoglobin 10.7 (L) 13.0 - 17.0 g/dL   HCT 32.4 (L) 39.0 - 52.0 %   MCV 91.0 78.0 - 100.0 fL   MCH 30.1 26.0 - 34.0 pg   MCHC 33.0 30.0 - 36.0 g/dL   RDW 13.5 11.5 - 15.5 %   Platelets 345 150 - 400 K/uL   Neutrophils Relative % 91 %   Neutro Abs 18.7 (H) 1.7 - 7.7 K/uL   Lymphocytes Relative 3 %   Lymphs Abs 0.7 0.7 - 4.0 K/uL   Monocytes Relative 5 %   Monocytes Absolute 1.1 (H) 0.1 - 1.0 K/uL   Eosinophils Relative 0 %   Eosinophils Absolute 0.0 0.0 - 0.7 K/uL   Basophils Relative 0 %   Basophils Absolute 0.0 0.0 - 0.1 K/uL   Immature Granulocytes 1 %   Abs Immature Granulocytes 0.2 (H) 0.0 - 0.1 K/uL    Comment: Performed at [facility] [address] [place], [place] [address]  Type and screen [place]     Status: None   Collection Time: 07/14/17  5:25 AM  Result Value Ref Range   ABO/RH(D) O POS    Antibody Screen NEG    Sample Expiration      07/17/2017 Performed at [facility], [place] [address], [place], [place] [address]   ABO/Rh     Status: None (Preliminary  result)   Collection Time: 07/14/17  5:25 AM  Result Value Ref Range   ABO/RH(D)      O POS Performed at [facility] [address], [place], [place] [address]    No results found.  Review of Systems  Constitutional: Negative for weight loss.  HENT: Negative for nosebleeds.   Eyes: Negative for blurred vision.  Respiratory: Negative for shortness of breath.   Cardiovascular: Negative for chest pain, palpitations, orthopnea and PND.  Denies DOE  Gastrointestinal: Positive for abdominal pain, nausea and vomiting. Negative for blood in stool, constipation and melena.  Genitourinary: Negative for dysuria and hematuria.  Musculoskeletal: Negative.   Skin: Negative for itching and rash.  Neurological: Negative for dizziness, focal weakness, seizures, loss of consciousness and headaches.       Denies TIAs, amaurosis fugax  Endo/Heme/Allergies: Does not bruise/bleed easily.  Psychiatric/Behavioral: The patient is not nervous/anxious.     Blood pressure (!) 141/72, pulse 78, temperature 99.5 F (37.5 C), temperature source Oral, resp. rate 17, height '5\' 9"'$  (1.753 m), weight 62.6 kg (138 lb), SpO2 99 %. Physical Exam  Vitals reviewed. Constitutional: He is oriented to person, place, and time. He appears well-developed and well-nourished. No distress.  A little frail appearing; nontoxic.   HENT:  Head: Normocephalic and atraumatic.  Right Ear: External ear normal.  Left Ear: External ear normal.  Eyes: Conjunctivae are normal. No scleral icterus.  Neck: Normal range of motion. Neck supple. No tracheal deviation present. No thyromegaly present.  Cardiovascular: Normal rate and normal heart sounds.  Respiratory: Effort normal and breath sounds normal. No stridor. No respiratory distress. He has no wheezes.  GI: Soft. He exhibits no distension. There is no rebound and no guarding.  Well healed incisions; ND, very mild TTP in abd; def no rebound/guarding/peritonitis.    Musculoskeletal: He exhibits no edema or tenderness.  Lymphadenopathy:    He has no cervical adenopathy.  Neurological: He is alert and oriented to person, place, and time. He exhibits normal muscle tone.  Skin: Skin is warm and dry. No rash noted. He is not diaphoretic. No erythema. No pallor.  Psychiatric: He has a normal mood and affect. His behavior is normal. Judgment and thought content normal.     Assessment/Plan Small bowel obstruction Hypokalemia Anemia of unclear etiology Hypertension Tobacco use Chronic pain Status post laparoscopic partial cholecystectomy April 29, 2017 by Dr Rosendo Gros complicated by bile leak managed with biliary stent which has been removed  I reviewed his imaging personally as well as discussed it with Dr Weber Cooks in radiology I also reviewed his operative note.  He had a partial or subtotal cholecystectomy.  I do not believe he has a biloma or bile leak.  I believe the saclike structure seen in the gallbladder fossa is the remaining portion of the gallbladder that was left behind.  On his follow-up ERCP there was no evidence of bile leak.  What is unusual is the small bowel findings on CT imaging.  It does appear that a large portion of his small bowel is clustered in the right upper quadrant.  It is unclear how he developed a potential omental defect during a laparoscopic cholecystectomy.  There is no bowel wall thickening, portal venous gas or pneumatosis.  His abdominal exam is relatively benign.  I am not sure what to make of his hematemesis reportedly  Place nasogastric tube Replace potassium Repeat metabolic panel this afternoon-may need ongoing potassium replacement GI consult Can have home blood pressure medicines and clamp NG tube when administered Unclear etiology of leukocytosis - ?Small bowel; very little concern for bile leak/biloma. will start empiric zosyn Repeat CBC in am.   Since he does not have a surgical abdomen, I think we can try  our small bowel protocol  He may ultimately diagnostic laparoscopy however  Discussed with on-call surgeon for today  Leighton Ruff. Redmond Pulling, MD, FACS General, Bariatric, & Minimally Invasive Surgery [facility], [place]  Greer Pickerel, MD  07/14/2017, 8:06 AM

## 2017-07-14 NOTE — Consult Note (Signed)
Consultation  Referring Provider:     Dr. Redmond Pulling, [place] Primary Care Physician:  Patient, No Pcp Per Primary Gastroenterologist:     Dr. Carlean Purl   Fabienne Bruns at [facility] in [facility] Reason for Consultation:                 HPI:   Gerald Farrell is a 72 y.o. male with history of gangrenous cholecystitis status post subtotal cholecystectomy April 7893, complicated by bile leak status post ERCP with plastic stent placement and resolution of bile leak.  Patient had stent removal June 26, 2017, no leak was seen post removal of the plastic stent.  1 of the side flaps broke off from the plastic stent and was not retrieved.  Patient states that he has been having mid abdominal pain since he had the biliary stent removed.  He is having nausea and vomiting for past few days and had an episode of coffee-ground emesis prior to presenting to ER.  Denies any melena, fever, jaundice, dysphagia.  He does not have much appetite. Noted to have leukocytosis WBC 20.8.  Bilirubin normal at 0.6, AST 43, ALT 30 and alk phos 81. CT abdomen and pelvis done at [place] showed saclike structure in the gallbladder fossa and possible ?  Biloma.  He also had small bowel clustered in the right upper quadrant with herniation through omental defect    Past Medical History:  Diagnosis Date  . Acute cholecystitis 04/27/2017  . Alcohol abuse   . Basal cell carcinoma   . Hypertension     Past Surgical History:  Procedure Laterality Date  . BASAL CELL CARCINOMA EXCISION    . CHOLECYSTECTOMY N/A 04/28/2017   Procedure: LAPAROSCOPIC CHOLECYSTECTOMY;  Surgeon: Ralene Ok, MD;  Location: [facility];  Service: General;  Laterality: N/A;  . ENDOSCOPIC RETROGRADE CHOLANGIOPANCREATOGRAPHY (ERCP) WITH PROPOFOL N/A 06/26/2017   Procedure: ENDOSCOPIC RETROGRADE CHOLANGIOPANCREATOGRAPHY (ERCP) WITH PROPOFOL;  Surgeon: Gatha Mayer, MD;  Location: [facility];  Service: Endoscopy;   Laterality: N/A;  . ERCP N/A 04/30/2017   Procedure: ENDOSCOPIC RETROGRADE CHOLANGIOPANCREATOGRAPHY (ERCP);  Surgeon: Gatha Mayer, MD;  Location: [facility] ENDOSCOPY;  Service: Endoscopy;  Laterality: N/A;  . LUMBAR LAMINECTOMY    . REPLACEMENT TOTAL KNEE BILATERAL    . STENT REMOVAL  06/26/2017   Procedure: STENT REMOVAL;  Surgeon: Gatha Mayer, MD;  Location: [facility];  Service: Endoscopy;;    Family History  Problem Relation Age of Onset  . Heart disease Mother   . Heart disease Father      Social History   Tobacco Use  . Smoking status: Current Every Day Smoker    Packs/day: 0.30    Types: Cigarettes  . Smokeless tobacco: Never Used  Substance Use Topics  . Alcohol use: Not Currently  . Drug use: Not Currently    Prior to Admission medications   Medication Sig Start Date End Date Taking? Authorizing Provider  acetaminophen (TYLENOL) 325 MG tablet Take 650 mg by mouth every 6 (six) hours as needed for moderate pain or headache.   Yes [provider]  amLODipine (NORVASC) 10 MG tablet Take 5 mg by mouth daily.   Yes [provider]  carvedilol (COREG) 25 MG tablet Take 25 mg by mouth 2 (two) times daily with a meal.   Yes [provider]  cloNIDine (CATAPRES) 0.1 MG tablet Take 0.1 mg by mouth 3 (three) times daily.   Yes [provider]  ENSURE (ENSURE) Take 0.5 Cans by mouth 3 (three) times daily between meals.   Yes [provider]  gabapentin (NEURONTIN) 400 MG capsule Take 800 mg by mouth 3 (three) times daily.   Yes [provider]  hydrochlorothiazide (HYDRODIURIL) 25 MG tablet Take 25 mg by mouth daily.   Yes [provider]  ibuprofen (ADVIL,MOTRIN) 200 MG tablet Take 600 mg by mouth every 6 (six) hours as needed for moderate pain.    Yes [provider]  ketoconazole (NIZORAL) 2 % cream Apply 1 application topically daily as needed for irritation.   Yes [provider]  lisinopril  (PRINIVIL,ZESTRIL) 40 MG tablet Take 40 mg by mouth 2 (two) times daily.    Yes [provider]  meloxicam (MOBIC) 15 MG tablet Take 15 mg by mouth daily.   Yes [provider]  methocarbamol (ROBAXIN) 750 MG tablet Take 750 mg by mouth 3 (three) times daily as needed for muscle spasms.    Yes [provider]  omeprazole (PRILOSEC) 20 MG capsule Take 40 mg by mouth daily.    Yes [provider]  oxycodone (OXY-IR) 5 MG capsule Take 5 mg by mouth 3 (three) times daily as needed for pain.   Yes [provider]  Potassium Chloride ER 20 MEQ TBCR Take 20 mEq by mouth daily. 06/21/17  Yes Gatha Mayer, MD  tamsulosin (FLOMAX) 0.4 MG CAPS capsule Take 0.4 mg by mouth daily.   Yes [provider]    Current Facility-Administered Medications  Medication Dose Route Frequency Provider Last Rate Last Dose  . amLODipine (NORVASC) tablet 5 mg  5 mg Oral Daily Greer Pickerel, MD   5 mg at 07/14/17 1130  . carvedilol (COREG) tablet 25 mg  25 mg Oral BID WC Greer Pickerel, MD      . cloNIDine (CATAPRES) tablet 0.1 mg  0.1 mg Oral TID Greer Pickerel, MD   0.1 mg at 07/14/17 1129  . dextrose 5 % and 0.45 % NaCl with KCl 20 mEq/L infusion   Intravenous Continuous Greer Pickerel, MD 100 mL/hr at 07/14/17 1120    . diatrizoate meglumine-sodium (GASTROGRAFIN) 66-10 % solution 90 mL  90 mL Per NG tube Once Greer Pickerel, MD      . enoxaparin (LOVENOX) injection 30 mg  30 mg Subcutaneous Q24H Greer Pickerel, MD      . famotidine (PEPCID) IVPB 20 mg premix  20 mg Intravenous Jolyn Nap, MD 100 mL/hr at 07/14/17 1126 20 mg at 07/14/17 1126  . gabapentin (NEURONTIN) capsule 800 mg  800 mg Oral TID Greer Pickerel, MD   800 mg at 07/14/17 1126  . hydrochlorothiazide (HYDRODIURIL) tablet 25 mg  25 mg Oral Daily Greer Pickerel, MD   25 mg at 07/14/17 1127  . lisinopril (PRINIVIL,ZESTRIL) tablet 40 mg  40 mg Oral BID Greer Pickerel, MD   40 mg at 07/14/17 1129  . morphine 4 MG/ML  injection 1 mg  1 mg Intravenous Q2H PRN Greer Pickerel, MD      . ondansetron (ZOFRAN-ODT) disintegrating tablet 4 mg  4 mg Oral Q6H PRN Greer Pickerel, MD       Or  . ondansetron [facility]) injection 4 mg  4 mg Intravenous Q6H PRN Greer Pickerel, MD      . piperacillin-tazobactam (ZOSYN) IVPB 3.375 g  3.375 g Intravenous Q8H Tyrone Apple, [facility]   Stopped at 07/14/17 1124  . tamsulosin (FLOMAX) capsule 0.4 mg  0.4  mg Oral Daily Greer Pickerel, MD   0.4 mg at 07/14/17 1129    Allergies as of 07/14/2017  . (No Known Allergies)     Review of Systems:    This is positive for those things mentioned in the HPI All other review of systems are negative.       Physical Exam:  Vital signs in last 24 hours: Temp:  [99.5 F (37.5 C)-100.2 F (37.9 C)] 100.2 F (37.9 C) (07/14 1030) Pulse Rate:  [73-88] 88 (07/14 1030) Resp:  [10-18] 18 (07/14 1030) BP: (117-159)/(66-105) 151/84 (07/14 1030) SpO2:  [97 %-100 %] 100 % (07/14 1030) Weight:  [62.6 kg (138 lb)] 62.6 kg (138 lb) (07/14 0416)    General:  NAD Eyes:  anicteric. Lungs: Clear to auscultation bilaterally. Heart:              S1S2, no rubs, murmurs, gallops. Abdomen:  soft, distended, mild tender, no hepatosplenomegaly, hernia, or mass and BS+.  Extremities:   no edema Neuro:  A&O x 3.  Psych:  appropriate mood and  Affect.   Data Reviewed:   LAB RESULTS: Recent Labs    07/14/17 0503  WBC 20.8*  HGB 10.7*  HCT 32.4*  PLT 345   BMET Recent Labs    07/14/17 0503 07/14/17 0943  NA 134* 134*  K 2.5* 3.1*  CL 96* 100  CO2 27 26  GLUCOSE 129* 107*  BUN 7* 6*  CREATININE 0.63 0.52*  CALCIUM 7.9* 7.4*   LFT Recent Labs    07/14/17 0943  PROT 5.7*  ALBUMIN 1.9*  AST 43*  ALT 30  ALKPHOS 81  BILITOT 0.9  BILIDIR 0.3*  IBILI 0.6   PT/INR No results for input(s): LABPROT, INR in the last 72 hours.  STUDIES: Dg Abd Portable 1 View  Result Date: 07/14/2017 CLINICAL DATA:  NG placement EXAM: PORTABLE ABDOMEN - 1  VIEW COMPARISON:  CT abdomen pelvis 07/14/2017 FINDINGS: NG has been placed with the tip in the body of the stomach. Nondilated large and small bowel loops are present. IMPRESSION: NG tube in the stomach. Electronically Signed   By: Franchot Gallo M.D.   On: 07/14/2017 09:40     PREVIOUS ENDOSCOPIES:            None    Impression / Plan:   73 year old male with history of gangrenous cholecystitis status post subtotal cholecystectomy in April 4401 complicated by bile leak status post ERCP with plastic stent placement, resolution of bile leak and stent removal June 26, 2017 admitted with abdominal pain , nausea vomiting and coffee-ground emesis Evidence of small bowel obstruction with transition in right upper quadrant, herniation of small bowel loops through omental defect. Saclike structure in the gallbladder fossa likely remnant from subtotal cholecystectomy, less likely to be biloma given bilirubin and LFT within normal limits. Has elevated WBC count, continue to monitor.  If no other source for significant leukocytosis, may need to consider percutaneous drainage to exclude contained abscess in gallbladder fossa Vomiting and coffee-ground emesis in the setting of small bowel obstruction, does not have significant drop in hemoglobin PPI twice daily NG tube Continue empiric antibiotics     K. Denzil Magnuson , MD [phone]

## 2017-07-14 NOTE — Progress Notes (Signed)
Phoned radiology and advised them of the time of instilling the Pt's gastrografin for the 8 hr delay f/u abd xray to be done at 8:15 PM.

## 2017-07-14 NOTE — Progress Notes (Signed)
Pharmacy Antibiotic Note  Gerald Farrell is a 72 y.o. male transferred from [place] on 07/14/2017 with SBO.  Patient had a cholecystectomy several months ago and post-op course was complicated with bile duct leak and treated with stent placement.  Pharmacy has been consulted for Zosyn dosing.  SCr 0.63, CrCL 77 ml/min, afebrile, WBC 20.8.  Plan: Zosyn EID 3.375gm IV Q8H Pharmacy will sign off as dosage adjustment is likely unnecessary.  Thank you for the consult!   Height: 5\' 9"  (175.3 cm) Weight: 138 lb (62.6 kg) IBW/kg (Calculated) : 70.7  Temp (24hrs), Avg:99.5 F (37.5 C), Min:99.5 F (37.5 C), Max:99.5 F (37.5 C)  Recent Labs  Lab 07/14/17 0503  WBC 20.8*  CREATININE 0.63    Estimated Creatinine Clearance: 77.2 mL/min (by C-G formula based on SCr of 0.63 mg/dL).    No Known Allergies    Chasmine Lender D. Mina Marble, PharmD, BCPS, [place] 07/14/2017, 8:17 AM

## 2017-07-14 NOTE — ED Notes (Signed)
K+ level is 2.5

## 2017-07-14 NOTE — ED Provider Notes (Signed)
[place] EMERGENCY DEPARTMENT Provider Note   CSN: [id number] Arrival date & time: 07/14/17  0406     History   Chief Complaint Chief Complaint  Patient presents with  . Post-op Problem  . sbo    HPI Gerald Farrell is a 72 y.o. male.  The history is provided by the patient.  He has history of hypertension, cholecystectomy with bile duct leak and was transferred here from [facility] because of small bowel obstruction found on CT scan.  He states he had a cholecystectomy several months ago, complicated by bile duct leak which was treated with a stent placement.  Stent was removed about 2 weeks ago.  He has had constant abdominal pain since then, but pain got much worse tonight.  He vomited multiple times and states that he did have body emesis after the first several episodes of emesis.  He rates his pain at 10/10.  He denies fever, chills, sweats.  He states that he felt too ill to come here directly so went to [facility] where CT scan demonstrated small bowel obstruction.  ED physician there did speak with general surgery here who has accepted him in transfer.  Past Medical History:  Diagnosis Date  . Acute cholecystitis 04/27/2017  . Alcohol abuse   . Basal cell carcinoma   . Hypertension     Patient Active Problem List   Diagnosis Date Noted  . Abnormal cholangiogram   . Bile duct leak     Past Surgical History:  Procedure Laterality Date  . BASAL CELL CARCINOMA EXCISION    . CHOLECYSTECTOMY N/A 04/28/2017   Procedure: LAPAROSCOPIC CHOLECYSTECTOMY;  Surgeon: Ralene Ok, MD;  Location: [place];  Service: General;  Laterality: N/A;  . ENDOSCOPIC RETROGRADE CHOLANGIOPANCREATOGRAPHY (ERCP) WITH PROPOFOL N/A 06/26/2017   Procedure: ENDOSCOPIC RETROGRADE CHOLANGIOPANCREATOGRAPHY (ERCP) WITH PROPOFOL;  Surgeon: Gatha Mayer, MD;  Location: [facility] ENDOSCOPY;  Service: Endoscopy;  Laterality: N/A;  . ERCP N/A 04/30/2017   Procedure: ENDOSCOPIC RETROGRADE CHOLANGIOPANCREATOGRAPHY (ERCP);  Surgeon: Gatha Mayer, MD;  Location: [facility] ENDOSCOPY;  Service: Endoscopy;  Laterality: N/A;  . LUMBAR LAMINECTOMY    . REPLACEMENT TOTAL KNEE BILATERAL    . STENT REMOVAL  06/26/2017   Procedure: STENT REMOVAL;  Surgeon: Gatha Mayer, MD;  Location: [facility];  Service: Endoscopy;;        Home Medications    Prior to Admission medications   Medication Sig Start Date End Date Taking? Authorizing Provider  acetaminophen (TYLENOL) 325 MG tablet Take 650 mg by mouth every 6 (six) hours as needed for moderate pain or headache.    [provider]  amLODipine (NORVASC) 10 MG tablet Take 5 mg by mouth daily.    [provider]  carvedilol (COREG) 25 MG tablet Take 25 mg by mouth 2 (two) times daily with a meal.    [provider]  cloNIDine (CATAPRES) 0.1 MG tablet Take 0.1 mg by mouth 3 (three) times daily.    [provider]  ENSURE (ENSURE) Take 0.5 Cans by mouth 3 (three) times daily between meals.    [provider]  gabapentin (NEURONTIN) 400 MG capsule Take 800 mg by mouth 3 (three) times daily.    [provider]  hydrochlorothiazide (HYDRODIURIL) 25 MG tablet Take 25 mg by mouth daily.    [provider]  ibuprofen (ADVIL,MOTRIN) 200 MG tablet Take 600 mg by mouth every 6 (six) hours as needed for moderate  pain.     [provider]  ketoconazole (NIZORAL) 2 % cream Apply 1 application topically daily as needed for irritation.    [provider]  lisinopril (PRINIVIL,ZESTRIL) 40 MG tablet Take 40 mg by mouth 2 (two) times daily.     [provider]  meloxicam (MOBIC) 15 MG tablet Take 15 mg by mouth daily.    [provider]  methocarbamol (ROBAXIN) 750 MG tablet Take 750 mg by mouth 3 (three) times daily as needed for muscle spasms.     [provider]  omeprazole (PRILOSEC) 20 MG capsule Take 40 mg by mouth  daily.     [provider]  oxycodone (OXY-IR) 5 MG capsule Take 5 mg by mouth 3 (three) times daily as needed for pain.    [provider]  Potassium Chloride ER 20 MEQ TBCR Take 20 mEq by mouth daily. 06/21/17   Gatha Mayer, MD  tamsulosin (FLOMAX) 0.4 MG CAPS capsule Take 0.4 mg by mouth daily.    [provider]    Family History Family History  Problem Relation Age of Onset  . Heart disease Mother   . Heart disease Father     Social History Social History   Tobacco Use  . Smoking status: Current Every Day Smoker    Packs/day: 0.30    Types: Cigarettes  . Smokeless tobacco: Never Used  Substance Use Topics  . Alcohol use: Not Currently  . Drug use: Not Currently     Allergies   Patient has no known allergies.   Review of Systems Review of Systems  All other systems reviewed and are negative.    Physical Exam Updated Vital Signs BP 135/70 (BP Location: Left Arm)   Pulse 77   Temp 99.5 F (37.5 C) (Oral)   Resp 12   Ht 5\' 9"  (1.753 m)   Wt 62.6 kg (138 lb)   SpO2 99%   BMI 20.38 kg/m   Physical Exam  Nursing note and vitals reviewed.  72 year old male, resting comfortably and in no acute distress. Vital signs are normal. Oxygen saturation is 99%, which is normal. Head is normocephalic and atraumatic. PERRLA, EOMI. Oropharynx is clear. Neck is nontender and supple without adenopathy or JVD. Back is nontender and there is no CVA tenderness. Lungs are clear without rales, wheezes, or rhonchi. Chest is nontender. Heart has regular rate and rhythm without murmur. Abdomen is soft, with moderate to severe tenderness in the epigastrium and right upper quadrant.  There is no rebound tenderness but there is some voluntary guarding.  There are no masses or hepatosplenomegaly and peristalsis is hypoactive. Extremities have no cyanosis or edema, full range of motion is present. Skin is warm and dry without rash. Neurologic: Mental  status is normal, cranial nerves are intact, there are no motor or sensory deficits.  ED Treatments / Results  Labs (all labs ordered are listed, but only abnormal results are displayed) Labs Reviewed  BASIC METABOLIC PANEL  CBC WITH DIFFERENTIAL/PLATELET  TYPE AND SCREEN    EKG EKG Interpretation  Date/Time:  Sunday July 14 2017 04:14:13 EDT Ventricular Rate:  78 PR Interval:    QRS Duration: 103 QT Interval:  478 QTC Calculation: 545 R Axis:   -96 Text Interpretation:  Sinus rhythm Right superior axis Borderline repolarization abnormality Prolonged QT interval When compared with ECG of 04/27/2017, QT has lengthened Confirmed by Delora Fuel ([id number]) on 07/14/2017 4:36:59 AM   Radiology No results found.  Procedures Procedures  Medications Ordered in ED Medications  sodium chloride 0.9 % bolus 1,000 mL (has no administration in time range)  ondansetron (ZOFRAN) injection 4 mg (has no administration in time range)  morphine 4 MG/ML injection 4 mg (has no administration in time range)  potassium chloride 10 mEq in 100 mL IVPB (has no administration in time range)     Initial Impression / Assessment and Plan / ED Course  I have reviewed the triage vital signs and the nursing notes.  Pertinent labs & imaging results that were available during my care of the patient were reviewed by me and considered in my medical decision making (see chart for details).  Small bowel obstruction.  Old records are reviewed confirming cholecystectomy in April, and admission on June 26 for a bile duct leak.  CT scan obtained at [facility] showed small bowel obstruction with transition in the right upper quadrant where there is apparent herniation of loops of small bowel through an omental defect concerning for an internal hernia.  I have independently reviewed these images.  Labs there showed hemoglobin of 11.4 and potassium of 2.7 with sodium 130.  He did receive morphine  and ondansetron there with relief and states pain is coming back.  He is given additional morphine and ondansetron.  He is being given IV fluids as well as potassium.  Case is discussed with Dr. Redmond Pulling of general surgery service who states he will come to evaluate the patient in the ED.  Final Clinical Impressions(s) / ED Diagnoses   Final diagnoses:  Small bowel obstruction ([place])  Hypokalemia  Hyponatremia    ED Discharge Orders    None       Delora Fuel, MD 54/09/81 [phone]

## 2017-07-14 NOTE — ED Triage Notes (Addendum)
Pt transferred from [facility] d/t SBO and N/V/Hematemesis and sac like structure where gallbladder was located. Pt has hx cholecystectomy in 04/2017. Pt had biliary stent removal x2 weeks ago, Pt was informed that stent had broken off. Pt has had abd pain for x2 weeks, that has worsen over the past x2 days. Pt was given morphine x2 last dose at 0209. Pt K+ 2.7, WBC 16.7, per [facility]

## 2017-07-14 NOTE — Progress Notes (Signed)
Spoke with Dr. Georgette Dover about abd xray result, OK to use.

## 2017-07-15 DIAGNOSIS — K458 Other specified abdominal hernia without obstruction or gangrene: Secondary | ICD-10-CM

## 2017-07-15 LAB — MAGNESIUM: Magnesium: 1.4 mg/dL — ABNORMAL LOW (ref 1.7–2.4)

## 2017-07-15 LAB — CBC
HCT: 28.9 % — ABNORMAL LOW (ref 39.0–52.0)
HEMOGLOBIN: 9.2 g/dL — AB (ref 13.0–17.0)
MCH: 28.8 pg (ref 26.0–34.0)
MCHC: 31.8 g/dL (ref 30.0–36.0)
MCV: 90.6 fL (ref 78.0–100.0)
PLATELETS: 296 10*3/uL (ref 150–400)
RBC: 3.19 MIL/uL — AB (ref 4.22–5.81)
RDW: 13.4 % (ref 11.5–15.5)
WBC: 13.1 10*3/uL — ABNORMAL HIGH (ref 4.0–10.5)

## 2017-07-15 LAB — BASIC METABOLIC PANEL
ANION GAP: 5 (ref 5–15)
CALCIUM: 7.5 mg/dL — AB (ref 8.9–10.3)
CO2: 29 mmol/L (ref 22–32)
Chloride: 102 mmol/L (ref 98–111)
Creatinine, Ser: 0.56 mg/dL — ABNORMAL LOW (ref 0.61–1.24)
GFR calc Af Amer: >60 mL/min (ref >60)
GFR calc non Af Amer: >60 mL/min (ref >60)
GLUCOSE: 122 mg/dL — AB (ref 70–99)
Potassium: 2.7 mmol/L — CL (ref 3.5–5.1)
Sodium: 136 mmol/L (ref 135–145)

## 2017-07-15 MED ORDER — NICOTINE 14 MG/24HR TD PT24
14.0000 mg | MEDICATED_PATCH | Freq: Every day | TRANSDERMAL | Status: DC
  Administered 2017-07-15 – 2017-08-02 (×19): 14 mg via TRANSDERMAL
  Filled 2017-07-15 (×19): qty 1

## 2017-07-15 MED ORDER — POTASSIUM CHLORIDE CRYS ER 20 MEQ PO TBCR
20.0000 meq | EXTENDED_RELEASE_TABLET | Freq: Once | ORAL | Status: AC
  Administered 2017-07-15: 20 meq via ORAL
  Filled 2017-07-15: qty 1

## 2017-07-15 MED ORDER — KCL IN DEXTROSE-NACL 20-5-0.45 MEQ/L-%-% IV SOLN: INTRAVENOUS | Status: DC

## 2017-07-15 MED ORDER — MAGNESIUM SULFATE 50 % IJ SOLN
3.0000 g | Freq: Once | INTRAVENOUS | Status: AC
  Administered 2017-07-15: 3 g via INTRAVENOUS
  Filled 2017-07-15 (×2): qty 6

## 2017-07-15 MED ORDER — KCL IN DEXTROSE-NACL 40-5-0.9 MEQ/L-%-% IV SOLN
INTRAVENOUS | Status: DC
  Administered 2017-07-15 – 2017-07-17 (×4): via INTRAVENOUS
  Filled 2017-07-15 (×4): qty 1000

## 2017-07-15 MED ORDER — POTASSIUM CHLORIDE 10 MEQ/100ML IV SOLN
10.0000 meq | INTRAVENOUS | Status: AC
  Administered 2017-07-15 (×4): 10 meq via INTRAVENOUS
  Filled 2017-07-15 (×4): qty 100

## 2017-07-15 MED ORDER — PANTOPRAZOLE SODIUM 40 MG PO TBEC
40.0000 mg | DELAYED_RELEASE_TABLET | Freq: Every day | ORAL | Status: DC
  Administered 2017-07-15 – 2017-07-18 (×4): 40 mg via ORAL
  Filled 2017-07-15 (×4): qty 1

## 2017-07-15 NOTE — Progress Notes (Signed)
Received call from lab, critical lab result potassium-2.7. Text page on call Dr. Georgette Dover and received new orders.

## 2017-07-15 NOTE — Progress Notes (Addendum)
Daily Rounding Note  07/15/2017, 8:20 AM  LOS: 1 day   SUBJECTIVE:   Chief complaint:all sxs: abd pain, nausea are all improved.  No nausea, slightly hungry/thirsty.  BM yesterday.  No emesis.  Temp to 101.4 overnight.     OBJECTIVE:         Vital signs in last 24 hours:    Temp:  [98.5 F (36.9 C)-101.4 F (38.6 C)] 98.5 F (36.9 C) (07/15 0531) Pulse Rate:  [73-88] 73 (07/15 0531) Resp:  [10-18] 17 (07/15 0140) BP: (123-155)/(67-105) 123/67 (07/15 0531) SpO2:  [95 %-100 %] 97 % (07/15 0531) Last BM Date: 07/14/17 Filed Weights   07/14/17 0416  Weight: 138 lb (62.6 kg)   General: frail, alert, comfortable.  Looks mildly ill   Heart: RRR Chest: clear bil.  Loose cough (not new) Abdomen: soft, ND, active BS, minor RUQ tenderness.    Extremities: sllight,  Non-pitting edema in feet/ankles.   Neuro/Psych:  Oriented x 3.  Psycho-motor slowing.  Moves all 4s, no obvious deficits.    Intake/Output from previous day: 07/14 0701 - 07/15 0700 In: 1936.9 [I.V.:1622.1; IV Piggyback:314.8] Out: 250 [Urine:250]  Intake/Output this shift: No intake/output data recorded.  Lab Results: Recent Labs    07/14/17 0503 07/15/17 0448  WBC 20.8* 13.1*  HGB 10.7* 9.2*  HCT 32.4* 28.9*  PLT 345 296   BMET Recent Labs    07/14/17 0503 07/14/17 0943 07/15/17 0448  NA 134* 134* 136  K 2.5* 3.1* 2.7*  CL 96* 100 102  CO2 27 26 29   GLUCOSE 129* 107* 122*  BUN 7* 6* <5*  CREATININE 0.63 0.52* 0.56*  CALCIUM 7.9* 7.4* 7.5*   LFT Recent Labs    07/14/17 0943  PROT 5.7*  ALBUMIN 1.9*  AST 43*  ALT 30  ALKPHOS 81  BILITOT 0.9  BILIDIR 0.3*  IBILI 0.6   PT/INR No results for input(s): LABPROT, INR in the last 72 hours. Hepatitis Panel No results for input(s): HEPBSAG, HCVAB, HEPAIGM, HEPBIGM in the last 72 hours.  Studies/Results: Dg Abd Portable 1v-small Bowel Obstruction Protocol-initial, 8 Hr  Delay  Result Date: 07/14/2017 CLINICAL DATA:  Small-bowel obstruction 8 hour film. EXAM: PORTABLE ABDOMEN - 1 VIEW COMPARISON:  Earlier same day. FINDINGS: Nasogastric tube is no longer present. Contrast is present within the stomach, throughout the small intestine, in probably reaching the right colon. Small bowel does appear mildly prominent. IMPRESSION: Contrast reaches at least the right colon. Slightly prominent small intestinal pattern. Nasogastric tube no longer present. Electronically Signed   By: Nelson Chimes M.D.   On: 07/14/2017 21:49   Dg Abd Portable 1 View  Result Date: 07/14/2017 CLINICAL DATA:  NG tube placement Patient pulled out 1st one EXAM: PORTABLE ABDOMEN - 1 VIEW COMPARISON:  07/14/2017 FINDINGS: Nasogastric tube has been placed, tip overlying the level of the mid stomach. Bowel gas pattern is nonobstructive. There is dense opacity in the RIGHT UPPER lobe of the chest. IMPRESSION: 1. Nasogastric tube tip overlying the stomach. 2. Suspect RIGHT UPPER lobe consolidation or significant atelectasis. Recommend further evaluation with two-view chest x-ray if possible. These results will be called to the ordering clinician or representative by the Radiologist Assistant, and communication documented in the PACS or zVision Dashboard. Electronically Signed   By: Nolon Nations M.D.   On: 07/14/2017 11:54   Dg Abd Portable 1 View  Result Date: 07/14/2017 CLINICAL DATA:  NG placement EXAM: PORTABLE  ABDOMEN - 1 VIEW COMPARISON:  CT abdomen pelvis 07/14/2017 FINDINGS: NG has been placed with the tip in the body of the stomach. Nondilated large and small bowel loops are present. IMPRESSION: NG tube in the stomach. Electronically Signed   By: Franchot Gallo M.D.   On: 07/14/2017 09:40   Scheduled Meds: . amLODipine  5 mg Oral Daily  . carvedilol  25 mg Oral BID WC  . cloNIDine  0.1 mg Oral TID  . enoxaparin (LOVENOX) injection  30 mg Subcutaneous Q24H  . gabapentin  800 mg Oral TID  .  hydrochlorothiazide  25 mg Oral Daily  . lisinopril  40 mg Oral BID  . tamsulosin  0.4 mg Oral Daily   Continuous Infusions: . dextrose 5 % and 0.45 % NaCl with KCl 20 mEq/L 100 mL/hr at 07/15/17 0805  . famotidine (PEPCID) IV Stopped (07/14/17 2146)  . piperacillin-tazobactam (ZOSYN)  IV 3.375 g (07/14/17 2348)  . potassium chloride 10 mEq (07/15/17 0806)   PRN Meds:.acetaminophen, morphine injection, ondansetron **OR** ondansetron (ZOFRAN) IV   ASSESMENT:   *   CGE in setting PSBO.  IV pepcid in place.  Pt removed NGT x 2, left out currently.  BGP improved per serial xrays through 7/14.   Clinically and xray improvement.    *  Subtotal cholecystectomy 04/2017.  S/p ERCP/stent placement for post op bile leak. Stent removed 06/26/17 and no evidence bile leak at cholnagiogram.  Now with sac-like structure on [place] CT.  Surgeon Dr Redmond Pulling feels this is remnant portion of GB and has low concern for biloma/bile leak. Contained abscess also in DDx.  Zosyn in place.  WBCs improved but temps to 101.4 overnight.  .       *   Hypokalemia. 4 runs IV KCl ordered.      *  RUL consolidation.    *    Normocytic anemia.     PLAN   *   Allow clears, no cola/dark or red liquids.    *  Switch pepcid to oral Protonix.  Takes omeprazole 40 mg daily PTA.      [name]  07/15/2017, 8:20 AM Phone [phone]

## 2017-07-15 NOTE — Progress Notes (Signed)
CC: Abdominal pain/SBO  Subjective: Patient feels better this a.m.  He underwent small bowel protocol and is contrast in the right colon.  GI started him on some clears this a.m.  Decreased abdominal distention and discomfort.  He had a bowel movement last evening and this morning.  Objective: Vital signs in last 24 hours: Temp:  [98.5 F (36.9 C)-101.4 F (38.6 C)] 98.5 F (36.9 C) (07/15 0531) Pulse Rate:  [73-84] 73 (07/15 0531) Resp:  [17] 17 (07/15 0140) BP: (123-153)/(67-84) 123/67 (07/15 0531) SpO2:  [95 %-98 %] 97 % (07/15 0531) Last BM Date: 07/14/17 N.p.o. 1936 IV Voided x2 -250 volume recorded BM x1 T-max 101.4 last evening 2300; 98.5 this a.m. Other vital signs are stable K+ 2.7, magnesium 1.4 WBC 13.1, down from 20.8 on admission Small bowel protocol: Films last p.m. at 2149: Contrast reaches right: Slight prominent small intestinal pattern, NG is out. ERCP 6/26:  Initial image demonstrates an ERCP probe overlying the right upper abdominal quadrant. There is selective cannulation opacification the common bile duct. Subsequent images demonstrate insufflation of a balloon within distal aspect of the CBD with subsequent presumed biliary sweeping and sphincterotomy. There was minimal opacification intrahepatic biliary tree which appears nondilated. There is minimal opacification of the cystic duct and neck of the gallbladder.   Intake/Output from previous day: 07/14 0701 - 07/15 0700 In: 1936.9 [I.V.:1622.1; IV Piggyback:314.8] Out: 250 [Urine:250] Intake/Output this shift: No intake/output data recorded.  General appearance: alert, cooperative and no distress Resp: clear to auscultation bilaterally GI: Soft, minimally distended few bowel sounds, BM x2 since admission  Lab Results:  Recent Labs    07/14/17 0503 07/15/17 0448  WBC 20.8* 13.1*  HGB 10.7* 9.2*  HCT 32.4* 28.9*  PLT 345 296    BMET Recent Labs    07/14/17 0943 07/15/17 0448   NA 134* 136  K 3.1* 2.7*  CL 100 102  CO2 26 29  GLUCOSE 107* 122*  BUN 6* <5*  CREATININE 0.52* 0.56*  CALCIUM 7.4* 7.5*   PT/INR No results for input(s): LABPROT, INR in the last 72 hours.  Recent Labs  Lab 07/14/17 0943  AST 43*  ALT 30  ALKPHOS 81  BILITOT 0.9  PROT 5.7*  ALBUMIN 1.9*     Lipase     Component Value Date/Time   LIPASE 21 07/14/2017 0943     Prior to Admission medications   Medication Sig Start Date End Date Taking? Authorizing Provider  acetaminophen (TYLENOL) 325 MG tablet Take 650 mg by mouth every 6 (six) hours as needed for moderate pain or headache.   Yes [provider]  amLODipine (NORVASC) 10 MG tablet Take 5 mg by mouth daily.   Yes [provider]  carvedilol (COREG) 25 MG tablet Take 25 mg by mouth 2 (two) times daily with a meal.   Yes [provider]  cloNIDine (CATAPRES) 0.1 MG tablet Take 0.1 mg by mouth 3 (three) times daily.   Yes [provider]  ENSURE (ENSURE) Take 0.5 Cans by mouth 3 (three) times daily between meals.   Yes [provider]  gabapentin (NEURONTIN) 400 MG capsule Take 800 mg by mouth 3 (three) times daily.   Yes [provider]  hydrochlorothiazide (HYDRODIURIL) 25 MG tablet Take 25 mg by mouth daily.   Yes [provider]  ibuprofen (ADVIL,MOTRIN) 200 MG tablet Take 600 mg by mouth every 6 (six) hours as needed for moderate pain.    Yes  [provider]  ketoconazole (NIZORAL) 2 % cream Apply 1 application topically daily as needed for irritation.   Yes [provider]  lisinopril (PRINIVIL,ZESTRIL) 40 MG tablet Take 40 mg by mouth 2 (two) times daily.    Yes [provider]  meloxicam (MOBIC) 15 MG tablet Take 15 mg by mouth daily.   Yes [provider]  methocarbamol (ROBAXIN) 750 MG tablet Take 750 mg by mouth 3 (three) times daily as needed for muscle spasms.    Yes [provider]  omeprazole (PRILOSEC) 20  MG capsule Take 40 mg by mouth daily.    Yes [provider]  oxycodone (OXY-IR) 5 MG capsule Take 5 mg by mouth 3 (three) times daily as needed for pain.   Yes [provider]  Potassium Chloride ER 20 MEQ TBCR Take 20 mEq by mouth daily. 06/21/17  Yes Gatha Mayer, MD  tamsulosin (FLOMAX) 0.4 MG CAPS capsule Take 0.4 mg by mouth daily.   Yes [provider]    Medications: . amLODipine  5 mg Oral Daily  . carvedilol  25 mg Oral BID WC  . cloNIDine  0.1 mg Oral TID  . enoxaparin (LOVENOX) injection  30 mg Subcutaneous Q24H  . gabapentin  800 mg Oral TID  . hydrochlorothiazide  25 mg Oral Daily  . lisinopril  40 mg Oral BID  . nicotine  14 mg Transdermal Daily  . pantoprazole  40 mg Oral Q0600  . tamsulosin  0.4 mg Oral Daily   . dextrose 5 % and 0.45 % NaCl with KCl 20 mEq/L 100 mL/hr at 07/15/17 0805  . piperacillin-tazobactam (ZOSYN)  IV 3.375 g (07/14/17 2348)  . potassium chloride 10 mEq (07/15/17 1043)   Anti-infectives (From admission, onward)   Start     Dose/Rate Route Frequency Ordered Stop   07/14/17 0900  piperacillin-tazobactam (ZOSYN) IVPB 3.375 g     3.375 g 12.5 mL/hr over 240 Minutes Intravenous Every 8 hours 07/14/17 0818        Assessment/Plan Hypertension - severe on multiple agents Hypokalemia/hypomagnesemia -3 10 mEq -  IV runs yesterday; 4 runs started today. Magnesium ordered  Anemia Tobacco use - nicotine patch  Chronic pain  Small bowel obstruction S/P laparoscopic partial cholecystectomy 04/29/2017, Dr. Ralene Ok Bile leak with postop stent placement and stent removal, Dr. Silvano Rusk  Fen: IV fluids/n.p.o. =>> started on clears this a.m. by GI ID: [email or web address] 7/14 =>> day 2 DVT: Lovenox Follow-up: TBD  Plan: Replacing potassium and magnesium with oral and IV meds today.  He has been started on clear liquids.  Mobilize.       LOS: 1 day    Fara Worthy 07/15/2017 [phone]

## 2017-07-16 ENCOUNTER — Inpatient Hospital Stay (HOSPITAL_COMMUNITY): Payer: Medicare Other

## 2017-07-16 ENCOUNTER — Encounter (HOSPITAL_COMMUNITY): Payer: Self-pay | Admitting: General Practice

## 2017-07-16 LAB — BASIC METABOLIC PANEL
ANION GAP: 8 (ref 5–15)
BUN: <5 mg/dL — ABNORMAL LOW (ref 8–23)
CALCIUM: 8 mg/dL — AB (ref 8.9–10.3)
CHLORIDE: 100 mmol/L (ref 98–111)
CO2: 28 mmol/L (ref 22–32)
Creatinine, Ser: 0.53 mg/dL — ABNORMAL LOW (ref 0.61–1.24)
GFR calc Af Amer: >60 mL/min (ref >60)
GFR calc non Af Amer: >60 mL/min (ref >60)
GLUCOSE: 107 mg/dL — AB (ref 70–99)
Potassium: 3.3 mmol/L — ABNORMAL LOW (ref 3.5–5.1)
Sodium: 136 mmol/L (ref 135–145)

## 2017-07-16 LAB — CBC
HEMATOCRIT: 33.7 % — AB (ref 39.0–52.0)
Hemoglobin: 10.9 g/dL — ABNORMAL LOW (ref 13.0–17.0)
MCH: 28.5 pg (ref 26.0–34.0)
MCHC: 32.3 g/dL (ref 30.0–36.0)
MCV: 88 fL (ref 78.0–100.0)
Platelets: 369 10*3/uL (ref 150–400)
RBC: 3.83 MIL/uL — ABNORMAL LOW (ref 4.22–5.81)
RDW: 13.5 % (ref 11.5–15.5)
WBC: 12.2 10*3/uL — AB (ref 4.0–10.5)

## 2017-07-16 LAB — MAGNESIUM: Magnesium: 1.8 mg/dL (ref 1.7–2.4)

## 2017-07-16 MED ORDER — OXYCODONE HCL 5 MG PO TABS
5.0000 mg | ORAL_TABLET | ORAL | Status: DC | PRN
  Administered 2017-07-16 – 2017-07-18 (×8): 5 mg via ORAL
  Filled 2017-07-16: qty 1
  Filled 2017-07-16: qty 2
  Filled 2017-07-16 (×6): qty 1

## 2017-07-16 MED ORDER — MORPHINE SULFATE (PF) 4 MG/ML IV SOLN
1.0000 mg | Freq: Four times a day (QID) | INTRAVENOUS | Status: DC | PRN
  Administered 2017-07-17: 1 mg via INTRAVENOUS
  Filled 2017-07-16: qty 1

## 2017-07-16 MED ORDER — ACETAMINOPHEN 325 MG PO TABS
650.0000 mg | ORAL_TABLET | Freq: Four times a day (QID) | ORAL | Status: DC | PRN
  Administered 2017-07-17 (×2): 650 mg via ORAL
  Filled 2017-07-16 (×2): qty 2

## 2017-07-16 MED ORDER — POTASSIUM CHLORIDE CRYS ER 20 MEQ PO TBCR
40.0000 meq | EXTENDED_RELEASE_TABLET | Freq: Two times a day (BID) | ORAL | Status: AC
  Administered 2017-07-16 (×2): 40 meq via ORAL
  Filled 2017-07-16 (×2): qty 2

## 2017-07-16 NOTE — Progress Notes (Signed)
Daily Rounding Note  07/16/2017, 8:21 AM  LOS: 2 days   SUBJECTIVE:   Chief complaint: malaise, weakness.  Watery brown stool x 1 yest and 1 this AM.  Received oral and IV potassium yesterday.  No nausea.        OBJECTIVE:         Vital signs in last 24 hours:    Temp:  [97.7 F (36.5 C)-98.6 F (37 C)] 98.1 F (36.7 C) (07/16 0536) Pulse Rate:  [72-95] 95 (07/16 0748) Resp:  [15-20] 16 (07/16 0536) BP: (132-161)/(68-98) 159/98 (07/16 0748) SpO2:  [98 %-100 %] 99 % (07/16 0748) Last BM Date: 07/15/17 Filed Weights   07/14/17 0416  Weight: 138 lb (62.6 kg)   General: cooperative, comfortable.  Looks frail and undernourished.     Heart: RRR Chest: clear bil.  No cough or dyspnea Abdomen: soft, active BS.  ND.  Slight tenderness RUQ  Extremities: no CCE Neuro/Psych:  Alert, oriented x 3.  No tremor or gross weakness.     Intake/Output from previous day: 07/15 0701 - 07/16 0700 In: 4987.2 [P.O.:900; I.V.:3233.8; IV Piggyback:853.3] Out: 2475 [Urine:2475]  Intake/Output this shift: Total I/O In: -  Out: 200 [Urine:200]  Lab Results: Recent Labs    07/14/17 0503 07/15/17 0448 07/16/17 0520  WBC 20.8* 13.1* 12.2*  HGB 10.7* 9.2* 10.9*  HCT 32.4* 28.9* 33.7*  PLT 345 296 369   BMET Recent Labs    07/14/17 0943 07/15/17 0448 07/16/17 0520  NA 134* 136 136  K 3.1* 2.7* 3.3*  CL 100 102 100  CO2 26 29 28   GLUCOSE 107* 122* 107*  BUN 6* <5* <5*  CREATININE 0.52* 0.56* 0.53*  CALCIUM 7.4* 7.5* 8.0*   LFT Recent Labs    07/14/17 0943  PROT 5.7*  ALBUMIN 1.9*  AST 43*  ALT 30  ALKPHOS 81  BILITOT 0.9  BILIDIR 0.3*  IBILI 0.6    Studies/Results: Dg Abd Portable 1v-small Bowel Obstruction Protocol-initial, 8 Hr Delay  Result Date: 07/14/2017 CLINICAL DATA:  Small-bowel obstruction 8 hour film. EXAM: PORTABLE ABDOMEN - 1 VIEW COMPARISON:  Earlier same day. FINDINGS: Nasogastric tube is no  longer present. Contrast is present within the stomach, throughout the small intestine, in probably reaching the right colon. Small bowel does appear mildly prominent. IMPRESSION: Contrast reaches at least the right colon. Slightly prominent small intestinal pattern. Nasogastric tube no longer present. Electronically Signed   By: Nelson Chimes M.D.   On: 07/14/2017 21:49   Dg Abd Portable 1 View  Result Date: 07/14/2017 CLINICAL DATA:  NG tube placement Patient pulled out 1st one EXAM: PORTABLE ABDOMEN - 1 VIEW COMPARISON:  07/14/2017 FINDINGS: Nasogastric tube has been placed, tip overlying the level of the mid stomach. Bowel gas pattern is nonobstructive. There is dense opacity in the RIGHT UPPER lobe of the chest. IMPRESSION: 1. Nasogastric tube tip overlying the stomach. 2. Suspect RIGHT UPPER lobe consolidation or significant atelectasis. Recommend further evaluation with two-view chest x-ray if possible. These results will be called to the ordering clinician or representative by the Radiologist Assistant, and communication documented in the PACS or zVision Dashboard. Electronically Signed   By: Nolon Nations M.D.   On: 07/14/2017 11:54   Dg Abd Portable 1 View  Result Date: 07/14/2017 CLINICAL DATA:  NG placement EXAM: PORTABLE ABDOMEN - 1 VIEW COMPARISON:  CT abdomen pelvis 07/14/2017 FINDINGS: NG has been placed with the tip in  the body of the stomach. Nondilated large and small bowel loops are present. IMPRESSION: NG tube in the stomach. Electronically Signed   By: Franchot Gallo M.D.   On: 07/14/2017 09:40    Scheduled Meds: . amLODipine  5 mg Oral Daily  . carvedilol  25 mg Oral BID WC  . cloNIDine  0.1 mg Oral TID  . enoxaparin (LOVENOX) injection  30 mg Subcutaneous Q24H  . gabapentin  800 mg Oral TID  . hydrochlorothiazide  25 mg Oral Daily  . lisinopril  40 mg Oral BID  . nicotine  14 mg Transdermal Daily  . pantoprazole  40 mg Oral Q0600  . tamsulosin  0.4 mg Oral Daily    Continuous Infusions: . dextrose 5 % and 0.9 % NaCl with KCl 40 mEq/L 100 mL/hr at 07/16/17 0657  . piperacillin-tazobactam (ZOSYN)  IV Stopped (07/16/17 0444)   PRN Meds:.acetaminophen, morphine injection, ondansetron **OR** ondansetron (ZOFRAN) IV   ASSESMENT:   *   CGE in setting PSBO.  BGP improved per serial xrays through 7/14.  Nausea resolved. Tolerating clears.  Po Protonix in place.     *  Subtotal cholecystectomy 04/2017.  S/p ERCP/stent placement for post op bile leak. Stent removed 06/26/17 and no evidence bile leak at cholnagiogram.  Now with sac-like structure on [place] CT likely represents remnant portion of GB.  Empiric Zosyn in place in case of Biloma and for pulm indication.  No fevers >24 hours; WBCs improved, not yet normal.   *   Hypokalemia. Improved post IV and oral KCl but persists.   *  Watery but not frequent stools.  ? S/e of Zosyn or secondary to potassium po?.  Stools not frequent, so less likely C diff.    *  RUL consolidation.  Day 3 Zosyn.    *    Normocytic anemia.   *   Hypertension.  Continues on multiple meds per home routine.      PLAN   *   Advance to vegetarian diet.      [name]  07/16/2017, 8:21 AM Phone [phone]

## 2017-07-16 NOTE — Progress Notes (Signed)
Will Creig Hines, [place] notified of chest X-ray results.

## 2017-07-16 NOTE — Evaluation (Signed)
Physical Therapy Evaluation Patient Details Name: Gerald Farrell MRN: [id number] DOB: 12-May-1945 Today's Date: 07/16/2017   History of Present Illness  [name] with history of hypertension, tobacco use and recent melanoma removal status post laparoscopic partial cholecystectomy April 29, 2017 for gangrenous cholecystitis was transferred from [facility] for abdominal pain, small bowel obstruction and hematemesis.  Clinical Impression  PTA pt lives with son in single story home, independent with limited community ambulation with cane and independent with all ADLs. Pt exhibits mild generalized weakness and is currently mod I for bed mobility, and transfers and supervision for ambulation of 400 feet with RW. Pt requires min guard assist for ambulation without AD. PT educated on need for use of RW for more stability with longer distance ambulation once he returns home. PT recommend working with mobility tech and PT acutely, but does not see need for additional PT after d/c.     Follow Up Recommendations No PT follow up    Equipment Recommendations  None recommended by PT    Recommendations for Other Services       Precautions / Restrictions Restrictions Weight Bearing Restrictions: No      Mobility  Bed Mobility Overal bed mobility: Modified Independent                Transfers Overall transfer level: Modified independent Equipment used: None             General transfer comment: good power up and steadying in standing  Ambulation/Gait Ambulation/Gait assistance: Supervision;Min guard Gait Distance (Feet): 440 Feet Assistive device: Rolling walker (2 wheeled) Gait Pattern/deviations: Step-through pattern Gait velocity: slowed Gait velocity interpretation: >2.62 ft/sec, indicative of community ambulatory General Gait Details: supervision for ambulation with RW, steady slow, gait with no overt abnomalities, ambulated 40 feet with  min guard A, mild instability, and slight drift L/R      Balance Overall balance assessment: Mild deficits observed, not formally tested                                           Pertinent Vitals/Pain Pain Assessment: 0-10 Pain Score: 6  Pain Location: Low back radiating to R hip Pain Descriptors / Indicators: Radiating    Home Living Family/patient expects to be discharged to:: Private residence Living Arrangements: Children Available Help at Discharge: Available 24 hours/day;Family Type of Home: House Home Access: Stairs to enter Entrance Stairs-Rails: None Entrance Stairs-Number of Steps: 2 Home Layout: One level Home Equipment: Tub bench;Hand held shower head;Cane - single point;Walker - 2 wheels      Prior Function Level of Independence: Independent with assistive device(s)         Comments: limited community ambulation, son d     Hand Dominance   Dominant Hand: Right    Extremity/Trunk Assessment   Upper Extremity Assessment Upper Extremity Assessment: Generalized weakness    Lower Extremity Assessment Lower Extremity Assessment: Generalized weakness       Communication   Communication: No difficulties  Cognition Arousal/Alertness: Awake/alert Behavior During Therapy: WFL for tasks assessed/performed Overall Cognitive Status: Within Functional Limits for tasks assessed                                        General Comments General comments (skin  integrity, edema, etc.): VSS    Exercises     Assessment/Plan    PT Assessment Patient needs continued PT services  PT Problem List Pain;Decreased mobility;Decreased activity tolerance       PT Treatment Interventions DME instruction;Gait training;Stair training;Functional mobility training;Therapeutic activities;Therapeutic exercise;Balance training;Patient/family education    PT Goals (Current goals can be found in the Care Plan section)  Acute Rehab PT  Goals Patient Stated Goal: feel better and get back to yoga PT Goal Formulation: With patient Time For Goal Achievement: 07/30/17 Potential to Achieve Goals: Fair    Frequency Min 3X/week    AM-PAC PT "6 Clicks" Daily Activity  Outcome Measure Difficulty turning over in bed (including adjusting bedclothes, sheets and blankets)?: None Difficulty moving from lying on back to sitting on the side of the bed? : None Difficulty sitting down on and standing up from a chair with arms (e.g., wheelchair, bedside commode, etc,.)?: None Help needed moving to and from a bed to chair (including a wheelchair)?: None Help needed walking in hospital room?: None Help needed climbing 3-5 steps with a railing? : A Little 6 Click Score: 23    End of Session Equipment Utilized During Treatment: Gait belt Activity Tolerance: Patient tolerated treatment well Patient left: in bed;with call bell/phone within reach;with SCD's reapplied Nurse Communication: Mobility status PT Visit Diagnosis: Other abnormalities of gait and mobility (R26.89);Muscle weakness (generalized) (M62.81)    Time: 1194-1740 PT Time Calculation (min) (ACUTE ONLY): 30 min   Charges:   PT Evaluation $PT Eval Moderate Complexity: 1 Mod PT Treatments $Gait Training: 8-22 mins   PT G Codes:        Nithya Meriweather B. Migdalia Dk PT, DPT Acute Rehabilitation  [phone] Pager [phone]    [place] 07/16/2017, 3:37 PM

## 2017-07-16 NOTE — Progress Notes (Signed)
[facility] Progress Note     Subjective: CC-  Sitting up in bed. Overall feeling better but does still have some intermittent abdominal discomfort. Bloating improving. Tolerating clears, denies n/v. Passing some flatus and he has had a couple of BMs, both loose.  Lives at home with his son.  Objective: Vital signs in last 24 hours: Temp:  [98.1 F (36.7 C)-98.6 F (37 C)] 98.1 F (36.7 C) (07/16 0536) Pulse Rate:  [72-95] 95 (07/16 0748) Resp:  [15-20] 16 (07/16 0536) BP: (132-161)/(68-98) 159/98 (07/16 0748) SpO2:  [98 %-100 %] 99 % (07/16 0748) Last BM Date: 07/16/17  Intake/Output from previous day: 07/15 0701 - 07/16 0700 In: 4987.2 [P.O.:900; I.V.:3233.8; IV Piggyback:853.3] Out: 2475 [Urine:2475] Intake/Output this shift: Total I/O In: 0  Out: 200 [Urine:200]  PE: Gen:  Alert, NAD, pleasant HEENT: EOM's intact, pupils equal and round Card:  RRR, no M/G/R heard Pulm:  CTAB, no W/R/R, effort normal Abd: Soft, mild distension, +BS in all 4 quadrants, subjective central abdominal TTP Psych: A&Ox3  Skin: no rashes noted, warm and dry  Lab Results:  Recent Labs    07/15/17 0448 07/16/17 0520  WBC 13.1* 12.2*  HGB 9.2* 10.9*  HCT 28.9* 33.7*  PLT 296 369   BMET Recent Labs    07/15/17 0448 07/16/17 0520  NA 136 136  K 2.7* 3.3*  CL 102 100  CO2 29 28  GLUCOSE 122* 107*  BUN <5* <5*  CREATININE 0.56* 0.53*  CALCIUM 7.5* 8.0*   PT/INR No results for input(s): LABPROT, INR in the last 72 hours. CMP     Component Value Date/Time   NA 136 07/16/2017 0520   K 3.3 (L) 07/16/2017 0520   CL 100 07/16/2017 0520   CO2 28 07/16/2017 0520   GLUCOSE 107 (H) 07/16/2017 0520   BUN <5 (L) 07/16/2017 0520   CREATININE 0.53 (L) 07/16/2017 0520   CALCIUM 8.0 (L) 07/16/2017 0520   PROT 5.7 (L) 07/14/2017 0943   ALBUMIN 1.9 (L) 07/14/2017 0943   AST 43 (H) 07/14/2017 0943   ALT 30 07/14/2017 0943   ALKPHOS 81 07/14/2017 0943   BILITOT 0.9  07/14/2017 0943   GFRNONAA >60 07/16/2017 0520   GFRAA >60 07/16/2017 0520   Lipase     Component Value Date/Time   LIPASE 21 07/14/2017 0943       Studies/Results: Dg Abd Portable 1v-small Bowel Obstruction Protocol-initial, 8 Hr Delay  Result Date: 07/14/2017 CLINICAL DATA:  Small-bowel obstruction 8 hour film. EXAM: PORTABLE ABDOMEN - 1 VIEW COMPARISON:  Earlier same day. FINDINGS: Nasogastric tube is no longer present. Contrast is present within the stomach, throughout the small intestine, in probably reaching the right colon. Small bowel does appear mildly prominent. IMPRESSION: Contrast reaches at least the right colon. Slightly prominent small intestinal pattern. Nasogastric tube no longer present. Electronically Signed   By: Nelson Chimes M.D.   On: 07/14/2017 21:49   Dg Abd Portable 1 View  Result Date: 07/14/2017 CLINICAL DATA:  NG tube placement Patient pulled out 1st one EXAM: PORTABLE ABDOMEN - 1 VIEW COMPARISON:  07/14/2017 FINDINGS: Nasogastric tube has been placed, tip overlying the level of the mid stomach. Bowel gas pattern is nonobstructive. There is dense opacity in the RIGHT UPPER lobe of the chest. IMPRESSION: 1. Nasogastric tube tip overlying the stomach. 2. Suspect RIGHT UPPER lobe consolidation or significant atelectasis. Recommend further evaluation with two-view chest x-ray if possible. These results will be called to the ordering  clinician or representative by the Radiologist Assistant, and communication documented in the PACS or zVision Dashboard. Electronically Signed   By: Nolon Nations M.D.   On: 07/14/2017 11:54    Anti-infectives: Anti-infectives (From admission, onward)   Start     Dose/Rate Route Frequency Ordered Stop   07/14/17 0900  piperacillin-tazobactam (ZOSYN) IVPB 3.375 g     3.375 g 12.5 mL/hr over 240 Minutes Intravenous Every 8 hours 07/14/17 0818         Assessment/Plan Hypertension - severe on multiple agents Hypokalemia - 3.3  today, will replace with oral K Hypomagnesemia - Mg 1.8, resolved Chronic anemia - Hg 10.9 from 9.2, stable Tobacco use - nicotine patch  Chronic pain  Small bowel obstruction - XR 7/14 contrast in right colon - tolerating liquids and having bowel function S/P laparoscopic partial cholecystectomy 04/29/2017, Dr. Ralene Ok Bile leak with postop stent placement and stent removal, Dr. Silvano Rusk  Fen: IV fluids/vegetarian diet ID: Gerald Farrell 7/14 =>> day 3 DVT: Lovenox Follow-up: TBD  Plan: Advanced to vegetarian diet by GI this AM. replace potassium as above. Encourage patient to continue mobilizing, will consult PT. Labs in AM.   LOS: 2 days    Gerald Farrell , [facility] Surgery 07/16/2017, 10:54 AM Pager: [phone] Consults: [phone] Mon 7:00 am -11:30 AM Tues-Fri 7:00 am-4:30 pm Sat-Sun 7:00 am-11:30 am

## 2017-07-17 ENCOUNTER — Inpatient Hospital Stay (HOSPITAL_COMMUNITY): Payer: Medicare Other

## 2017-07-17 DIAGNOSIS — J181 Lobar pneumonia, unspecified organism: Secondary | ICD-10-CM

## 2017-07-17 DIAGNOSIS — R937 Abnormal findings on diagnostic imaging of other parts of musculoskeletal system: Secondary | ICD-10-CM

## 2017-07-17 LAB — CBC
HEMATOCRIT: 32.8 % — AB (ref 39.0–52.0)
Hemoglobin: 10.6 g/dL — ABNORMAL LOW (ref 13.0–17.0)
MCH: 28.6 pg (ref 26.0–34.0)
MCHC: 32.3 g/dL (ref 30.0–36.0)
MCV: 88.4 fL (ref 78.0–100.0)
PLATELETS: 388 10*3/uL (ref 150–400)
RBC: 3.71 MIL/uL — ABNORMAL LOW (ref 4.22–5.81)
RDW: 13.9 % (ref 11.5–15.5)
WBC: 9.1 10*3/uL (ref 4.0–10.5)

## 2017-07-17 LAB — BASIC METABOLIC PANEL
ANION GAP: 6 (ref 5–15)
BUN: <5 mg/dL — ABNORMAL LOW (ref 8–23)
CALCIUM: 8.5 mg/dL — AB (ref 8.9–10.3)
CO2: 27 mmol/L (ref 22–32)
Chloride: 103 mmol/L (ref 98–111)
Creatinine, Ser: 0.5 mg/dL — ABNORMAL LOW (ref 0.61–1.24)
GFR calc non Af Amer: >60 mL/min (ref >60)
Glucose, Bld: 102 mg/dL — ABNORMAL HIGH (ref 70–99)
POTASSIUM: 4 mmol/L (ref 3.5–5.1)
SODIUM: 136 mmol/L (ref 135–145)

## 2017-07-17 LAB — SURGICAL PCR SCREEN
MRSA, PCR: NEGATIVE
Staphylococcus aureus: NEGATIVE

## 2017-07-17 MED ORDER — IOPAMIDOL (ISOVUE-300) INJECTION 61%
INTRAVENOUS | Status: AC
  Filled 2017-07-17: qty 30

## 2017-07-17 MED ORDER — KCL IN DEXTROSE-NACL 40-5-0.9 MEQ/L-%-% IV SOLN: INTRAVENOUS | Status: DC

## 2017-07-17 MED ORDER — ZOLPIDEM TARTRATE 5 MG PO TABS
5.0000 mg | ORAL_TABLET | Freq: Every evening | ORAL | Status: DC | PRN
  Administered 2017-07-17: 5 mg via ORAL
  Filled 2017-07-17: qty 1

## 2017-07-17 MED ORDER — IOHEXOL 300 MG/ML  SOLN
100.0000 mL | Freq: Once | INTRAMUSCULAR | Status: AC
  Administered 2017-07-17: 100 mL via INTRAVENOUS

## 2017-07-17 MED ORDER — KCL IN DEXTROSE-NACL 20-5-0.9 MEQ/L-%-% IV SOLN
INTRAVENOUS | Status: DC
  Administered 2017-07-17 – 2017-07-18 (×2): via INTRAVENOUS
  Filled 2017-07-17 (×3): qty 1000

## 2017-07-17 NOTE — Progress Notes (Signed)
CC: Abdominal pain/SBO  Subjective: Still having some intermittent pain and discomfort.  Seems to be doing okay with the vegetarian diet.  He did have bowel movements.  He is not overly distended.  Objective: Vital signs in last 24 hours: Temp:  [97.7 F (36.5 C)-98 F (36.7 C)] 97.7 F (36.5 C) (07/17 0515) Pulse Rate:  [72-79] 72 (07/17 0515) Resp:  [14-17] 16 (07/17 0515) BP: (143-162)/(87-94) 145/89 (07/17 0515) SpO2:  [99 %-100 %] 100 % (07/17 0515) Last BM Date: 07/16/17 594 p.o. 2 L IV 2800 urine BM x2 Afebrile blood pressure slightly elevated but vital signs are stable. Labs shows potassium is up to 4.0.  Magnesium was 1.8 yesterday.   WBC is 9.1 this a.m. Chest x-ray ordered last p.m. showed a masslike consolidation involving the right hilum extending the right upper lobe.  Linear atelectasis the right upper lobe and in the right middle lobe.  There is a small right pleural effusion, ongoing since partial small bowel obstruction noted on prior abdominal exams.  Intake/Output from previous day: 07/16 0701 - 07/17 0700 In: 2553.6 [P.O.:594; I.V.:1809.7; IV Piggyback:149.9] Out: 2800 [Urine:2800] Intake/Output this shift: No intake/output data recorded.  General appearance: alert, cooperative and no distress Resp: clear to auscultation bilaterally GI: Soft, minimally distended positive bowel sounds tolerating some solid vegetarian diet, positive BM.  Lab Results:  Recent Labs    07/16/17 0520 07/17/17 0523  WBC 12.2* 9.1  HGB 10.9* 10.6*  HCT 33.7* 32.8*  PLT 369 388    BMET Recent Labs    07/16/17 0520 07/17/17 0523  NA 136 136  K 3.3* 4.0  CL 100 103  CO2 28 27  GLUCOSE 107* 102*  BUN <5* <5*  CREATININE 0.53* 0.50*  CALCIUM 8.0* 8.5*   PT/INR No results for input(s): LABPROT, INR in the last 72 hours.  Recent Labs  Lab 07/14/17 0943  AST 43*  ALT 30  ALKPHOS 81  BILITOT 0.9  PROT 5.7*  ALBUMIN 1.9*     Lipase     Component  Value Date/Time   LIPASE 21 07/14/2017 0943     Medications: . amLODipine  5 mg Oral Daily  . carvedilol  25 mg Oral BID WC  . cloNIDine  0.1 mg Oral TID  . enoxaparin (LOVENOX) injection  30 mg Subcutaneous Q24H  . gabapentin  800 mg Oral TID  . hydrochlorothiazide  25 mg Oral Daily  . lisinopril  40 mg Oral BID  . nicotine  14 mg Transdermal Daily  . pantoprazole  40 mg Oral Q0600  . tamsulosin  0.4 mg Oral Daily   . dextrose 5 % and 0.9 % NaCl with KCl 40 mEq/L 75 mL/hr at 07/17/17 0600  . piperacillin-tazobactam (ZOSYN)  IV Stopped (07/17/17 0435)    Assessment/Plan Hypertension- severe on multiple agents Hypokalemia- resolved K+ 4.0 Hypomagnesemia - Mg 1.8, resolved 7/16 Chronic anemia - Hg 10.9 from 9.2, stable Tobacco use- nicotine patch Chronic pain Right hilum/right upper lobe mass/masslike consolidation -CXR 7/16  -CT of the chest with contrast today 7/17  Small bowel obstruction - XR 7/14 contrast in right colon - tolerating liquids and having bowel function  -Repeat CT 7/17   S/P laparoscopic partial cholecystectomy 04/29/2017, Dr. Ralene Ok Bile leak with postop stent placement and stent removal, Dr.CarlGessner  Fen: IV fluids/vegetarian diet ID: Zosyn 7/14=>>day 3 DVT: Lovenox Follow-up: TBD   Plan: After discussion with Dr. Redmond Pulling we will plan to go forward with a CT  of the chest with contrast.  We will also obtain a CT of the abdomen pelvis with contrast today. Check labs in a.m.  Decrease K+ in IV with next bag of fluid.       LOS: 3 days    Nycole Kawahara 07/17/2017 [phone]

## 2017-07-17 NOTE — Progress Notes (Signed)
Daily Rounding Note  07/17/2017, 9:29 AM  LOS: 3 days   SUBJECTIVE:   Chief complaint: intermittent abd pain/discomfort persists.  Says it last up to 90 minutes and is associated with IV infusion of abx.        Tolerating solid diet.  Having BM's.   Dr Kae Heller switched chest xray to chest CT and this AM abd/pelvic CT added due to difficult to read c/o abd pain.  CTs not yet performed.    OBJECTIVE:         Vital signs in last 24 hours:    Temp:  [97.7 F (36.5 C)-98 F (36.7 C)] 97.7 F (36.5 C) (07/17 0515) Pulse Rate:  [72-79] 72 (07/17 0515) Resp:  [14-17] 16 (07/17 0515) BP: (143-162)/(87-94) 145/89 (07/17 0515) SpO2:  [99 %-100 %] 100 % (07/17 0515) Last BM Date: 07/16/17 Filed Weights   07/14/17 0416  Weight: 138 lb (62.6 kg)   General: frail but comfortable and not ill looking   Heart: RRR Chest: clear bil.  No labored breathing or cough Abdomen: soft, ND.  Slight right mid/upper quadrant tenderness.  BS normal but hypoactive.    Extremities: no CCE Neuro/Psych:  Oriented x 3.  No limb weakness or tremor.  Fully alert.    Intake/Output from previous day: 07/16 0701 - 07/17 0700 In: 2553.6 [P.O.:594; I.V.:1809.7; IV Piggyback:149.9] Out: 2800 [Urine:2800]  Intake/Output this shift: Total I/O In: -  Out: 275 [Urine:275]  Lab Results: Recent Labs    07/15/17 0448 07/16/17 0520 07/17/17 0523  WBC 13.1* 12.2* 9.1  HGB 9.2* 10.9* 10.6*  HCT 28.9* 33.7* 32.8*  PLT 296 369 388   BMET Recent Labs    07/15/17 0448 07/16/17 0520 07/17/17 0523  NA 136 136 136  K 2.7* 3.3* 4.0  CL 102 100 103  CO2 29 28 27   GLUCOSE 122* 107* 102*  BUN <5* <5* <5*  CREATININE 0.56* 0.53* 0.50*  CALCIUM 7.5* 8.0* 8.5*   LFT Recent Labs    07/14/17 0943  PROT 5.7*  ALBUMIN 1.9*  AST 43*  ALT 30  ALKPHOS 81  BILITOT 0.9  BILIDIR 0.3*  IBILI 0.6   PT/INR No results for input(s): LABPROT, INR in the last  72 hours. Hepatitis Panel No results for input(s): HEPBSAG, HCVAB, HEPAIGM, HEPBIGM in the last 72 hours.  Studies/Results: Dg Chest 2 View  Result Date: 07/16/2017 CLINICAL DATA:  One-week history of fever and productive cough. Acute onset of shortness of breath this morning. Current smoker. EXAM: CHEST - 2 VIEW COMPARISON:  10/10/2015. FINDINGS: Cardiac silhouette normal in size, unchanged. Thoracic aorta mildly atherosclerotic, unchanged. Mass or masslike consolidation involving the RIGHT hilum extending into the RIGHT UPPER LOBE. Linear atelectasis in the adjacent RIGHT UPPER LOBE and in the RIGHT MIDDLE LOBE. LEFT lung clear. Small RIGHT pleural effusion. Degenerative changes involving the thoracic spine. Gaseous distention of numerous loops of small bowel in the visualized upper abdomen, demonstrating air-fluid levels. IMPRESSION: 1. Mass or masslike consolidation involving the RIGHT hilum extending into the RIGHT UPPER LOBE. 2. Linear atelectasis in the adjacent RIGHT UPPER LOBE and in the RIGHT MIDDLE LOBE. 3. Small RIGHT pleural effusion. 4. Partial small bowel obstruction as noted on recent prior abdominal imaging. CT of the chest with contrast may be helpful in further evaluation to confirm or deny the presence of a RIGHT hilar mass. These results will be called to the ordering clinician or representative by the  Psychologist, clinical, and communication documented in the PACS or zVision Dashboard. Electronically Signed   By: Evangeline Dakin M.D.   On: 07/16/2017 16:02   Scheduled Meds: . amLODipine  5 mg Oral Daily  . carvedilol  25 mg Oral BID WC  . cloNIDine  0.1 mg Oral TID  . enoxaparin (LOVENOX) injection  30 mg Subcutaneous Q24H  . gabapentin  800 mg Oral TID  . hydrochlorothiazide  25 mg Oral Daily  . iopamidol      . lisinopril  40 mg Oral BID  . nicotine  14 mg Transdermal Daily  . pantoprazole  40 mg Oral Q0600  . tamsulosin  0.4 mg Oral Daily   Continuous Infusions: .  dextrose 5 % and 0.9 % NaCl with KCl 20 mEq/L    . piperacillin-tazobactam (ZOSYN)  IV 3.375 g (07/17/17 0833)   PRN Meds:.acetaminophen, morphine injection, ondansetron **OR** ondansetron (ZOFRAN) IV, oxyCODONE.   ASSESMENT:   * Subtotal cholecystectomy 04/28/2017. S/p ERCP/stent placement 4/30 for post op bile leak. Stent removed 06/26/17 and no evidence bile leak at cholangiogram. Sac-like structure on [place] CT likely represents remnant portion of GB.  Empiric Zosyn in place in case of Biloma and for pulm indication. No fevers >24 hours; Leukocytosis resolved.  CT abdomen/pelvis pending  *CGE in setting PSBO, both resolved. Tolerating solids, Po Protonix in place.    * Hypokalemia. Resolved post IV and oral KCl.  * RUL consolidation. Day 4 Zosyn.  CT chest ordered.    * Normocytic anemia.  Stable.    *   Hypertension.  Continues on multiple meds per home routine.        PLAN   *   Await completion of CTs.  Continue vegetarian, solid diet (NPO now til after CT)    [name]  07/17/2017, 9:29 AM Phone [phone]

## 2017-07-17 NOTE — Care Management Important Message (Signed)
Important Message  Patient Details  Name: Gerald Farrell MRN: [id number] Date of Birth: 1945/04/29   Medicare Important Message Given:  Yes    Orbie Pyo 07/17/2017, 3:14 PM

## 2017-07-17 NOTE — Consult Note (Signed)
Name: Gerald Farrell MRN: [id number] DOB: 28-Apr-1945    ADMISSION DATE:  07/14/2017 CONSULTATION DATE:  7/17  REFERRING MD :  Dr. Redmond Pulling CCS  CHIEF COMPLAINT:  Hemoptysis  HISTORY OF PRESENT ILLNESS: 72 year old male with past medical history as below, which is significant for basal cell carcinoma on the right arm and acute cholecystitis.  He is approximately 30-pack-year smoker.  His medical course was relatively uncomplicated up until about April 2019 when he developed cholecystitis.  At that time course became complicated for bile leak requiring drain and eventually ERCP with sphincterotomy and biliary stent.  Then on June 26 he had the biliary stent removed.  Since that time he complained of intermittent abdominal pain which became worse on the evening of 7/14.  That same night he developed a cough and experienced 3 episodes of hemoptysis in total measuring less than a teaspoon.  No more coughing after that time.  He presented to the emergency department was found to have a bowel obstruction.  He was treated with conservative therapies.  Chest x-ray was done on 7/16 and demonstrated a focal consolidation.  CT was concerning for lung abscess.  PCCM was asked to see.  SIGNIFICANT EVENTS  7/14 admit for bowel obstruction   STUDIES:  7/14 ct abd > Small-bowel obstruction with transition in the right upper quadrant where there is apparent herniation of loops of small bowel through an omental defect and concerning for an internal hernia. There is no evidence of pneumatosis or portal venous gas. Sac-like structure in the expected location of the gallbladder likely represents the gallbladder. There is a port history of cholecystectomy. No cholecystectomy clips identified. Small ascites and small bilateral pleural effusions. 7/17 CT abd pelv chest > Right upper lobe bacterial pneumonia with possible early lung abscesses. Given the associated bowing of the minor fissure, consider Klebsiella  pneumonia. Radiographic follow-up recommended to document clearing and exclude underlying malignancy. Probable associated reactive right hilar adenopathy. Progressive distal small bowel obstruction. Specific etiology is not delineated, although based on previous study and location of the cecum, this could be secondary to an internal hernia or adhesions. Progressive ascites and generalized soft tissue edema. No focal extraluminal fluid collection.   PAST MEDICAL HISTORY :   has a past medical history of Acute cholecystitis (04/27/2017), Alcohol abuse, Basal cell carcinoma, and Hypertension.  has a past surgical history that includes Replacement total knee bilateral; Cholecystectomy (N/A, 04/28/2017); Excision basal cell carcinoma; Lumbar laminectomy; ERCP (N/A, 04/30/2017); Endoscopic retrograde cholangiopancreatography (ercp) with propofol (N/A, 06/26/2017); and Stent removal (06/26/2017). Prior to Admission medications   Medication Sig Start Date End Date Taking? Authorizing Provider  acetaminophen (TYLENOL) 325 MG tablet Take 650 mg by mouth every 6 (six) hours as needed for moderate pain or headache.   Yes [provider]  amLODipine (NORVASC) 10 MG tablet Take 5 mg by mouth daily.   Yes [provider]  carvedilol (COREG) 25 MG tablet Take 25 mg by mouth 2 (two) times daily with a meal.   Yes [provider]  cloNIDine (CATAPRES) 0.1 MG tablet Take 0.1 mg by mouth 3 (three) times daily.   Yes [provider]  ENSURE (ENSURE) Take 0.5 Cans by mouth 3 (three) times daily between meals.   Yes [provider]  gabapentin (NEURONTIN) 400 MG capsule Take 800 mg by mouth 3 (three) times daily.   Yes [provider]  hydrochlorothiazide (HYDRODIURIL) 25 MG tablet Take 25 mg by mouth daily.   Yes  [provider]  ibuprofen (ADVIL,MOTRIN) 200 MG tablet Take 600 mg by mouth every 6 (six) hours as needed for moderate pain.    Yes [provider]  ketoconazole (NIZORAL) 2 % cream Apply 1 application topically daily as needed for irritation.   Yes [provider]  lisinopril (PRINIVIL,ZESTRIL) 40 MG tablet Take 40 mg by mouth 2 (two) times daily.    Yes [provider]  meloxicam (MOBIC) 15 MG tablet Take 15 mg by mouth daily.   Yes [provider]  methocarbamol (ROBAXIN) 750 MG tablet Take 750 mg by mouth 3 (three) times daily as needed for muscle spasms.    Yes [provider]  omeprazole (PRILOSEC) 20 MG capsule Take 40 mg by mouth daily.    Yes [provider]  oxycodone (OXY-IR) 5 MG capsule Take 5 mg by mouth 3 (three) times daily as needed for pain.   Yes [provider]  Potassium Chloride ER 20 MEQ TBCR Take 20 mEq by mouth daily. 06/21/17  Yes Gatha Mayer, MD  tamsulosin (FLOMAX) 0.4 MG CAPS capsule Take 0.4 mg by mouth daily.   Yes [provider]   No Known Allergies  FAMILY HISTORY:  family history includes Heart disease in his father and mother. SOCIAL HISTORY:  reports that he has been smoking cigarettes.  He has been smoking about 0.30 packs per day. He has never used smokeless tobacco. He reports that he drank alcohol. He reports that he has current or past drug history.  REVIEW OF SYSTEMS:   Bolds are positive  Constitutional: weight loss, gain, night sweats, Fevers, chills, fatigue .  HEENT: headaches, Sore throat, sneezing, nasal congestion, post nasal drip, Difficulty swallowing, Tooth/dental problems, visual complaints visual changes, ear ache CV:  chest pain, radiates: , Orthopnea, PND, swelling in lower extremities, dizziness, palpitations, syncope.  GI  heartburn, indigestion, abdominal pain, nausea, vomiting, diarrhea, change in bowel habits, loss of appetite, bloody stools.  Resp: cough, productive:, hemoptysis, dyspnea, chest pain, pleuritic.  Skin: rash or itching or icterus GU: dysuria, change in color of urine, urgency or frequency.  flank pain, hematuria  MS: joint pain or swelling. decreased range of motion  Psych: change in mood or affect. depression or anxiety.  Neuro: difficulty with speech, weakness, numbness, ataxia    SUBJECTIVE:   VITAL SIGNS: Temp:  [97.4 F (36.3 C)-98 F (36.7 C)] 97.4 F (36.3 C) (07/17 1411) Pulse Rate:  [72-75] 75 (07/17 1411) Resp:  [16-17] 16 (07/17 0515) BP: (130-145)/(87-89) 130/88 (07/17 1411) SpO2:  [99 %-100 %] 100 % (07/17 1411)  PHYSICAL EXAMINATION: General:  Frail older male in NAD Neuro:  ALert, oriented, non-focal HEENT:  [place]/AT, PERRL, no JVD Cardiovascular:  RRR, no MRG Lungs:  Diminished. Unlabored  Abdomen:  Soft Musculoskeletal:  No acute deformity. No edema Skin:  Grossly intact.   Recent Labs  Lab 07/15/17 0448 07/16/17 0520 07/17/17 0523  NA 136 136 136  K 2.7* 3.3* 4.0  CL 102 100 103  CO2 29 28 27   BUN <5* <5* <5*  CREATININE 0.56* 0.53* 0.50*  GLUCOSE 122* 107* 102*   Recent Labs  Lab 07/15/17 0448 07/16/17 0520 07/17/17 0523  HGB 9.2* 10.9* 10.6*  HCT 28.9* 33.7* 32.8*  WBC 13.1* 12.2* 9.1  PLT 296 369 388   Dg Chest 2 View  Result Date: 07/16/2017 CLINICAL DATA:  One-week history of fever and productive cough. Acute onset of shortness of breath this morning. Current smoker.  EXAM: CHEST - 2 VIEW COMPARISON:  10/10/2015. FINDINGS: Cardiac silhouette normal in size, unchanged. Thoracic aorta mildly atherosclerotic, unchanged. Mass or masslike consolidation involving the RIGHT hilum extending into the RIGHT UPPER LOBE. Linear atelectasis in the adjacent RIGHT UPPER LOBE and in the RIGHT MIDDLE LOBE. LEFT lung clear. Small RIGHT pleural effusion. Degenerative changes involving the thoracic spine. Gaseous distention of numerous loops of small bowel in the visualized upper abdomen, demonstrating air-fluid levels. IMPRESSION: 1. Mass or masslike consolidation involving the RIGHT hilum extending into the RIGHT UPPER LOBE. 2. Linear atelectasis in  the adjacent RIGHT UPPER LOBE and in the RIGHT MIDDLE LOBE. 3. Small RIGHT pleural effusion. 4. Partial small bowel obstruction as noted on recent prior abdominal imaging. CT of the chest with contrast may be helpful in further evaluation to confirm or deny the presence of a RIGHT hilar mass. These results will be called to the ordering clinician or representative by the Radiologist Assistant, and communication documented in the PACS or zVision Dashboard. Electronically Signed   By: Evangeline Dakin M.D.   On: 07/16/2017 16:02   Ct Chest W Contrast  Addendum Date: 07/17/2017   ADDENDUM REPORT: 07/17/2017 16:18 ADDENDUM: Results were further discussed by telephone with Dr. Greer Pickerel. The enteric contrast was administered on 07/14/2017 as part of the small bowel obstruction protocol. As noted, this contrast is now in the colon. The appendix is visualized on this CT and appears normal (best seen on coronal images 77 through 87). Current findings still support a progressive partial distal small bowel obstruction. Electronically Signed   By: Richardean Sale M.D.   On: 07/17/2017 16:18   Result Date: 07/17/2017 CLINICAL DATA:  Right hilar mass on radiographs. Bowel obstruction. History of basal cell carcinoma and hypertension. EXAM: CT CHEST, ABDOMEN, AND PELVIS WITH CONTRAST TECHNIQUE: Multidetector CT imaging of the chest, abdomen and pelvis was performed following the standard protocol during bolus administration of intravenous contrast. CONTRAST:  154mL OMNIPAQUE IOHEXOL 300 MG/ML  SOLN COMPARISON:  Chest radiographs 07/16/2017. Abdominopelvic CT 07/14/2017 and 04/27/2017. FINDINGS: CT CHEST FINDINGS Cardiovascular: There is atherosclerosis of the aorta, great vessels and coronary arteries. No acute vascular findings are demonstrated. The heart size is normal. There is no pericardial effusion. Mediastinum/Nodes: There are no enlarged axillary or mediastinal lymph nodes. Right hilar soft tissue measuring 2.7 x  1.1 cm on image 32/3 may reflect adenopathy, likely reactive in etiology. Small right thyroid nodule is of doubtful significance. The trachea and esophagus appear normal. Lungs/Pleura: Trace bilateral pleural effusions. There is focal consolidation in the anterior segment of the right upper lobe which was not present on the abdominal CT done 04/27/2017. This measures up to 6.9 x 5.0 cm on image 32/3. There is associated central low-density and small air bubbles. On the reformatted images, there is mild inferior bowing of the minor fissure. Surrounding this area of consolidation, there is patchy airspace disease elsewhere in the right upper lobe. There is linear atelectasis in both lower lobes. Mild underlying emphysema noted. Musculoskeletal/Chest wall: No chest wall mass or suspicious osseous findings. CT ABDOMEN AND PELVIS FINDINGS Hepatobiliary: The liver is normal in density without focal abnormality. No evidence of gallstones, gallbladder wall thickening or biliary dilatation. Pancreas: Unremarkable. No pancreatic ductal dilatation or surrounding inflammatory changes. Spleen: Normal in size without focal abnormality. Adrenals/Urinary Tract: Both adrenal glands appear normal. There are stable small renal cysts bilaterally. There is cortical scarring in the lower pole of the left kidney. No evidence of urinary tract  calculus or hydronephrosis. The bladder appears normal. Stomach/Bowel: There is a small amount of enteric contrast which is present in the proximal colon. The stomach and small bowel are fluid-filled and mildly dilated diffusely. This has progressed from the recent CT. The extreme terminal ileum appears decompressed, best seen on the coronal images. The cecum is located in the right mid abdomen. There is no significant colonic distention. There is mild wall thickening of the sigmoid colon without focal surrounding inflammation. Vascular/Lymphatic: There are no enlarged abdominal or pelvic lymph nodes.  There is aortic and branch vessel atherosclerosis. No acute vascular findings. Reproductive: The prostate gland and seminal vesicles appear normal. Other: There is progressive ascites with edema throughout the mesenteric and subcutaneous fat. No focal extraluminal fluid or air collections are seen. There is no extravasated enteric contrast. Musculoskeletal: No acute or significant osseous findings. There is extensive lumbar spondylosis associated with a convex right scoliosis. IMPRESSION: 1. Right upper lobe bacterial pneumonia with possible early lung abscesses. Given the associated bowing of the minor fissure, consider Klebsiella pneumonia. Radiographic follow-up recommended to document clearing and exclude underlying malignancy. Probable associated reactive right hilar adenopathy. 2. Progressive distal small bowel obstruction. Specific etiology is not delineated, although based on previous study and location of the cecum, this could be secondary to an internal hernia or adhesions. 3. Progressive ascites and generalized soft tissue edema. No focal extraluminal fluid collection. 4. Mild sigmoid colon wall thickening, nonspecific. 5. Aortic Atherosclerosis (ICD10-I70.0). No acute vascular findings. Electronically Signed: By: Richardean Sale M.D. On: 07/17/2017 14:53   Ct Abdomen Pelvis W Contrast  Addendum Date: 07/17/2017   ADDENDUM REPORT: 07/17/2017 16:18 ADDENDUM: Results were further discussed by telephone with Dr. Greer Pickerel. The enteric contrast was administered on 07/14/2017 as part of the small bowel obstruction protocol. As noted, this contrast is now in the colon. The appendix is visualized on this CT and appears normal (best seen on coronal images 77 through 87). Current findings still support a progressive partial distal small bowel obstruction. Electronically Signed   By: Richardean Sale M.D.   On: 07/17/2017 16:18   Result Date: 07/17/2017 CLINICAL DATA:  Right hilar mass on radiographs. Bowel  obstruction. History of basal cell carcinoma and hypertension. EXAM: CT CHEST, ABDOMEN, AND PELVIS WITH CONTRAST TECHNIQUE: Multidetector CT imaging of the chest, abdomen and pelvis was performed following the standard protocol during bolus administration of intravenous contrast. CONTRAST:  145mL OMNIPAQUE IOHEXOL 300 MG/ML  SOLN COMPARISON:  Chest radiographs 07/16/2017. Abdominopelvic CT 07/14/2017 and 04/27/2017. FINDINGS: CT CHEST FINDINGS Cardiovascular: There is atherosclerosis of the aorta, great vessels and coronary arteries. No acute vascular findings are demonstrated. The heart size is normal. There is no pericardial effusion. Mediastinum/Nodes: There are no enlarged axillary or mediastinal lymph nodes. Right hilar soft tissue measuring 2.7 x 1.1 cm on image 32/3 may reflect adenopathy, likely reactive in etiology. Small right thyroid nodule is of doubtful significance. The trachea and esophagus appear normal. Lungs/Pleura: Trace bilateral pleural effusions. There is focal consolidation in the anterior segment of the right upper lobe which was not present on the abdominal CT done 04/27/2017. This measures up to 6.9 x 5.0 cm on image 32/3. There is associated central low-density and small air bubbles. On the reformatted images, there is mild inferior bowing of the minor fissure. Surrounding this area of consolidation, there is patchy airspace disease elsewhere in the right upper lobe. There is linear atelectasis in both lower lobes. Mild underlying emphysema noted. Musculoskeletal/Chest wall: No  chest wall mass or suspicious osseous findings. CT ABDOMEN AND PELVIS FINDINGS Hepatobiliary: The liver is normal in density without focal abnormality. No evidence of gallstones, gallbladder wall thickening or biliary dilatation. Pancreas: Unremarkable. No pancreatic ductal dilatation or surrounding inflammatory changes. Spleen: Normal in size without focal abnormality. Adrenals/Urinary Tract: Both adrenal glands  appear normal. There are stable small renal cysts bilaterally. There is cortical scarring in the lower pole of the left kidney. No evidence of urinary tract calculus or hydronephrosis. The bladder appears normal. Stomach/Bowel: There is a small amount of enteric contrast which is present in the proximal colon. The stomach and small bowel are fluid-filled and mildly dilated diffusely. This has progressed from the recent CT. The extreme terminal ileum appears decompressed, best seen on the coronal images. The cecum is located in the right mid abdomen. There is no significant colonic distention. There is mild wall thickening of the sigmoid colon without focal surrounding inflammation. Vascular/Lymphatic: There are no enlarged abdominal or pelvic lymph nodes. There is aortic and branch vessel atherosclerosis. No acute vascular findings. Reproductive: The prostate gland and seminal vesicles appear normal. Other: There is progressive ascites with edema throughout the mesenteric and subcutaneous fat. No focal extraluminal fluid or air collections are seen. There is no extravasated enteric contrast. Musculoskeletal: No acute or significant osseous findings. There is extensive lumbar spondylosis associated with a convex right scoliosis. IMPRESSION: 1. Right upper lobe bacterial pneumonia with possible early lung abscesses. Given the associated bowing of the minor fissure, consider Klebsiella pneumonia. Radiographic follow-up recommended to document clearing and exclude underlying malignancy. Probable associated reactive right hilar adenopathy. 2. Progressive distal small bowel obstruction. Specific etiology is not delineated, although based on previous study and location of the cecum, this could be secondary to an internal hernia or adhesions. 3. Progressive ascites and generalized soft tissue edema. No focal extraluminal fluid collection. 4. Mild sigmoid colon wall thickening, nonspecific. 5. Aortic Atherosclerosis  (ICD10-I70.0). No acute vascular findings. Electronically Signed: By: Richardean Sale M.D. On: 07/17/2017 14:53    ASSESSMENT / PLAN:  Abnormal CT chest: Right sided anterior upper lobe consolidation suspicious for infectious etiology. PNA vs Lung abscess. Could also represent a post obstructive process. Hemoptysis was very minimal. Treatment options considered included conservative treatment with ABX for 6 weeks with re-imaging, and brochoscopy. Turns out the patient is going to the OR tomorrow for laparotomy, so we will plan to do FOB while under general anesthesia.   Plan: - Continue zosyn - Dr. Lamonte Sakai will proceed with bronchoscopy 7/18 while in OR for ex-lap.  Patient consented obtained. - Await culture/cytology data.  Small bowel obstruction  Plan: -To OR 7/18 under Dr. Redmond Pulling - Per general surgery  Georgann Housekeeper, AGACNP-BC [employer] [facility] Pulmonology/Critical Care Pager [phone] or [phone]  07/17/2017 5:45 PM

## 2017-07-17 NOTE — H&P (View-Only) (Signed)
Name: Gerald Farrell MRN: [id number] DOB: 07-Jan-1945    ADMISSION DATE:  07/14/2017 CONSULTATION DATE:  7/17  REFERRING MD :  Dr. Redmond Pulling CCS  CHIEF COMPLAINT:  Hemoptysis  HISTORY OF PRESENT ILLNESS: 72 year old male with past medical history as below, which is significant for basal cell carcinoma on the right arm and acute cholecystitis.  He is approximately 30-pack-year smoker.  His medical course was relatively uncomplicated up until about April 2019 when he developed cholecystitis.  At that time course became complicated for bile leak requiring drain and eventually ERCP with sphincterotomy and biliary stent.  Then on June 26 he had the biliary stent removed.  Since that time he complained of intermittent abdominal pain which became worse on the evening of 7/14.  That same night he developed a cough and experienced 3 episodes of hemoptysis in total measuring less than a teaspoon.  No more coughing after that time.  He presented to the emergency department was found to have a bowel obstruction.  He was treated with conservative therapies.  Chest x-ray was done on 7/16 and demonstrated a focal consolidation.  CT was concerning for lung abscess.  PCCM was asked to see.  SIGNIFICANT EVENTS  7/14 admit for bowel obstruction   STUDIES:  7/14 ct abd > Small-bowel obstruction with transition in the right upper quadrant where there is apparent herniation of loops of small bowel through an omental defect and concerning for an internal hernia. There is no evidence of pneumatosis or portal venous gas. Sac-like structure in the expected location of the gallbladder likely represents the gallbladder. There is a port history of cholecystectomy. No cholecystectomy clips identified. Small ascites and small bilateral pleural effusions. 7/17 CT abd pelv chest > Right upper lobe bacterial pneumonia with possible early lung abscesses. Given the associated bowing of the minor fissure, consider Klebsiella  pneumonia. Radiographic follow-up recommended to document clearing and exclude underlying malignancy. Probable associated reactive right hilar adenopathy. Progressive distal small bowel obstruction. Specific etiology is not delineated, although based on previous study and location of the cecum, this could be secondary to an internal hernia or adhesions. Progressive ascites and generalized soft tissue edema. No focal extraluminal fluid collection.   PAST MEDICAL HISTORY :   has a past medical history of Acute cholecystitis (04/27/2017), Alcohol abuse, Basal cell carcinoma, and Hypertension.  has a past surgical history that includes Replacement total knee bilateral; Cholecystectomy (N/A, 04/28/2017); Excision basal cell carcinoma; Lumbar laminectomy; ERCP (N/A, 04/30/2017); Endoscopic retrograde cholangiopancreatography (ercp) with propofol (N/A, 06/26/2017); and Stent removal (06/26/2017). Prior to Admission medications   Medication Sig Start Date End Date Taking? Authorizing Provider  acetaminophen (TYLENOL) 325 MG tablet Take 650 mg by mouth every 6 (six) hours as needed for moderate pain or headache.   Yes [provider]  amLODipine (NORVASC) 10 MG tablet Take 5 mg by mouth daily.   Yes [provider]  carvedilol (COREG) 25 MG tablet Take 25 mg by mouth 2 (two) times daily with a meal.   Yes [provider]  cloNIDine (CATAPRES) 0.1 MG tablet Take 0.1 mg by mouth 3 (three) times daily.   Yes [provider]  ENSURE (ENSURE) Take 0.5 Cans by mouth 3 (three) times daily between meals.   Yes [provider]  gabapentin (NEURONTIN) 400 MG capsule Take 800 mg by mouth 3 (three) times daily.   Yes [provider]  hydrochlorothiazide (HYDRODIURIL) 25 MG tablet Take 25 mg by mouth daily.   Yes  [provider]  ibuprofen (ADVIL,MOTRIN) 200 MG tablet Take 600 mg by mouth every 6 (six) hours as needed for moderate pain.    Yes [provider]  ketoconazole (NIZORAL) 2 % cream Apply 1 application topically daily as needed for irritation.   Yes [provider]  lisinopril (PRINIVIL,ZESTRIL) 40 MG tablet Take 40 mg by mouth 2 (two) times daily.    Yes [provider]  meloxicam (MOBIC) 15 MG tablet Take 15 mg by mouth daily.   Yes [provider]  methocarbamol (ROBAXIN) 750 MG tablet Take 750 mg by mouth 3 (three) times daily as needed for muscle spasms.    Yes [provider]  omeprazole (PRILOSEC) 20 MG capsule Take 40 mg by mouth daily.    Yes [provider]  oxycodone (OXY-IR) 5 MG capsule Take 5 mg by mouth 3 (three) times daily as needed for pain.   Yes [provider]  Potassium Chloride ER 20 MEQ TBCR Take 20 mEq by mouth daily. 06/21/17  Yes Gatha Mayer, MD  tamsulosin (FLOMAX) 0.4 MG CAPS capsule Take 0.4 mg by mouth daily.   Yes [provider]   No Known Allergies  FAMILY HISTORY:  family history includes Heart disease in his father and mother. SOCIAL HISTORY:  reports that he has been smoking cigarettes.  He has been smoking about 0.30 packs per day. He has never used smokeless tobacco. He reports that he drank alcohol. He reports that he has current or past drug history.  REVIEW OF SYSTEMS:   Bolds are positive  Constitutional: weight loss, gain, night sweats, Fevers, chills, fatigue .  HEENT: headaches, Sore throat, sneezing, nasal congestion, post nasal drip, Difficulty swallowing, Tooth/dental problems, visual complaints visual changes, ear ache CV:  chest pain, radiates: , Orthopnea, PND, swelling in lower extremities, dizziness, palpitations, syncope.  GI  heartburn, indigestion, abdominal pain, nausea, vomiting, diarrhea, change in bowel habits, loss of appetite, bloody stools.  Resp: cough, productive:, hemoptysis, dyspnea, chest pain, pleuritic.  Skin: rash or itching or icterus GU: dysuria, change in color of urine, urgency or frequency.  flank pain, hematuria  MS: joint pain or swelling. decreased range of motion  Psych: change in mood or affect. depression or anxiety.  Neuro: difficulty with speech, weakness, numbness, ataxia    SUBJECTIVE:   VITAL SIGNS: Temp:  [97.4 F (36.3 C)-98 F (36.7 C)] 97.4 F (36.3 C) (07/17 1411) Pulse Rate:  [72-75] 75 (07/17 1411) Resp:  [16-17] 16 (07/17 0515) BP: (130-145)/(87-89) 130/88 (07/17 1411) SpO2:  [99 %-100 %] 100 % (07/17 1411)  PHYSICAL EXAMINATION: General:  Frail older male in NAD Neuro:  ALert, oriented, non-focal HEENT:  [place]/AT, PERRL, no JVD Cardiovascular:  RRR, no MRG Lungs:  Diminished. Unlabored  Abdomen:  Soft Musculoskeletal:  No acute deformity. No edema Skin:  Grossly intact.   Recent Labs  Lab 07/15/17 0448 07/16/17 0520 07/17/17 0523  NA 136 136 136  K 2.7* 3.3* 4.0  CL 102 100 103  CO2 29 28 27   BUN <5* <5* <5*  CREATININE 0.56* 0.53* 0.50*  GLUCOSE 122* 107* 102*   Recent Labs  Lab 07/15/17 0448 07/16/17 0520 07/17/17 0523  HGB 9.2* 10.9* 10.6*  HCT 28.9* 33.7* 32.8*  WBC 13.1* 12.2* 9.1  PLT 296 369 388   Dg Chest 2 View  Result Date: 07/16/2017 CLINICAL DATA:  One-week history of fever and productive cough. Acute onset of shortness of breath this morning. Current smoker.  EXAM: CHEST - 2 VIEW COMPARISON:  10/10/2015. FINDINGS: Cardiac silhouette normal in size, unchanged. Thoracic aorta mildly atherosclerotic, unchanged. Mass or masslike consolidation involving the RIGHT hilum extending into the RIGHT UPPER LOBE. Linear atelectasis in the adjacent RIGHT UPPER LOBE and in the RIGHT MIDDLE LOBE. LEFT lung clear. Small RIGHT pleural effusion. Degenerative changes involving the thoracic spine. Gaseous distention of numerous loops of small bowel in the visualized upper abdomen, demonstrating air-fluid levels. IMPRESSION: 1. Mass or masslike consolidation involving the RIGHT hilum extending into the RIGHT UPPER LOBE. 2. Linear atelectasis in  the adjacent RIGHT UPPER LOBE and in the RIGHT MIDDLE LOBE. 3. Small RIGHT pleural effusion. 4. Partial small bowel obstruction as noted on recent prior abdominal imaging. CT of the chest with contrast may be helpful in further evaluation to confirm or deny the presence of a RIGHT hilar mass. These results will be called to the ordering clinician or representative by the Radiologist Assistant, and communication documented in the PACS or zVision Dashboard. Electronically Signed   By: Evangeline Dakin M.D.   On: 07/16/2017 16:02   Ct Chest W Contrast  Addendum Date: 07/17/2017   ADDENDUM REPORT: 07/17/2017 16:18 ADDENDUM: Results were further discussed by telephone with Dr. Greer Pickerel. The enteric contrast was administered on 07/14/2017 as part of the small bowel obstruction protocol. As noted, this contrast is now in the colon. The appendix is visualized on this CT and appears normal (best seen on coronal images 77 through 87). Current findings still support a progressive partial distal small bowel obstruction. Electronically Signed   By: Richardean Sale M.D.   On: 07/17/2017 16:18   Result Date: 07/17/2017 CLINICAL DATA:  Right hilar mass on radiographs. Bowel obstruction. History of basal cell carcinoma and hypertension. EXAM: CT CHEST, ABDOMEN, AND PELVIS WITH CONTRAST TECHNIQUE: Multidetector CT imaging of the chest, abdomen and pelvis was performed following the standard protocol during bolus administration of intravenous contrast. CONTRAST:  16mL OMNIPAQUE IOHEXOL 300 MG/ML  SOLN COMPARISON:  Chest radiographs 07/16/2017. Abdominopelvic CT 07/14/2017 and 04/27/2017. FINDINGS: CT CHEST FINDINGS Cardiovascular: There is atherosclerosis of the aorta, great vessels and coronary arteries. No acute vascular findings are demonstrated. The heart size is normal. There is no pericardial effusion. Mediastinum/Nodes: There are no enlarged axillary or mediastinal lymph nodes. Right hilar soft tissue measuring 2.7 x  1.1 cm on image 32/3 may reflect adenopathy, likely reactive in etiology. Small right thyroid nodule is of doubtful significance. The trachea and esophagus appear normal. Lungs/Pleura: Trace bilateral pleural effusions. There is focal consolidation in the anterior segment of the right upper lobe which was not present on the abdominal CT done 04/27/2017. This measures up to 6.9 x 5.0 cm on image 32/3. There is associated central low-density and small air bubbles. On the reformatted images, there is mild inferior bowing of the minor fissure. Surrounding this area of consolidation, there is patchy airspace disease elsewhere in the right upper lobe. There is linear atelectasis in both lower lobes. Mild underlying emphysema noted. Musculoskeletal/Chest wall: No chest wall mass or suspicious osseous findings. CT ABDOMEN AND PELVIS FINDINGS Hepatobiliary: The liver is normal in density without focal abnormality. No evidence of gallstones, gallbladder wall thickening or biliary dilatation. Pancreas: Unremarkable. No pancreatic ductal dilatation or surrounding inflammatory changes. Spleen: Normal in size without focal abnormality. Adrenals/Urinary Tract: Both adrenal glands appear normal. There are stable small renal cysts bilaterally. There is cortical scarring in the lower pole of the left kidney. No evidence of urinary tract  calculus or hydronephrosis. The bladder appears normal. Stomach/Bowel: There is a small amount of enteric contrast which is present in the proximal colon. The stomach and small bowel are fluid-filled and mildly dilated diffusely. This has progressed from the recent CT. The extreme terminal ileum appears decompressed, best seen on the coronal images. The cecum is located in the right mid abdomen. There is no significant colonic distention. There is mild wall thickening of the sigmoid colon without focal surrounding inflammation. Vascular/Lymphatic: There are no enlarged abdominal or pelvic lymph nodes.  There is aortic and branch vessel atherosclerosis. No acute vascular findings. Reproductive: The prostate gland and seminal vesicles appear normal. Other: There is progressive ascites with edema throughout the mesenteric and subcutaneous fat. No focal extraluminal fluid or air collections are seen. There is no extravasated enteric contrast. Musculoskeletal: No acute or significant osseous findings. There is extensive lumbar spondylosis associated with a convex right scoliosis. IMPRESSION: 1. Right upper lobe bacterial pneumonia with possible early lung abscesses. Given the associated bowing of the minor fissure, consider Klebsiella pneumonia. Radiographic follow-up recommended to document clearing and exclude underlying malignancy. Probable associated reactive right hilar adenopathy. 2. Progressive distal small bowel obstruction. Specific etiology is not delineated, although based on previous study and location of the cecum, this could be secondary to an internal hernia or adhesions. 3. Progressive ascites and generalized soft tissue edema. No focal extraluminal fluid collection. 4. Mild sigmoid colon wall thickening, nonspecific. 5. Aortic Atherosclerosis (ICD10-I70.0). No acute vascular findings. Electronically Signed: By: Richardean Sale M.D. On: 07/17/2017 14:53   Ct Abdomen Pelvis W Contrast  Addendum Date: 07/17/2017   ADDENDUM REPORT: 07/17/2017 16:18 ADDENDUM: Results were further discussed by telephone with Dr. Greer Pickerel. The enteric contrast was administered on 07/14/2017 as part of the small bowel obstruction protocol. As noted, this contrast is now in the colon. The appendix is visualized on this CT and appears normal (best seen on coronal images 77 through 87). Current findings still support a progressive partial distal small bowel obstruction. Electronically Signed   By: Richardean Sale M.D.   On: 07/17/2017 16:18   Result Date: 07/17/2017 CLINICAL DATA:  Right hilar mass on radiographs. Bowel  obstruction. History of basal cell carcinoma and hypertension. EXAM: CT CHEST, ABDOMEN, AND PELVIS WITH CONTRAST TECHNIQUE: Multidetector CT imaging of the chest, abdomen and pelvis was performed following the standard protocol during bolus administration of intravenous contrast. CONTRAST:  174mL OMNIPAQUE IOHEXOL 300 MG/ML  SOLN COMPARISON:  Chest radiographs 07/16/2017. Abdominopelvic CT 07/14/2017 and 04/27/2017. FINDINGS: CT CHEST FINDINGS Cardiovascular: There is atherosclerosis of the aorta, great vessels and coronary arteries. No acute vascular findings are demonstrated. The heart size is normal. There is no pericardial effusion. Mediastinum/Nodes: There are no enlarged axillary or mediastinal lymph nodes. Right hilar soft tissue measuring 2.7 x 1.1 cm on image 32/3 may reflect adenopathy, likely reactive in etiology. Small right thyroid nodule is of doubtful significance. The trachea and esophagus appear normal. Lungs/Pleura: Trace bilateral pleural effusions. There is focal consolidation in the anterior segment of the right upper lobe which was not present on the abdominal CT done 04/27/2017. This measures up to 6.9 x 5.0 cm on image 32/3. There is associated central low-density and small air bubbles. On the reformatted images, there is mild inferior bowing of the minor fissure. Surrounding this area of consolidation, there is patchy airspace disease elsewhere in the right upper lobe. There is linear atelectasis in both lower lobes. Mild underlying emphysema noted. Musculoskeletal/Chest wall: No  chest wall mass or suspicious osseous findings. CT ABDOMEN AND PELVIS FINDINGS Hepatobiliary: The liver is normal in density without focal abnormality. No evidence of gallstones, gallbladder wall thickening or biliary dilatation. Pancreas: Unremarkable. No pancreatic ductal dilatation or surrounding inflammatory changes. Spleen: Normal in size without focal abnormality. Adrenals/Urinary Tract: Both adrenal glands  appear normal. There are stable small renal cysts bilaterally. There is cortical scarring in the lower pole of the left kidney. No evidence of urinary tract calculus or hydronephrosis. The bladder appears normal. Stomach/Bowel: There is a small amount of enteric contrast which is present in the proximal colon. The stomach and small bowel are fluid-filled and mildly dilated diffusely. This has progressed from the recent CT. The extreme terminal ileum appears decompressed, best seen on the coronal images. The cecum is located in the right mid abdomen. There is no significant colonic distention. There is mild wall thickening of the sigmoid colon without focal surrounding inflammation. Vascular/Lymphatic: There are no enlarged abdominal or pelvic lymph nodes. There is aortic and branch vessel atherosclerosis. No acute vascular findings. Reproductive: The prostate gland and seminal vesicles appear normal. Other: There is progressive ascites with edema throughout the mesenteric and subcutaneous fat. No focal extraluminal fluid or air collections are seen. There is no extravasated enteric contrast. Musculoskeletal: No acute or significant osseous findings. There is extensive lumbar spondylosis associated with a convex right scoliosis. IMPRESSION: 1. Right upper lobe bacterial pneumonia with possible early lung abscesses. Given the associated bowing of the minor fissure, consider Klebsiella pneumonia. Radiographic follow-up recommended to document clearing and exclude underlying malignancy. Probable associated reactive right hilar adenopathy. 2. Progressive distal small bowel obstruction. Specific etiology is not delineated, although based on previous study and location of the cecum, this could be secondary to an internal hernia or adhesions. 3. Progressive ascites and generalized soft tissue edema. No focal extraluminal fluid collection. 4. Mild sigmoid colon wall thickening, nonspecific. 5. Aortic Atherosclerosis  (ICD10-I70.0). No acute vascular findings. Electronically Signed: By: Richardean Sale M.D. On: 07/17/2017 14:53    ASSESSMENT / PLAN:  Abnormal CT chest: Right sided anterior upper lobe consolidation suspicious for infectious etiology. PNA vs Lung abscess. Could also represent a post obstructive process. Hemoptysis was very minimal. Treatment options considered included conservative treatment with ABX for 6 weeks with re-imaging, and brochoscopy. Turns out the patient is going to the OR tomorrow for laparotomy, so we will plan to do FOB while under general anesthesia.   Plan: - Continue zosyn - Dr. Lamonte Sakai will proceed with bronchoscopy 7/18 while in OR for ex-lap.  Patient consented obtained. - Await culture/cytology data.  Small bowel obstruction  Plan: -To OR 7/18 under Dr. Redmond Pulling - Per general surgery  Georgann Housekeeper, AGACNP-BC [facility] Pager [phone] or [phone]  07/17/2017 5:45 PM

## 2017-07-18 ENCOUNTER — Encounter (HOSPITAL_COMMUNITY): Admission: EM | Disposition: A | Discharge status: Home / Self Care | Payer: Self-pay | Source: Home / Self Care

## 2017-07-18 ENCOUNTER — Inpatient Hospital Stay (HOSPITAL_COMMUNITY): Payer: Medicare Other | Admitting: Certified Registered Nurse Anesthetist

## 2017-07-18 ENCOUNTER — Encounter (HOSPITAL_COMMUNITY): Payer: Self-pay | Admitting: Certified Registered Nurse Anesthetist

## 2017-07-18 DIAGNOSIS — R059 Cough, unspecified: Secondary | ICD-10-CM

## 2017-07-18 DIAGNOSIS — R05 Cough: Secondary | ICD-10-CM

## 2017-07-18 DIAGNOSIS — R9389 Abnormal findings on diagnostic imaging of other specified body structures: Secondary | ICD-10-CM

## 2017-07-18 DIAGNOSIS — Z9889 Other specified postprocedural states: Secondary | ICD-10-CM

## 2017-07-18 HISTORY — PX: VIDEO BRONCHOSCOPY: SHX5072

## 2017-07-18 HISTORY — PX: LAPAROSCOPY: SHX197

## 2017-07-18 HISTORY — PX: LAPAROTOMY: SHX154

## 2017-07-18 LAB — PREPARE RBC (CROSSMATCH)

## 2017-07-18 LAB — CBC
HCT: 39.1 % (ref 39.0–52.0)
HEMATOCRIT: 30.4 % — AB (ref 39.0–52.0)
HEMOGLOBIN: 9.8 g/dL — AB (ref 13.0–17.0)
Hemoglobin: 13 g/dL (ref 13.0–17.0)
MCH: 28.7 pg (ref 26.0–34.0)
MCH: 29.2 pg (ref 26.0–34.0)
MCHC: 32.2 g/dL (ref 30.0–36.0)
MCHC: 33.2 g/dL (ref 30.0–36.0)
MCV: 88.9 fL (ref 78.0–100.0)
MCV: 87.9 fL (ref 78.0–100.0)
PLATELETS: 333 10*3/uL (ref 150–400)
Platelets: 371 10*3/uL (ref 150–400)
RBC: 3.42 MIL/uL — AB (ref 4.22–5.81)
RBC: 4.45 MIL/uL (ref 4.22–5.81)
RDW: 14.1 % (ref 11.5–15.5)
RDW: 14.1 % (ref 11.5–15.5)
WBC: 9.8 10*3/uL (ref 4.0–10.5)
WBC: 22.8 10*3/uL — ABNORMAL HIGH (ref 4.0–10.5)

## 2017-07-18 LAB — BASIC METABOLIC PANEL
ANION GAP: 7 (ref 5–15)
Anion gap: 6 (ref 5–15)
BUN: <5 mg/dL — ABNORMAL LOW (ref 8–23)
BUN: 5 mg/dL — AB (ref 8–23)
CHLORIDE: 104 mmol/L (ref 98–111)
CO2: 24 mmol/L (ref 22–32)
CO2: 21 mmol/L — ABNORMAL LOW (ref 22–32)
Calcium: 8.3 mg/dL — ABNORMAL LOW (ref 8.9–10.3)
Calcium: 8.1 mg/dL — ABNORMAL LOW (ref 8.9–10.3)
Chloride: 105 mmol/L (ref 98–111)
Creatinine, Ser: 0.58 mg/dL — ABNORMAL LOW (ref 0.61–1.24)
Creatinine, Ser: 0.43 mg/dL — ABNORMAL LOW (ref 0.61–1.24)
GFR calc Af Amer: >60 mL/min (ref >60)
GFR calc non Af Amer: >60 mL/min (ref >60)
GLUCOSE: 101 mg/dL — AB (ref 70–99)
GLUCOSE: 133 mg/dL — AB (ref 70–99)
POTASSIUM: 3.9 mmol/L (ref 3.5–5.1)
POTASSIUM: 3.5 mmol/L (ref 3.5–5.1)
Sodium: 135 mmol/L (ref 135–145)
Sodium: 132 mmol/L — ABNORMAL LOW (ref 135–145)

## 2017-07-18 LAB — POCT I-STAT 4, (NA,K, GLUC, HGB,HCT)
GLUCOSE: 115 mg/dL — AB (ref 70–99)
HCT: 25 % — ABNORMAL LOW (ref 39.0–52.0)
Hemoglobin: 8.5 g/dL — ABNORMAL LOW (ref 13.0–17.0)
Potassium: 5 mmol/L (ref 3.5–5.1)
SODIUM: 135 mmol/L (ref 135–145)

## 2017-07-18 LAB — MAGNESIUM: Magnesium: 1.4 mg/dL — ABNORMAL LOW (ref 1.7–2.4)

## 2017-07-18 SURGERY — LAPAROSCOPY, DIAGNOSTIC
Anesthesia: General | Site: Abdomen

## 2017-07-18 MED ORDER — SUGAMMADEX SODIUM 200 MG/2ML IV SOLN
INTRAVENOUS | Status: AC
  Filled 2017-07-18: qty 2

## 2017-07-18 MED ORDER — LIDOCAINE 2% (20 MG/ML) 5 ML SYRINGE
INTRAMUSCULAR | Status: DC | PRN
  Administered 2017-07-18: 100 mg via INTRAVENOUS

## 2017-07-18 MED ORDER — SODIUM CHLORIDE 0.9% IV SOLUTION
Freq: Once | INTRAVENOUS | Status: AC
  Administered 2017-07-18: 10 mL/h via INTRAVENOUS

## 2017-07-18 MED ORDER — FENTANYL CITRATE (PF) 250 MCG/5ML IJ SOLN
INTRAMUSCULAR | Status: AC
Start: 2017-07-18 — End: ?
  Filled 2017-07-18: qty 5

## 2017-07-18 MED ORDER — ENOXAPARIN SODIUM 40 MG/0.4ML ~~LOC~~ SOLN
40.0000 mg | SUBCUTANEOUS | Status: DC
  Administered 2017-07-19 – 2017-07-23 (×5): 40 mg via SUBCUTANEOUS
  Filled 2017-07-18 (×5): qty 0.4

## 2017-07-18 MED ORDER — LACTATED RINGERS IV SOLN
INTRAVENOUS | Status: DC | PRN
  Administered 2017-07-18: 17:00:00 via INTRAVENOUS

## 2017-07-18 MED ORDER — PHENYLEPHRINE 40 MCG/ML (10ML) SYRINGE FOR IV PUSH (FOR BLOOD PRESSURE SUPPORT)
PREFILLED_SYRINGE | INTRAVENOUS | Status: DC | PRN
  Administered 2017-07-18: 80 ug via INTRAVENOUS
  Administered 2017-07-18: 120 ug via INTRAVENOUS
  Administered 2017-07-18: 80 ug via INTRAVENOUS
  Administered 2017-07-18: 120 ug via INTRAVENOUS

## 2017-07-18 MED ORDER — CALCIUM CHLORIDE 10 % IV SOLN
INTRAVENOUS | Status: DC | PRN
  Administered 2017-07-18 (×2): 300 mg via INTRAVENOUS

## 2017-07-18 MED ORDER — NOREPINEPHRINE 4 MG/250ML-% IV SOLN
0.0000 ug/min | INTRAVENOUS | Status: DC
  Filled 2017-07-18: qty 250

## 2017-07-18 MED ORDER — ALBUMIN HUMAN 5 % IV SOLN
INTRAVENOUS | Status: DC | PRN
  Administered 2017-07-18 (×2): via INTRAVENOUS

## 2017-07-18 MED ORDER — DIPHENHYDRAMINE HCL 12.5 MG/5ML PO ELIX: 12.5000 mg | ORAL_SOLUTION | Freq: Four times a day (QID) | ORAL | Status: DC | PRN

## 2017-07-18 MED ORDER — HYDROMORPHONE HCL 1 MG/ML IJ SOLN
INTRAMUSCULAR | Status: AC
  Filled 2017-07-18: qty 1

## 2017-07-18 MED ORDER — FENTANYL CITRATE (PF) 250 MCG/5ML IJ SOLN
INTRAMUSCULAR | Status: DC | PRN
  Administered 2017-07-18 (×3): 50 ug via INTRAVENOUS
  Administered 2017-07-18: 100 ug via INTRAVENOUS

## 2017-07-18 MED ORDER — LACTATED RINGERS IV SOLN
INTRAVENOUS | Status: DC | PRN
  Administered 2017-07-18 (×2): via INTRAVENOUS

## 2017-07-18 MED ORDER — PROPOFOL 10 MG/ML IV BOLUS
INTRAVENOUS | Status: AC
  Filled 2017-07-18: qty 40

## 2017-07-18 MED ORDER — PHENYLEPHRINE 40 MCG/ML (10ML) SYRINGE FOR IV PUSH (FOR BLOOD PRESSURE SUPPORT)
PREFILLED_SYRINGE | INTRAVENOUS | Status: AC
  Filled 2017-07-18: qty 10

## 2017-07-18 MED ORDER — ONDANSETRON HCL 4 MG/2ML IJ SOLN: 4.0000 mg | Freq: Once | INTRAMUSCULAR | Status: DC | PRN

## 2017-07-18 MED ORDER — LACTATED RINGERS IV SOLN: INTRAVENOUS | Status: DC

## 2017-07-18 MED ORDER — PHENYLEPHRINE 40 MCG/ML (10ML) SYRINGE FOR IV PUSH (FOR BLOOD PRESSURE SUPPORT)
PREFILLED_SYRINGE | INTRAVENOUS | Status: AC
  Filled 2017-07-18: qty 20

## 2017-07-18 MED ORDER — HYDROMORPHONE HCL 1 MG/ML IJ SOLN
0.2500 mg | INTRAMUSCULAR | Status: DC | PRN
  Administered 2017-07-18 (×4): 0.5 mg via INTRAVENOUS

## 2017-07-18 MED ORDER — MIDAZOLAM HCL 2 MG/2ML IJ SOLN
INTRAMUSCULAR | Status: AC
  Filled 2017-07-18: qty 2

## 2017-07-18 MED ORDER — PROPOFOL 10 MG/ML IV BOLUS
INTRAVENOUS | Status: DC | PRN
  Administered 2017-07-18: 110 mg via INTRAVENOUS

## 2017-07-18 MED ORDER — BUPIVACAINE-EPINEPHRINE 0.25% -1:200000 IJ SOLN
INTRAMUSCULAR | Status: DC | PRN
  Administered 2017-07-18: 6 mL

## 2017-07-18 MED ORDER — ACETAMINOPHEN 10 MG/ML IV SOLN
1000.0000 mg | Freq: Four times a day (QID) | INTRAVENOUS | Status: AC
  Administered 2017-07-18 – 2017-07-19 (×4): 1000 mg via INTRAVENOUS
  Filled 2017-07-18 (×5): qty 100

## 2017-07-18 MED ORDER — ONDANSETRON HCL 4 MG/2ML IJ SOLN: 4.0000 mg | Freq: Four times a day (QID) | INTRAMUSCULAR | Status: DC | PRN

## 2017-07-18 MED ORDER — ROCURONIUM BROMIDE 10 MG/ML (PF) SYRINGE
PREFILLED_SYRINGE | INTRAVENOUS | Status: AC
  Filled 2017-07-18: qty 10

## 2017-07-18 MED ORDER — HEMOSTATIC AGENTS (NO CHARGE) OPTIME
TOPICAL | Status: DC | PRN
  Administered 2017-07-18 (×2): 1 via TOPICAL

## 2017-07-18 MED ORDER — HYDROMORPHONE 1 MG/ML IV SOLN
INTRAVENOUS | Status: DC
  Administered 2017-07-18: 20:00:00 via INTRAVENOUS
  Administered 2017-07-19: 3.8 mg via INTRAVENOUS
  Administered 2017-07-19: 2.6 mg via INTRAVENOUS
  Administered 2017-07-19: 2 mg via INTRAVENOUS
  Administered 2017-07-19: 3.3 mg via INTRAVENOUS
  Administered 2017-07-19: 4 mg via INTRAVENOUS
  Administered 2017-07-20: 3.4 mg via INTRAVENOUS
  Administered 2017-07-20: 10 mg via INTRAVENOUS
  Administered 2017-07-20: 2 mg via INTRAVENOUS
  Administered 2017-07-20: 3 mg via INTRAVENOUS
  Administered 2017-07-20: 13 mg via INTRAVENOUS
  Administered 2017-07-21: 2 mg via INTRAVENOUS
  Administered 2017-07-21: 1.8 mg via INTRAVENOUS
  Administered 2017-07-21: 17 mg via INTRAVENOUS
  Administered 2017-07-21: 2.2 mg via INTRAVENOUS
  Administered 2017-07-21: 4 mg via INTRAVENOUS
  Administered 2017-07-21: 12:00:00 via INTRAVENOUS
  Administered 2017-07-21: 1.4 mg via INTRAVENOUS
  Administered 2017-07-21: 6 mg via INTRAVENOUS
  Administered 2017-07-22: 0.4 mg via INTRAVENOUS
  Administered 2017-07-22: 2.2 mg via INTRAVENOUS
  Administered 2017-07-22: 1.4 mg via INTRAVENOUS
  Administered 2017-07-22: 2.6 mg via INTRAVENOUS
  Administered 2017-07-22: 1.8 mg via INTRAVENOUS
  Administered 2017-07-23: 3 mg via INTRAVENOUS
  Administered 2017-07-23: 1.6 mg via INTRAVENOUS
  Administered 2017-07-23: 3.4 mg via INTRAVENOUS
  Administered 2017-07-23: 2 mg via INTRAVENOUS
  Administered 2017-07-23: 14:00:00 via INTRAVENOUS
  Administered 2017-07-23: 0.8 mg via INTRAVENOUS
  Administered 2017-07-23: 2.2 mg via INTRAVENOUS
  Administered 2017-07-24: 1.2 mg via INTRAVENOUS
  Administered 2017-07-24: 1.8 mg via INTRAVENOUS
  Administered 2017-07-24: 2.4 mg via INTRAVENOUS
  Administered 2017-07-24: 1.6 mg via INTRAVENOUS
  Administered 2017-07-24: 1.2 mg via INTRAVENOUS
  Administered 2017-07-24: 3 mg via INTRAVENOUS
  Administered 2017-07-25: 1.6 mg via INTRAVENOUS
  Administered 2017-07-25: 2.8 mg via INTRAVENOUS
  Administered 2017-07-25: 1.4 mg via INTRAVENOUS
  Administered 2017-07-25: 6 mg via INTRAVENOUS
  Administered 2017-07-25: 11 mg via INTRAVENOUS
  Administered 2017-07-25: 17:00:00 via INTRAVENOUS
  Administered 2017-07-25: 11 mg via INTRAVENOUS
  Administered 2017-07-26: 2.4 mg via INTRAVENOUS
  Administered 2017-07-26: 1.8 mg via INTRAVENOUS
  Administered 2017-07-26: 2.6 mg via INTRAVENOUS
  Administered 2017-07-26: 1.4 mg via INTRAVENOUS
  Administered 2017-07-26: 1.8 mg via INTRAVENOUS
  Administered 2017-07-27: 1.7 mg via INTRAVENOUS
  Administered 2017-07-27: 1.6 mg via INTRAVENOUS
  Administered 2017-07-27: 0.8 mg via INTRAVENOUS
  Administered 2017-07-27: 2.4 mg via INTRAVENOUS
  Administered 2017-07-27: 1.4 mg via INTRAVENOUS
  Administered 2017-07-27: 1.2 mg via INTRAVENOUS
  Administered 2017-07-27: 19:00:00 via INTRAVENOUS
  Administered 2017-07-28: 1.2 mg via INTRAVENOUS
  Administered 2017-07-28: 1.6 mg via INTRAVENOUS
  Administered 2017-07-28: 2.4 mg via INTRAVENOUS
  Administered 2017-07-28: 2.8 mg via INTRAVENOUS
  Administered 2017-07-28: 2.4 mg via INTRAVENOUS
  Administered 2017-07-28: 2 mg via INTRAVENOUS
  Administered 2017-07-29: 25 mg via INTRAVENOUS
  Administered 2017-07-29: 16 mg via INTRAVENOUS
  Administered 2017-07-29: 1.2 mg via INTRAVENOUS
  Administered 2017-07-29: 10 mg via INTRAVENOUS
  Administered 2017-07-29: 2.8 mg via INTRAVENOUS
  Filled 2017-07-18 (×8): qty 25

## 2017-07-18 MED ORDER — LIDOCAINE 2% (20 MG/ML) 5 ML SYRINGE
INTRAMUSCULAR | Status: AC
  Filled 2017-07-18: qty 5

## 2017-07-18 MED ORDER — 0.9 % SODIUM CHLORIDE (POUR BTL) OPTIME
TOPICAL | Status: DC | PRN
  Administered 2017-07-18 (×2): 1000 mL

## 2017-07-18 MED ORDER — CALCIUM CHLORIDE 10 % IV SOLN
INTRAVENOUS | Status: AC
  Filled 2017-07-18: qty 10

## 2017-07-18 MED ORDER — SUGAMMADEX SODIUM 200 MG/2ML IV SOLN
INTRAVENOUS | Status: DC | PRN
  Administered 2017-07-18: 125.2 mg via INTRAVENOUS

## 2017-07-18 MED ORDER — ARTIFICIAL TEARS OPHTHALMIC OINT
TOPICAL_OINTMENT | OPHTHALMIC | Status: AC
  Filled 2017-07-18: qty 3.5

## 2017-07-18 MED ORDER — PROPOFOL 1000 MG/100ML IV EMUL
INTRAVENOUS | Status: AC
  Filled 2017-07-18: qty 100

## 2017-07-18 MED ORDER — DIPHENHYDRAMINE HCL 50 MG/ML IJ SOLN: 12.5000 mg | Freq: Four times a day (QID) | INTRAMUSCULAR | Status: DC | PRN

## 2017-07-18 MED ORDER — SODIUM CHLORIDE 0.9 % IV SOLN
INTRAVENOUS | Status: DC | PRN
  Administered 2017-07-18: 18:00:00 via INTRAVENOUS
  Administered 2017-07-18: 15 ug/min via INTRAVENOUS

## 2017-07-18 MED ORDER — FAMOTIDINE IN NACL 20-0.9 MG/50ML-% IV SOLN
20.0000 mg | Freq: Two times a day (BID) | INTRAVENOUS | Status: DC
  Administered 2017-07-18 – 2017-07-22 (×8): 20 mg via INTRAVENOUS
  Filled 2017-07-18 (×8): qty 50

## 2017-07-18 MED ORDER — MEPERIDINE HCL 50 MG/ML IJ SOLN: 6.2500 mg | INTRAMUSCULAR | Status: DC | PRN

## 2017-07-18 MED ORDER — ROCURONIUM BROMIDE 10 MG/ML (PF) SYRINGE
PREFILLED_SYRINGE | INTRAVENOUS | Status: DC | PRN
  Administered 2017-07-18: 50 mg via INTRAVENOUS
  Administered 2017-07-18 (×2): 20 mg via INTRAVENOUS
  Administered 2017-07-18: 30 mg via INTRAVENOUS

## 2017-07-18 MED ORDER — NALOXONE HCL 0.4 MG/ML IJ SOLN: 0.4000 mg | INTRAMUSCULAR | Status: DC | PRN

## 2017-07-18 MED ORDER — SODIUM CHLORIDE 0.9% FLUSH: 9.0000 mL | INTRAVENOUS | Status: DC | PRN

## 2017-07-18 MED ORDER — EPHEDRINE SULFATE-NACL 50-0.9 MG/10ML-% IV SOSY
PREFILLED_SYRINGE | INTRAVENOUS | Status: DC | PRN
  Administered 2017-07-18: 10 mg via INTRAVENOUS
  Administered 2017-07-18: 5 mg via INTRAVENOUS
  Administered 2017-07-18: 10 mg via INTRAVENOUS
  Administered 2017-07-18: 5 mg via INTRAVENOUS
  Administered 2017-07-18: 10 mg via INTRAVENOUS

## 2017-07-18 MED ORDER — KCL IN DEXTROSE-NACL 20-5-0.9 MEQ/L-%-% IV SOLN
INTRAVENOUS | Status: AC
  Administered 2017-07-18 – 2017-07-22 (×12): via INTRAVENOUS
  Filled 2017-07-18 (×12): qty 1000

## 2017-07-18 MED ORDER — MORPHINE SULFATE (PF) 2 MG/ML IV SOLN
1.0000 mg | INTRAVENOUS | Status: DC | PRN
  Administered 2017-07-18: 2 mg via INTRAVENOUS
  Filled 2017-07-18: qty 1

## 2017-07-18 MED ORDER — SODIUM CHLORIDE 0.9 % IV SOLN
INTRAVENOUS | Status: DC | PRN
  Administered 2017-07-18: 17:00:00 via INTRAVENOUS

## 2017-07-18 MED ORDER — ONDANSETRON HCL 4 MG/2ML IJ SOLN
INTRAMUSCULAR | Status: DC | PRN
  Administered 2017-07-18: 4 mg via INTRAVENOUS

## 2017-07-18 SURGICAL SUPPLY — 86 items
ADH SKN CLS APL DERMABOND .7 (GAUZE/BANDAGES/DRESSINGS) ×3
BLADE CLIPPER SURG (BLADE) IMPLANT
BRUSH CYTOL CELLEBRITY 1.5X140 (MISCELLANEOUS) IMPLANT
CANISTER SUCT 3000ML PPV (MISCELLANEOUS) ×10 IMPLANT
CHLORAPREP W/TINT 26ML (MISCELLANEOUS) ×5 IMPLANT
CLIP VESOCCLUDE MED 6/CT (CLIP) ×4 IMPLANT
CONT SPEC 4OZ CLIKSEAL STRL BL (MISCELLANEOUS) ×10 IMPLANT
COVER BACK TABLE 60X90IN (DRAPES) ×5 IMPLANT
COVER SURGICAL LIGHT HANDLE (MISCELLANEOUS) ×5 IMPLANT
DECANTER SPIKE VIAL GLASS SM (MISCELLANEOUS) ×10 IMPLANT
DERMABOND ADVANCED (GAUZE/BANDAGES/DRESSINGS) ×2
DERMABOND ADVANCED .7 DNX12 (GAUZE/BANDAGES/DRESSINGS) ×3 IMPLANT
DISSECTOR BLUNT TIP ENDO 5MM (MISCELLANEOUS) ×2 IMPLANT
DRAIN CHANNEL 19F RND (DRAIN) ×2 IMPLANT
DRAPE HALF SHEET 40X57 (DRAPES) ×2 IMPLANT
DRAPE LAPAROSCOPIC ABDOMINAL (DRAPES) ×5 IMPLANT
DRAPE WARM FLUID 44X44 (DRAPE) ×5 IMPLANT
DRSG OPSITE POSTOP 4X10 (GAUZE/BANDAGES/DRESSINGS) IMPLANT
DRSG OPSITE POSTOP 4X8 (GAUZE/BANDAGES/DRESSINGS) IMPLANT
DRSG PAD ABDOMINAL 8X10 ST (GAUZE/BANDAGES/DRESSINGS) ×2 IMPLANT
ELECT BLADE 6.5 EXT (BLADE) IMPLANT
ELECT CAUTERY BLADE 6.4 (BLADE) ×5 IMPLANT
ELECT REM PT RETURN 9FT ADLT (ELECTROSURGICAL) ×5
ELECTRODE REM PT RTRN 9FT ADLT (ELECTROSURGICAL) ×3 IMPLANT
EVACUATOR SILICONE 100CC (DRAIN) ×2 IMPLANT
FILTER STRAW FLUID ASPIR (MISCELLANEOUS) IMPLANT
FORCEPS BIOP RJ4 1.8 (CUTTING FORCEPS) IMPLANT
FORCEPS RADIAL JAW LRG 4 PULM (INSTRUMENTS) IMPLANT
GAUZE SPONGE 4X4 12PLY STRL (GAUZE/BANDAGES/DRESSINGS) ×5 IMPLANT
GLOVE BIO SURGEON STRL SZ7.5 (GLOVE) ×10 IMPLANT
GLOVE BIOGEL M STRL SZ7.5 (GLOVE) ×5 IMPLANT
GLOVE BIOGEL PI IND STRL 8 (GLOVE) ×6 IMPLANT
GLOVE BIOGEL PI INDICATOR 8 (GLOVE) ×4
GOWN STRL REUS W/ TWL LRG LVL3 (GOWN DISPOSABLE) ×12 IMPLANT
GOWN STRL REUS W/ TWL XL LVL3 (GOWN DISPOSABLE) ×3 IMPLANT
GOWN STRL REUS W/TWL 2XL LVL3 (GOWN DISPOSABLE) ×5 IMPLANT
GOWN STRL REUS W/TWL LRG LVL3 (GOWN DISPOSABLE) ×20
GOWN STRL REUS W/TWL XL LVL3 (GOWN DISPOSABLE) ×5
HEMOSTAT SNOW SURGICEL 2X4 (HEMOSTASIS) ×4 IMPLANT
KIT BASIN OR (CUSTOM PROCEDURE TRAY) ×5 IMPLANT
KIT CLEAN ENDO COMPLIANCE (KITS) ×7 IMPLANT
KIT TURNOVER KIT B (KITS) ×10 IMPLANT
LIGASURE IMPACT 36 18CM CVD LR (INSTRUMENTS) ×2 IMPLANT
MARKER SKIN DUAL TIP RULER LAB (MISCELLANEOUS) ×5 IMPLANT
NS IRRIG 1000ML POUR BTL (IV SOLUTION) ×17 IMPLANT
OIL SILICONE PENTAX (PARTS (SERVICE/REPAIRS)) ×7 IMPLANT
PACK GENERAL/GYN (CUSTOM PROCEDURE TRAY) ×5 IMPLANT
PAD ARMBOARD 7.5X6 YLW CONV (MISCELLANEOUS) ×20 IMPLANT
RADIAL JAW LRG 4 PULMONARY (INSTRUMENTS)
RELOAD PROXIMATE 75MM BLUE (ENDOMECHANICALS) ×5 IMPLANT
RELOAD STAPLE 75 3.8 BLU REG (ENDOMECHANICALS) IMPLANT
SCISSORS LAP 5X35 DISP (ENDOMECHANICALS) ×2 IMPLANT
SET IRRIG TUBING LAPAROSCOPIC (IRRIGATION / IRRIGATOR) IMPLANT
SHEARS HARMONIC ACE PLUS 36CM (ENDOMECHANICALS) IMPLANT
SLEEVE ENDOPATH XCEL 5M (ENDOMECHANICALS) ×5 IMPLANT
SPECIMEN JAR LARGE (MISCELLANEOUS) IMPLANT
SPONGE LAP 18X18 X RAY DECT (DISPOSABLE) IMPLANT
STAPLER GUN LINEAR PROX 60 (STAPLE) ×2 IMPLANT
STAPLER PROXIMATE 75MM BLUE (STAPLE) ×2 IMPLANT
STAPLER VISISTAT 35W (STAPLE) ×7 IMPLANT
SUCTION POOLE TIP (SUCTIONS) ×5 IMPLANT
SUT ETHILON 2 0 FS 18 (SUTURE) ×2 IMPLANT
SUT MNCRL AB 4-0 PS2 18 (SUTURE) ×5 IMPLANT
SUT PDS AB 1 TP1 96 (SUTURE) ×10 IMPLANT
SUT SILK 2 0 SH CR/8 (SUTURE) ×5 IMPLANT
SUT SILK 2 0 TIES 10X30 (SUTURE) ×5 IMPLANT
SUT SILK 3 0 SH CR/8 (SUTURE) ×7 IMPLANT
SUT SILK 3 0 TIES 10X30 (SUTURE) ×5 IMPLANT
SUT VIC AB 3-0 SH 18 (SUTURE) IMPLANT
SYR 20ML ECCENTRIC (SYRINGE) ×10 IMPLANT
SYR 5ML LL (SYRINGE) ×5 IMPLANT
SYR 5ML LUER SLIP (SYRINGE) ×5 IMPLANT
TAPE CLOTH SURG 4X10 WHT LF (GAUZE/BANDAGES/DRESSINGS) ×2 IMPLANT
TOWEL OR 17X24 6PK STRL BLUE (TOWEL DISPOSABLE) ×5 IMPLANT
TOWEL OR 17X26 10 PK STRL BLUE (TOWEL DISPOSABLE) ×5 IMPLANT
TRAP SPECIMEN MUCOUS 40CC (MISCELLANEOUS) ×7 IMPLANT
TRAY FOLEY MTR SLVR 16FR STAT (SET/KITS/TRAYS/PACK) IMPLANT
TRAY LAPAROSCOPIC MC (CUSTOM PROCEDURE TRAY) ×5 IMPLANT
TROCAR XCEL BLUNT TIP 100MML (ENDOMECHANICALS) IMPLANT
TROCAR XCEL NON-BLD 11X100MML (ENDOMECHANICALS) IMPLANT
TROCAR XCEL NON-BLD 5MMX100MML (ENDOMECHANICALS) ×5 IMPLANT
TUBE CONNECTING 20'X1/4 (TUBING) ×2
TUBE CONNECTING 20X1/4 (TUBING) ×5 IMPLANT
TUBING INSUFFLATION (TUBING) ×5 IMPLANT
VALVE DISPOSABLE (MISCELLANEOUS) ×7 IMPLANT
YANKAUER SUCT BULB TIP NO VENT (SUCTIONS) IMPLANT

## 2017-07-18 NOTE — Progress Notes (Signed)
Physical Therapy Treatment Patient Details Name: Gerald Farrell MRN: [id number] DOB: [birth year] Today's Date: 07/18/2017    History of Present Illness [name] with history of hypertension, tobacco use and recent melanoma removal status post laparoscopic partial cholecystectomy April 29, 2017 for gangrenous cholecystitis was transferred from [facility] for abdominal pain, small bowel obstruction and hematemesis.    PT Comments    Patient with cont abdominal pain and R sided chest pain, ambulating 500' this visit with supervision and use of RW. No LOB but pt reporting feeling weaker than baseline. VSS on RA, encouraged mobility throughout day with nursing/mobility tech and IS use.     Follow Up Recommendations  No PT follow up;Supervision for mobility/OOB     Equipment Recommendations  None recommended by PT    Recommendations for Other Services       Precautions / Restrictions Restrictions Weight Bearing Restrictions: No    Mobility  Bed Mobility Overal bed mobility: Modified Independent                Transfers Overall transfer level: Modified independent Equipment used: None                Ambulation/Gait Ambulation/Gait assistance: Supervision Gait Distance (Feet): 500 Feet Assistive device: Rolling walker (2 wheeled) Gait Pattern/deviations: Step-through pattern Gait velocity: decreased   General Gait Details: supervision for ambulation pt ambulating with flexed posture on RW, abdominal pain. VSS after 500' walking.    Stairs             Wheelchair Mobility    Modified Rankin (Stroke Patients Only)       Balance Overall balance assessment: Mild deficits observed, not formally tested                                          Cognition Arousal/Alertness: Awake/alert Behavior During Therapy: WFL for tasks assessed/performed Overall Cognitive Status: Within Functional Limits for  tasks assessed                                        Exercises      General Comments        Pertinent Vitals/Pain Pain Assessment: Faces Faces Pain Scale: Hurts even more Pain Location: adomen, R side of chest Pain Descriptors / Indicators: Discomfort Pain Intervention(s): Monitored during session;Limited activity within patient's tolerance;Repositioned    Home Living                      Prior Function            PT Goals (current goals can now be found in the care plan section) Acute Rehab PT Goals Patient Stated Goal: feel better and get back to yoga PT Goal Formulation: With patient Time For Goal Achievement: 07/30/17 Potential to Achieve Goals: Fair Progress towards PT goals: Progressing toward goals    Frequency    Min 3X/week      PT Plan Current plan remains appropriate    Co-evaluation              AM-PAC PT "6 Clicks" Daily Activity  Outcome Measure  Difficulty turning over in bed (including adjusting bedclothes, sheets and blankets)?: None Difficulty moving from lying on back to sitting on the side  of the bed? : None Difficulty sitting down on and standing up from a chair with arms (e.g., wheelchair, bedside commode, etc,.)?: None Help needed moving to and from a bed to chair (including a wheelchair)?: None Help needed walking in hospital room?: None Help needed climbing 3-5 steps with a railing? : A Little 6 Click Score: 23    End of Session Equipment Utilized During Treatment: Gait belt Activity Tolerance: Patient tolerated treatment well Patient left: in bed;with call bell/phone within reach;with SCD's reapplied Nurse Communication: Mobility status PT Visit Diagnosis: Other abnormalities of gait and mobility (R26.89);Muscle weakness (generalized) (M62.81)     Time: 1000-1020 PT Time Calculation (min) (ACUTE ONLY): 20 min  Charges:  $Gait Training: 8-22 mins                    G Codes:       Reinaldo Berber, PT, DPT Acute Rehab Services Pager: [phone]     Reinaldo Berber 07/18/2017, 10:22 AM

## 2017-07-18 NOTE — Progress Notes (Signed)
Patient ID: Gerald Farrell, male   DOB: [birth year], 72 y.o.   MRN: [id number]   Acute Care Surgery Service Progress Note:    Chief Complaint/Subjective: No emesis Slept well Mild intermittent abd discomfort  Objective: Vital signs in last 24 hours: Temp:  [97.3 F (36.3 C)-98 F (36.7 C)] 97.3 F (36.3 C) (07/18 0522) Pulse Rate:  [67-75] 67 (07/18 0522) Resp:  [17] 17 (07/17 2109) BP: (122-146)/(56-88) 122/56 (07/18 0522) SpO2:  [98 %-100 %] 98 % (07/18 0522) Last BM Date: 07/17/17  Intake/Output from previous day: 07/17 0701 - 07/18 0700 In: 140.4 [P.O.:60; IV Piggyback:80.4] Out: 1500 [Urine:1500] Intake/Output this shift: No intake/output data recorded.  Lungs: cta, nonlabored  Cardiovascular: reg  Abd: soft, nt, not really distended  Extremities: no edema, +SCDs  Neuro: alert, nonfocal  Lab Results: CBC  Recent Labs    07/17/17 0523 07/18/17 0514  WBC 9.1 9.8  HGB 10.6* 9.8*  HCT 32.8* 30.4*  PLT 388 371   BMET Recent Labs    07/17/17 0523 07/18/17 0514  NA 136 135  K 4.0 3.9  CL 103 104  CO2 27 24  GLUCOSE 102* 101*  BUN <5* <5*  CREATININE 0.50* 0.58*  CALCIUM 8.5* 8.3*   LFT Hepatic Function Latest Ref Rng & Units 07/14/2017 06/19/2017 05/02/2017  Total Protein 6.5 - 8.1 g/dL 5.7(L) 6.4 5.3(L)  Albumin 3.5 - 5.0 g/dL 1.9(L) 3.4(L) 2.1(L)  AST 15 - 41 U/L 43(H) 12 21  ALT 0 - 44 U/L '30 8 24  '$ Alk Phosphatase 38 - 126 U/L 81 84 173(H)  Total Bilirubin 0.3 - 1.2 mg/dL 0.9 0.4 0.7  Bilirubin, Direct 0.0 - 0.2 mg/dL 0.3(H) - -   PT/INR No results for input(s): LABPROT, INR in the last 72 hours. ABG No results for input(s): PHART, HCO3 in the last 72 hours.  Invalid input(s): PCO2, PO2  Studies/Results:  Anti-infectives: Anti-infectives (From admission, onward)   Start     Dose/Rate Route Frequency Ordered Stop   07/14/17 0900  piperacillin-tazobactam (ZOSYN) IVPB 3.375 g     3.375 g 12.5 mL/hr over 240 Minutes Intravenous Every 8  hours 07/14/17 0818        Medications: Scheduled Meds: . amLODipine  5 mg Oral Daily  . carvedilol  25 mg Oral BID WC  . cloNIDine  0.1 mg Oral TID  . enoxaparin (LOVENOX) injection  30 mg Subcutaneous Q24H  . gabapentin  800 mg Oral TID  . hydrochlorothiazide  25 mg Oral Daily  . lisinopril  40 mg Oral BID  . nicotine  14 mg Transdermal Daily  . pantoprazole  40 mg Oral Q0600  . tamsulosin  0.4 mg Oral Daily   Continuous Infusions: . dextrose 5 % and 0.9 % NaCl with KCl 20 mEq/L 75 mL/hr at 07/18/17 0607  . piperacillin-tazobactam (ZOSYN)  IV 3.375 g (07/18/17 0833)   PRN Meds:.acetaminophen, morphine injection, ondansetron **OR** ondansetron (ZOFRAN) IV, oxyCODONE, zolpidem  Assessment/Plan: Patient Active Problem List   Diagnosis Date Noted  . Small bowel obstruction ([place]) 07/14/2017  . Abnormal cholangiogram   . Bile duct leak   Hypertension- severe on multiple agents Hypokalemia-resolved K+ 4.0 Hypomagnesemia- Mg 1.8, resolved 7/16 Chronic anemia- Hg 10.9 from 9.2, stable Tobacco use- nicotine patch Chronic pain Right hilum/right upper lobe mass/masslike consolidation -CXR 7/16  -CT of the chest with contrast 7/17 - focal consolidation RUL 6.9 x 5cm c/w bacterial PNA with possible early lung abscess -CCM/pulm consult 7/17 - abx  and bronchoscopy 7/18  Small bowel obstruction - XR 7/14 contrast in right colon - tolerating liquids and having bowel function  -Repeat CT 7/17 - showed progressive SBO  S/P laparoscopic partial cholecystectomy 04/29/2017, Dr. Ralene Ok Bile leak with postop stent placement and stent removal, Dr.CarlGessner  Fen: IV fluids/NPO ID: Zosyn 7/14=>>day4 DVT: Lovenox Follow-up: TBD   Disposition: To OR later today for diagnostic laparoscopy possible exploratory laparotomy possible bowel resection for persistent SBO.  Pulmonary is planning to do bronchoscopy while he is under general anesthesia.  Patient seen this  morning.  All his questions were asked and answered.  Extensive conversation with him and his sister yesterday afternoon.-  LOS: 4 days    Leighton Ruff. Redmond Pulling, MD, FACS General, Bariatric, & Minimally Invasive Surgery [phone] [facility]

## 2017-07-18 NOTE — Op Note (Signed)
Video Bronchoscopy Procedure Note  Date of Operation: 07/18/2017  Pre-op Diagnosis: Right upper lobe rounded opacity  Post-op Diagnosis: Same  Surgeon: Baltazar Apo  Assistants: none  Anesthesia: General anesthesia  Operation: Flexible video fiberoptic bronchoscopy and biopsies.  Estimated Blood Loss:  none   Complications: none noted  Indications and History: Gerald Farrell is 72 y.o. with history of a comp gated recent GI history that included acute cholecystitis now admitted with a small bowel obstruction.  He is a longtime smoker and his imaging has revealed a rounded right upper lobe opacity of unclear etiology..  Recommendation was to perform video fiberoptic bronchoscopy with BAL. The risks, benefits, complications, treatment options and expected outcomes were discussed with the patient.  The possibilities of pneumothorax, pneumonia, reaction to medication, pulmonary aspiration, perforation of a viscus, bleeding, failure to diagnose a condition and creating a complication requiring transfusion or operation were discussed with the patient who freely signed the consent.    Description of Procedure: The patient was seen in the Preoperative Area, was examined and was deemed appropriate to proceed.  The patient was taken to OR 1 and general anesthesia was induced.  He was identified as Gerald Farrell and the procedure verified as Flexible Video Fiberoptic Bronchoscopy.  A Time Out was held and the above information confirmed.   The video fiberoptic bronchoscope was introduced via the endotracheal tube and a general inspection was performed which showed normal trachea, normal main carina. The R sided airways were inspected and showed normal BI, RML and RLL.  There was purulent mucus emanating from the anterior segment of the right upper lobe.  No endobronchial lesions were seen.  The L side was then inspected. The LLL, Lingular and LUL airways were normal.   Bronchoalveolar lavage was  performed in the anterior segment of the right upper lobe to be sent for cytology, bacterial culture, AFB culture, fungal culture. The patient tolerated the procedure well. The bronchoscope was removed. There were no obvious complications.   Samples: 1.  Bronchoalveolar lavage from the anterior segment of the right upper lobe  Plans:  We will review the cytology, pathology and microbiology results with the patient when they become available.   Baltazar Apo, MD, PhD 07/18/2017, 2:51 PM [facility] [phone] or if no answer [phone]

## 2017-07-18 NOTE — Interval H&P Note (Signed)
PCCM Interval Note  Mr Gerstenberger has an unexplained RUL rounded opacity in the anterior segment. Suspicious for PNA, but must also consider other causes including malignancy.   He is going for laparotomy under general anesthesia today and we have arranged to perform BAL while he is intubated to assist with diagnosis of the RUL process. Appreciate Dr Dois Davenport help facilitating this.   Procedure discussed with the patient - he understands the benefits, risks. He agrees to proceed. No barriers identified.   Baltazar Apo, MD, PhD 07/18/2017, 2:15 PM [facility] [phone] or if no answer [phone]

## 2017-07-18 NOTE — Anesthesia Procedure Notes (Signed)
Procedure Name: Intubation Date/Time: 07/18/2017 2:34 PM Performed by: Colin Benton, CRNA Pre-anesthesia Checklist: Patient identified, Emergency Drugs available, Suction available and Patient being monitored Patient Re-evaluated:Patient Re-evaluated prior to induction Oxygen Delivery Method: Circle system utilized Preoxygenation: Pre-oxygenation with 100% oxygen Induction Type: IV induction Ventilation: Mask ventilation without difficulty Laryngoscope Size: Miller and 2 Grade View: Grade I Tube type: Oral Tube size: 8.5 mm Number of attempts: 1 Airway Equipment and Method: Stylet Placement Confirmation: ETT inserted through vocal cords under direct vision,  breath sounds checked- equal and bilateral and positive ETCO2 Secured at: 23 cm Tube secured with: Tape Dental Injury: Teeth and Oropharynx as per pre-operative assessment

## 2017-07-18 NOTE — Transfer of Care (Signed)
Immediate Anesthesia Transfer of Care Note  Patient: Gerald Farrell  Procedure(s) Performed: LAPAROSCOPY DIAGNOSTIC, lysis of adhesions (N/A Abdomen) EXPLORATORY LAPAROTOMY, COMPLETION OF CHOLECYSTECTOMY, REPAIR OF SMALL BOWEL CIRRHOSIS, SMALL BOWEL RESECTION. (Abdomen) VIDEO BRONCHOSCOPY (N/A )  Patient Location: PACU  Anesthesia Type:General  Level of Consciousness: awake, alert , oriented and patient cooperative  Airway & Oxygen Therapy: Patient Spontanous Breathing and Patient connected to face mask oxygen  Post-op Assessment: Report given to RN and Post -op Vital signs reviewed and stable  Post vital signs: Reviewed and stable  Last Vitals:  Vitals Value Taken Time  BP 145/77 07/18/2017  6:17 PM  Temp    Pulse 70 07/18/2017  6:20 PM  Resp 20 07/18/2017  6:20 PM  SpO2 100 % 07/18/2017  6:20 PM  Vitals shown include unvalidated device data.  Last Pain:  Vitals:   07/18/17 0828  TempSrc:   PainSc: 0-No pain         Complications: No apparent anesthesia complications

## 2017-07-18 NOTE — Anesthesia Preprocedure Evaluation (Signed)
Anesthesia Evaluation  Patient identified by MRN, date of birth, ID band Patient awake    Reviewed: Allergy & Precautions, NPO status , Patient's Chart, lab work & pertinent test results  Airway Mallampati: I  TM Distance: >3 FB Neck ROM: Full    Dental   Pulmonary Current Smoker,    Pulmonary exam normal        Cardiovascular hypertension, Pt. on medications Normal cardiovascular exam     Neuro/Psych    GI/Hepatic   Endo/Other    Renal/GU      Musculoskeletal   Abdominal   Peds  Hematology   Anesthesia Other Findings   Reproductive/Obstetrics                             Anesthesia Physical Anesthesia Plan  ASA: III  Anesthesia Plan: General   Post-op Pain Management:    Induction: Intravenous  PONV Risk Score and Plan: 1 and Ondansetron  Airway Management Planned: Oral ETT  Additional Equipment:   Intra-op Plan:   Post-operative Plan: Extubation in OR  Informed Consent: I have reviewed the patients History and Physical, chart, labs and discussed the procedure including the risks, benefits and alternatives for the proposed anesthesia with the patient or authorized representative who has indicated his/her understanding and acceptance.       Plan Discussed with: CRNA and Surgeon  Anesthesia Plan Comments:         Anesthesia Quick Evaluation  

## 2017-07-18 NOTE — Anesthesia Postprocedure Evaluation (Signed)
Anesthesia Post Note  Patient: Gerald Farrell  Procedure(s) Performed: LAPAROSCOPY DIAGNOSTIC, lysis of adhesions (N/A Abdomen) EXPLORATORY LAPAROTOMY, COMPLETION OF CHOLECYSTECTOMY, REPAIR OF SMALL BOWEL CIRRHOSIS, SMALL BOWEL RESECTION. (Abdomen) VIDEO BRONCHOSCOPY (N/A )     Patient location during evaluation: PACU Anesthesia Type: General Level of consciousness: awake and alert Pain management: pain level controlled Vital Signs Assessment: post-procedure vital signs reviewed and stable Respiratory status: spontaneous breathing, nonlabored ventilation, respiratory function stable and patient connected to nasal cannula oxygen Cardiovascular status: blood pressure returned to baseline and stable Postop Assessment: no apparent nausea or vomiting Anesthetic complications: no    Last Vitals:  Vitals:   07/18/17 1817 07/18/17 1832  BP: (!) 145/77 (!) 143/77  Pulse: 72 63  Resp: 18 14  Temp: (!) 36.4 C   SpO2: 100% 97%    Last Pain:  Vitals:   07/18/17 1845  TempSrc:   PainSc: 8                  Darvin Dials DAVID

## 2017-07-18 NOTE — Brief Op Note (Signed)
07/18/2017  7:05 PM  PATIENT:  Gerald Farrell  72 y.o. male  PRE-OPERATIVE DIAGNOSIS:  SMALL BOWEL OBSTRUCTION  POST-OPERATIVE DIAGNOSIS:  SMALL BOWEL OBSTRUCTION SECONDARY TO INTERNAL HERNIA; TOTAL OF THREE INTERNAL HERNIAS, SMALL BOWEL ENTEROTOMY, CHRONIC CHOLECYSTITIS; LUNG ABSCESS  PROCEDURE:  Procedure(s): LAPAROSCOPY DIAGNOSTIC, lysis of adhesions  X 45 MIN(N/A) VIDEO BRONCHOSCOPY (N/A) - Dr Lamonte Sakai EXPLORATORY LAPAROTOMY, COMPLETION OF CHOLECYSTECTOMY, REPAIR OF SMALL BOWEL SEROSAL TEAR, SMALL BOWEL RESECTION.  SURGEON:  Surgeon(s) and Role: Panel 1:    * Greer Pickerel, MD - Primary    * Judeth Horn, MD - Assisting Panel 2:    * Collene Gobble, MD - Primary - BRONCHOSCOPY  PHYSICIAN ASSISTANT: Saverio Danker PA-C  ASSISTANTS: see above   ANESTHESIA:   general  EBL:  300 mL   BLOOD ADMINISTERED:2units CC PRBC  DRAINS: (18) Jackson-Pratt drain(s) with closed bulb suction in the RUQ, Nasogastric Tube and Urinary Catheter (Foley)   LOCAL MEDICATIONS USED:  MARCAINE     SPECIMEN:  Source of Specimen:  small bowel  DISPOSITION OF SPECIMEN:  PATHOLOGY  COUNTS:  YES  TOURNIQUET:  * No tourniquets in log *  DICTATION: .Other Dictation: Dictation Number [id number]  PLAN OF CARE: INPT  PATIENT DISPOSITION:  ICU - extubated and stable.   Delay start of Pharmacological VTE agent (>24hrs) due to surgical blood loss or risk of bleeding: no  Leighton Ruff. Redmond Pulling, MD, FACS General, Bariatric, & Minimally Invasive Surgery [facility] [place]

## 2017-07-18 NOTE — Op Note (Signed)
NAME: ESSAM, LOWDERMILK MEDICAL RECORD OP:[id number] ACCOUNT [id number] DATE OF BIRTH:11-Apr-1945 FACILITY: [facility] LOCATION: [facility] PHYSICIAN:Sully Manzi Ronnie Derby, MD  OPERATIVE REPORT  DATE OF PROCEDURE:  07/18/2017  PREOPERATIVE DIAGNOSES:   1.  Partial small-bowel obstruction. 2.  History of laparoscopic partial cholecystectomy April 2019.  POSTOPERATIVE DIAGNOSES: 1.  Partial small-bowel obstruction secondary to internal hernia. 2.  Additional internal hernias x2. 3.  Chronic cholecystitis. 4.  Small bowel enterotomy. 5.  Small bowel serosal tear.  PROCEDURE: 1.  Laparoscopic lysis of adhesions. 2.  Conversion to exploratory laparotomy with small bowel resection and primary repair of small bowel serosal tear. 3.  Completion of prior partial cholecystectomy.  SURGEON:  Leighton Ruff. Redmond Pulling, MD  ASSISTANT:  Judeth Horn, MD, Saverio Danker, PA-C  ANESTHESIA:  General.  ESTIMATED BLOOD LOSS:  200 mL.  SPECIMEN:   1.  Section of mid small bowel. 2.  Gallbladder.  BLOOD PRODUCTS:  2 units of PRBCs.  INDICATIONS:  The patient is a 72 year old gentleman who underwent a laparoscopic partial cholecystectomy in April of this year for gangrenous cholecystitis.  He had a postoperative bile leak, which was treated with a common bile duct stent, which was  subsequently removed in late June of this year.  He stated that he had ongoing intermittent crampy abdominal pain.  He went to outside hospital this past weekend where he was found to have electrolyte derangements along with a leukocytosis.  A CT scan at  that facility showed a partial small-bowel obstruction secondary to an internal hernia and a large remnant of his gallbladder.  He was transferred here for care.  His abdominal exam was benign.  He was no longer having emesis and having flatus.  He was  started on a small-bowel obstruction protocol.  There was no evidence of bowel wall thickening, pneumatosis or portal venous gas.  His abdominal  exam was reassuring, so I initially started with nonoperative management with our small-bowel obstruction  protocol.  Contrast ended up in his colon and he has started having bowel movements, never requiring nasogastric tube decompression.  He was started on his diet and has advanced to regular, but he still continued to have some mild intermittent crampy  abdominal pain.  We reviewed his outside imaging again and it was discovered that he had an area of consolidation noticed on his outside CT scan of his abdomen and pelvis in his right lung.  A formal chest x-ray was performed which demonstrated  consolidation in the right lung.  Therefore, we ordered a CT scan of his chest and decided to proceed with a repeat of his abdomen and pelvis since he was still having some mild crampy abdominal pain.  He was found to have a large area of consolidation  with a probable abscess in his lung as well as progression of his partial small-bowel obstruction appearing to be due to an internal hernia.  Based on this, pulmonary critical care medicine was consulted and they recommended continuing IV antibiotics for  his bronchopneumonia and probable lung abscess.  Because he had worsening evidence of small-bowel obstruction and some ongoing abdominal discomfort, I recommended diagnostic laparoscopy with possible exploratory laparotomy.  I discussed at length the  risks and benefits of the procedure with him and his sister the day prior to surgery including but not limited to bleeding, infection, injury to surrounding structures, need to convert to an open procedure, possible bowel resection, incisional hernia,  wound infection, blood clot formation, perioperative cardiac and pulmonary events,  prolonged hospitalization as well as the typical recovery.  OPERATIVE FINDINGS:  He had essentially 3 internal hernias of various types.  Most were intraloop adhesions.  One of the intraloop adhesions was the definitive source of his  current partial small-bowel obstruction, but he had 2 other impressive internal  hernias that I felt needed to be taken down in order to prevent future problems.  One of them was intimately adhered to the right lobe of the liver and the remnant gallbladder, which was quite large.  In taking down the adhesions between the loop of  small bowel adhered to the liver and the remnant gallbladder, the gallbladder was entered so that at that point, I decided to convert to an open procedure since we would need to do a completion cholecystectomy since the gallbladder had been entered.   Upon opening the abdomen, I discovered that there had been a small bowel enterotomy which was an expected occurrence during this type of procedure.  After obtaining informed consent, the patient was taken to OR 1 at [facility], placed supine on the operating room table.  General endotracheal anesthesia was established.  Sequential compression devices were placed.  Foley catheter was placed.   His left arm was tucked at his side with the appropriate padding.  Prior to me starting my portion of the procedure, pulmonary critical medicine came in and performed a bronchoscopy and visualized purulence in the lung.  Please see their operative note for further details.  At this point, his abdomen was prepped and  draped in the usual standard surgical fashion.  I gained access to his abdomen via Hasson technique.  I incised through the old infraumbilical incision with a #11 blade.  The fascia was incised.  The abdominal cavity was entered.  A pursestring suture  was placed around the fascial edges and a 12 mm Hasson trocar was placed and pneumoperitoneum was smoothly established up to a patient pressure of 15 mmHg.  The laparoscope was advanced.  There was no evidence of injury to surrounding structures.  The  right upper quadrant was visualized and there were 2 loops of small bowel.  One was tethered to the falciform ligament and  another appeared to be tethered to the right lobe of the liver and what appeared to be the remnant gallbladder.  I placed 2  additional 5 mm trocars, one in the left upper quadrant and one in the left lower quadrant.  I then began carefully lysing the adhesive band causing the point of obstruction.  This was actually 3 loops of small bowel tethered by the adhesive band coming  from the sigmoid colon.  He had a very long mesentery and redundant sigmoid colon and the adhesive epiploic appendage was tethered to a section of the small bowel with another loop of small bowel tethered at the same point.  I was able to lyse this  adhesion with EndoShears.  I then started running the small bowel with atraumatic bowel graspers then came across another intraloop adhesion with small bowel underneath it and this was lysed.  I got all the way to the terminal ileum and cecum.  I  identified the appendix and started running the bowel back proximally.  I identified the area in the small bowel where I took down the dense adhesive band that had been the focal point of the current obstruction and there appeared to be a superficial  serosal tear in that section of the small bowel which I was  going to come back and repair after I finished running the rest of the bowel.  This serosal tear was an unexpected occurrence given the anticipated findings during surgery.  There was still a  loop of small bowel tethered to the right lobe of the liver and the remnant gallbladder.  There was a loop of small bowel going behind this, which I felt was just an imminent internal hernia waiting to happen, so therefore, I felt that I needed to free  this loop of small bowel from the right edge of the liver and the dome and the top of the remaining gallbladder.  This was densely adhered to the right edge of the liver as well as to the remnant gallbladder.  I ended up getting into the gallbladder and  there was evidence of some bile spillage.  At  this point, I knew that we would have to do a completion cholecystectomy so therefore I decided to convert to an open procedure.  Laparoscopic equipment was removed.  I made an incision from the xiphoid down  to the umbilicus.  This section of small bowel that had been tethered to the right edge of the liver and gallbladder had been freed, but there was a small bowel enterotomy in this area.  At this time, I resected that short segment of small bowel, which  was approximately 3-4 inches in length.  A small rent was made in the mesentery and a GIA-75 stapler was used to staple across the bowel proximally.  In a similar fashion, another defect was made in the mesentery just next to the bowel lumen and another  load of the GIA-75 stapler with a blue load was fired and the mesentery was taken down in sequential fashion with LigaSure.  The section of small bowel was passed off the field.  Prior to performing the anastomosis, I wanted to do the cholecystectomy.  I  did go ahead and repair the small bowel serosal tear in the distal small bowel with several interrupted 3-0 silk sutures in a Lembert fashion.  There was no evidence of stricture.  There is no evidence of leakage of enteric contents.  At this point, Dr.  Hulen Skains had joined me in the operating room.  We decided to do a completion cholecystectomy.  A Claiborne Billings was placed on the top of the gallbladder.  There was no induration around the infundibulum.  Using a right angle, I mobilized the peritoneum on both  sides of the gallbladder around the infundibulum with electrocautery.  I identified the cystic duct.  Three clips were placed proximally and one distally, then transected with Truett Mainland.  The cystic artery was identified and clipped as well.  We then took the  gallbladder out of the gallbladder fossa using Bovie electrocautery.  The posterior wall of the gallbladder was densely adhered to the liver.  The liver capsule was entered.  The gallbladder was removed.  I  did visualize the previously placed Endoloop  and the specimen from his prior partial cholecystectomy.  Cautery was increased and the gallbladder fossa was cauterized.  The right upper quadrant was irrigated.  There was some bile staining on gauze.  We reirrigated the right upper quadrant and there  is no evidence of bleeding.  I did not appreciate any additional bowel staining.  A piece of surgical SNoW was placed in the gallbladder fossa.  At this time, I performed a small bowel anastomosis.  The two ends of the stapled small bowel were aligned  in  a side-to-side fashion.  An enterotomy was made just in front of this each staple line.  One limb of a GIA stapler was placed through each enterotomy and brought together and fired to create a common channel.  The common defect was closed with a single  fire of a TA 60 stapler with a blue load.  The mesenteric defect was closed with figure-of-eight 2-0 silk sutures.  A 3-0 silk suture was placed in the crotch of the anastomosis.  The anastomosis was patent.  There is no evidence of stricture.  It was  viable.  We reirrigated the abdomen.  I placed an additional piece of SNoW in the gallbladder fossa.  The small bowel was ran again and there was no additional evidence of serosal tear or enterotomy.  The bowel was returned to the abdomen.  I did place a  19-French round drain in the right upper quadrant in the gallbladder fossa and secured to the skin with a 2-0 nylon suture.  The fascia was closed with a running looped PDS, one from above and one from below.  The trocar sites were closed with skin  staples.  The subcutaneous tissue and the skin were left open and packed with wet gauze.  The patient was extubated and taken to the ICU in stable condition.  TN/NUANCE  D:07/18/2017 T:07/18/2017 JOB:[id number]

## 2017-07-19 ENCOUNTER — Encounter (HOSPITAL_COMMUNITY): Payer: Self-pay | Admitting: General Surgery

## 2017-07-19 LAB — TYPE AND SCREEN
ABO/RH(D): O POS
Antibody Screen: NEGATIVE
UNIT DIVISION: 0
Unit division: 0

## 2017-07-19 LAB — BPAM RBC
BLOOD PRODUCT EXPIRATION DATE: 201908112359
Blood Product Expiration Date: 201907182359
ISSUE DATE / TIME: 201907181712
ISSUE DATE / TIME: 201907181712
UNIT TYPE AND RH: 5100
Unit Type and Rh: 9500

## 2017-07-19 LAB — BASIC METABOLIC PANEL
Anion gap: 5 (ref 5–15)
BUN: 6 mg/dL — AB (ref 8–23)
CHLORIDE: 107 mmol/L (ref 98–111)
CO2: 23 mmol/L (ref 22–32)
CREATININE: 0.42 mg/dL — AB (ref 0.61–1.24)
Calcium: 7.9 mg/dL — ABNORMAL LOW (ref 8.9–10.3)
GFR calc Af Amer: >60 mL/min (ref >60)
GFR calc non Af Amer: >60 mL/min (ref >60)
Glucose, Bld: 120 mg/dL — ABNORMAL HIGH (ref 70–99)
Potassium: 3.8 mmol/L (ref 3.5–5.1)
Sodium: 135 mmol/L (ref 135–145)

## 2017-07-19 LAB — CBC
HEMATOCRIT: 39.1 % (ref 39.0–52.0)
Hemoglobin: 12.9 g/dL — ABNORMAL LOW (ref 13.0–17.0)
MCH: 28.7 pg (ref 26.0–34.0)
MCHC: 33 g/dL (ref 30.0–36.0)
MCV: 87.1 fL (ref 78.0–100.0)
Platelets: 304 10*3/uL (ref 150–400)
RBC: 4.49 MIL/uL (ref 4.22–5.81)
RDW: 14.1 % (ref 11.5–15.5)
WBC: 20.1 10*3/uL — ABNORMAL HIGH (ref 4.0–10.5)

## 2017-07-19 LAB — ACID FAST SMEAR (AFB)

## 2017-07-19 LAB — MAGNESIUM: Magnesium: 1.3 mg/dL — ABNORMAL LOW (ref 1.7–2.4)

## 2017-07-19 LAB — ACID FAST SMEAR (AFB, MYCOBACTERIA): Acid Fast Smear: NEGATIVE

## 2017-07-19 MED ORDER — CHLORHEXIDINE GLUCONATE 0.12 % MT SOLN
15.0000 mL | Freq: Two times a day (BID) | OROMUCOSAL | Status: DC
  Administered 2017-07-19 – 2017-08-01 (×25): 15 mL via OROMUCOSAL
  Filled 2017-07-19 (×24): qty 15

## 2017-07-19 MED ORDER — LIDOCAINE-EPINEPHRINE (PF) 1 %-1:200000 IJ SOLN
10.0000 mL | Freq: Once | INTRAMUSCULAR | Status: AC
  Administered 2017-07-19: 10 mL via INTRADERMAL
  Filled 2017-07-19: qty 10

## 2017-07-19 MED ORDER — HYDRALAZINE HCL 20 MG/ML IJ SOLN
10.0000 mg | Freq: Four times a day (QID) | INTRAMUSCULAR | Status: DC | PRN
  Administered 2017-07-19 – 2017-07-31 (×5): 10 mg via INTRAVENOUS
  Filled 2017-07-19 (×5): qty 1

## 2017-07-19 MED ORDER — ORAL CARE MOUTH RINSE
15.0000 mL | Freq: Two times a day (BID) | OROMUCOSAL | Status: DC
  Administered 2017-07-20 – 2017-07-24 (×10): 15 mL via OROMUCOSAL

## 2017-07-19 MED ORDER — TAMSULOSIN HCL 0.4 MG PO CAPS
0.4000 mg | ORAL_CAPSULE | Freq: Every day | ORAL | Status: DC
  Administered 2017-07-19 – 2017-07-22 (×4): 0.4 mg via ORAL
  Filled 2017-07-19 (×4): qty 1

## 2017-07-19 MED ORDER — GABAPENTIN 400 MG PO CAPS
800.0000 mg | ORAL_CAPSULE | Freq: Three times a day (TID) | ORAL | Status: DC
  Administered 2017-07-19 – 2017-07-22 (×8): 800 mg via ORAL
  Filled 2017-07-19 (×9): qty 2

## 2017-07-19 MED ORDER — METHOCARBAMOL 1000 MG/10ML IJ SOLN
750.0000 mg | Freq: Three times a day (TID) | INTRAVENOUS | Status: DC | PRN
  Administered 2017-07-20 – 2017-07-28 (×7): 750 mg via INTRAVENOUS
  Filled 2017-07-19 (×8): qty 7.5

## 2017-07-19 MED ORDER — MAGNESIUM SULFATE 4 GM/100ML IV SOLN
4.0000 g | Freq: Once | INTRAVENOUS | Status: AC
  Administered 2017-07-19: 4 g via INTRAVENOUS
  Filled 2017-07-19: qty 100

## 2017-07-19 NOTE — Progress Notes (Signed)
72 year old smoker with right upper lobe opacity in the setting of small bowel obstruction secondary to internal hernia and bile leak underwent laparotomy and small bowel resection 7/18. Bronchoscopy with right upper lobe cultures were obtained which are so far negative, purulent secretions noted, no endobronchial lesions.  He is doing well this morning from a respiratory standpoint, on nasal cannula 2 L with saturation 90%, clear breath sounds, S1-S2 normal, tender abdomen  Await cytology and final culture data to decide duration of antibiotics Hemoptysis has resolved, appears to be infectious pneumonia  Laycee Fitzsimmons V. Awanda Mink MD. Shade Flood. [facility] Pager [phone] If no response call [phone]   07/19/2017

## 2017-07-19 NOTE — Progress Notes (Signed)
Wound oozing. Team made aware. They cam and put surgiseal in the wound to help it stop. Dressing applied and pressure applied. wound still oozing. PA made aware of this. She stated that it is ok and the dressing is just to be re-enforiced tillk the am, so that we can give the surgiseal chance to work.

## 2017-07-19 NOTE — Plan of Care (Signed)
  Problem: Coping: Goal: Level of anxiety will decrease Outcome: Progressing   Problem: Elimination: Goal: Will not experience complications related to urinary retention Outcome: Progressing   Problem: Pain Managment: Goal: General experience of comfort will improve Outcome: Progressing   Problem: Coping: Goal: Level of anxiety will decrease Outcome: Progressing

## 2017-07-19 NOTE — Progress Notes (Signed)
Patient ID: Gerald Farrell, male   DOB: [birth year], 72 y.o.   MRN: [id number]    1 Day Post-Op  Subjective: Pt has better control now that he is on a PCA.  No other complaints overnight except the pain issue.  Discussed what we found in the OR and why he has a drain, midline wound, etc.  He is very appreciative for his care.  Objective: Vital signs in last 24 hours: Temp:  [97.5 F (36.4 C)-98 F (36.7 C)] 97.8 F (36.6 C) (07/19 0809) Pulse Rate:  [57-78] 57 (07/19 0500) Resp:  [10-23] 10 (07/19 0500) BP: (117-155)/(68-86) 120/70 (07/19 0500) SpO2:  [97 %-100 %] 100 % (07/19 0500) Last BM Date: 07/17/17  Intake/Output from previous day: 07/18 0701 - 07/19 0700 In: 4182.7 [I.V.:2671.7; Blood:630; IV Piggyback:880.9] Out: 3100 [Urine:2605; Drains:195; Blood:300] Intake/Output this shift: Total I/O In: -  Out: 50 [Urine:50]  PE: Heart: regular Lungs: CTAB Abd: NGT hooked up incorrectly, and wall suction turned off overnight.  Corrected this morning but myself and day RN.  About 250cc of bilious output since correction.  Some BS, ND, appropriately tender, JP drain with dark, bloody, thin output.  Bubbles some, may have some bile in it.  Difficult to tell.  Midline wound is clean and packed. GU: foley in place with straw colored urine.  Lab Results:  Recent Labs    07/18/17 2134 07/19/17 0329  WBC 22.8* 20.1*  HGB 13.0 12.9*  HCT 39.1 39.1  PLT 333 304   BMET Recent Labs    07/18/17 2134 07/19/17 0329  NA 132* 135  K 3.5 3.8  CL 105 107  CO2 21* 23  GLUCOSE 133* 120*  BUN 5* 6*  CREATININE 0.43* 0.42*  CALCIUM 8.1* 7.9*   PT/INR No results for input(s): LABPROT, INR in the last 72 hours. CMP     Component Value Date/Time   NA 135 07/19/2017 0329   K 3.8 07/19/2017 0329   CL 107 07/19/2017 0329   CO2 23 07/19/2017 0329   GLUCOSE 120 (H) 07/19/2017 0329   BUN 6 (L) 07/19/2017 0329   CREATININE 0.42 (L) 07/19/2017 0329   CALCIUM 7.9 (L) 07/19/2017 0329     PROT 5.7 (L) 07/14/2017 0943   ALBUMIN 1.9 (L) 07/14/2017 0943   AST 43 (H) 07/14/2017 0943   ALT 30 07/14/2017 0943   ALKPHOS 81 07/14/2017 0943   BILITOT 0.9 07/14/2017 0943   GFRNONAA >60 07/19/2017 0329   GFRAA >60 07/19/2017 0329   Lipase     Component Value Date/Time   LIPASE 21 07/14/2017 0943       Studies/Results: Ct Chest W Contrast  Addendum Date: 07/17/2017   ADDENDUM REPORT: 07/17/2017 16:18 ADDENDUM: Results were further discussed by telephone with Dr. Greer Pickerel. The enteric contrast was administered on 07/14/2017 as part of the small bowel obstruction protocol. As noted, this contrast is now in the colon. The appendix is visualized on this CT and appears normal (best seen on coronal images 77 through 87). Current findings still support a progressive partial distal small bowel obstruction. Electronically Signed   By: Richardean Sale M.D.   On: 07/17/2017 16:18   Result Date: 07/17/2017 CLINICAL DATA:  Right hilar mass on radiographs. Bowel obstruction. History of basal cell carcinoma and hypertension. EXAM: CT CHEST, ABDOMEN, AND PELVIS WITH CONTRAST TECHNIQUE: Multidetector CT imaging of the chest, abdomen and pelvis was performed following the standard protocol during bolus administration of intravenous contrast. CONTRAST:  165mL  OMNIPAQUE IOHEXOL 300 MG/ML  SOLN COMPARISON:  Chest radiographs 07/16/2017. Abdominopelvic CT 07/14/2017 and 04/27/2017. FINDINGS: CT CHEST FINDINGS Cardiovascular: There is atherosclerosis of the aorta, great vessels and coronary arteries. No acute vascular findings are demonstrated. The heart size is normal. There is no pericardial effusion. Mediastinum/Nodes: There are no enlarged axillary or mediastinal lymph nodes. Right hilar soft tissue measuring 2.7 x 1.1 cm on image 32/3 may reflect adenopathy, likely reactive in etiology. Small right thyroid nodule is of doubtful significance. The trachea and esophagus appear normal. Lungs/Pleura: Trace  bilateral pleural effusions. There is focal consolidation in the anterior segment of the right upper lobe which was not present on the abdominal CT done 04/27/2017. This measures up to 6.9 x 5.0 cm on image 32/3. There is associated central low-density and small air bubbles. On the reformatted images, there is mild inferior bowing of the minor fissure. Surrounding this area of consolidation, there is patchy airspace disease elsewhere in the right upper lobe. There is linear atelectasis in both lower lobes. Mild underlying emphysema noted. Musculoskeletal/Chest wall: No chest wall mass or suspicious osseous findings. CT ABDOMEN AND PELVIS FINDINGS Hepatobiliary: The liver is normal in density without focal abnormality. No evidence of gallstones, gallbladder wall thickening or biliary dilatation. Pancreas: Unremarkable. No pancreatic ductal dilatation or surrounding inflammatory changes. Spleen: Normal in size without focal abnormality. Adrenals/Urinary Tract: Both adrenal glands appear normal. There are stable small renal cysts bilaterally. There is cortical scarring in the lower pole of the left kidney. No evidence of urinary tract calculus or hydronephrosis. The bladder appears normal. Stomach/Bowel: There is a small amount of enteric contrast which is present in the proximal colon. The stomach and small bowel are fluid-filled and mildly dilated diffusely. This has progressed from the recent CT. The extreme terminal ileum appears decompressed, best seen on the coronal images. The cecum is located in the right mid abdomen. There is no significant colonic distention. There is mild wall thickening of the sigmoid colon without focal surrounding inflammation. Vascular/Lymphatic: There are no enlarged abdominal or pelvic lymph nodes. There is aortic and branch vessel atherosclerosis. No acute vascular findings. Reproductive: The prostate gland and seminal vesicles appear normal. Other: There is progressive ascites with  edema throughout the mesenteric and subcutaneous fat. No focal extraluminal fluid or air collections are seen. There is no extravasated enteric contrast. Musculoskeletal: No acute or significant osseous findings. There is extensive lumbar spondylosis associated with a convex right scoliosis. IMPRESSION: 1. Right upper lobe bacterial pneumonia with possible early lung abscesses. Given the associated bowing of the minor fissure, consider Klebsiella pneumonia. Radiographic follow-up recommended to document clearing and exclude underlying malignancy. Probable associated reactive right hilar adenopathy. 2. Progressive distal small bowel obstruction. Specific etiology is not delineated, although based on previous study and location of the cecum, this could be secondary to an internal hernia or adhesions. 3. Progressive ascites and generalized soft tissue edema. No focal extraluminal fluid collection. 4. Mild sigmoid colon wall thickening, nonspecific. 5. Aortic Atherosclerosis (ICD10-I70.0). No acute vascular findings. Electronically Signed: By: Richardean Sale M.D. On: 07/17/2017 14:53   Ct Abdomen Pelvis W Contrast  Addendum Date: 07/17/2017   ADDENDUM REPORT: 07/17/2017 16:18 ADDENDUM: Results were further discussed by telephone with Dr. Greer Pickerel. The enteric contrast was administered on 07/14/2017 as part of the small bowel obstruction protocol. As noted, this contrast is now in the colon. The appendix is visualized on this CT and appears normal (best seen on coronal images 77 through 87).  Current findings still support a progressive partial distal small bowel obstruction. Electronically Signed   By: Richardean Sale M.D.   On: 07/17/2017 16:18   Result Date: 07/17/2017 CLINICAL DATA:  Right hilar mass on radiographs. Bowel obstruction. History of basal cell carcinoma and hypertension. EXAM: CT CHEST, ABDOMEN, AND PELVIS WITH CONTRAST TECHNIQUE: Multidetector CT imaging of the chest, abdomen and pelvis was  performed following the standard protocol during bolus administration of intravenous contrast. CONTRAST:  130mL OMNIPAQUE IOHEXOL 300 MG/ML  SOLN COMPARISON:  Chest radiographs 07/16/2017. Abdominopelvic CT 07/14/2017 and 04/27/2017. FINDINGS: CT CHEST FINDINGS Cardiovascular: There is atherosclerosis of the aorta, great vessels and coronary arteries. No acute vascular findings are demonstrated. The heart size is normal. There is no pericardial effusion. Mediastinum/Nodes: There are no enlarged axillary or mediastinal lymph nodes. Right hilar soft tissue measuring 2.7 x 1.1 cm on image 32/3 may reflect adenopathy, likely reactive in etiology. Small right thyroid nodule is of doubtful significance. The trachea and esophagus appear normal. Lungs/Pleura: Trace bilateral pleural effusions. There is focal consolidation in the anterior segment of the right upper lobe which was not present on the abdominal CT done 04/27/2017. This measures up to 6.9 x 5.0 cm on image 32/3. There is associated central low-density and small air bubbles. On the reformatted images, there is mild inferior bowing of the minor fissure. Surrounding this area of consolidation, there is patchy airspace disease elsewhere in the right upper lobe. There is linear atelectasis in both lower lobes. Mild underlying emphysema noted. Musculoskeletal/Chest wall: No chest wall mass or suspicious osseous findings. CT ABDOMEN AND PELVIS FINDINGS Hepatobiliary: The liver is normal in density without focal abnormality. No evidence of gallstones, gallbladder wall thickening or biliary dilatation. Pancreas: Unremarkable. No pancreatic ductal dilatation or surrounding inflammatory changes. Spleen: Normal in size without focal abnormality. Adrenals/Urinary Tract: Both adrenal glands appear normal. There are stable small renal cysts bilaterally. There is cortical scarring in the lower pole of the left kidney. No evidence of urinary tract calculus or hydronephrosis. The  bladder appears normal. Stomach/Bowel: There is a small amount of enteric contrast which is present in the proximal colon. The stomach and small bowel are fluid-filled and mildly dilated diffusely. This has progressed from the recent CT. The extreme terminal ileum appears decompressed, best seen on the coronal images. The cecum is located in the right mid abdomen. There is no significant colonic distention. There is mild wall thickening of the sigmoid colon without focal surrounding inflammation. Vascular/Lymphatic: There are no enlarged abdominal or pelvic lymph nodes. There is aortic and branch vessel atherosclerosis. No acute vascular findings. Reproductive: The prostate gland and seminal vesicles appear normal. Other: There is progressive ascites with edema throughout the mesenteric and subcutaneous fat. No focal extraluminal fluid or air collections are seen. There is no extravasated enteric contrast. Musculoskeletal: No acute or significant osseous findings. There is extensive lumbar spondylosis associated with a convex right scoliosis. IMPRESSION: 1. Right upper lobe bacterial pneumonia with possible early lung abscesses. Given the associated bowing of the minor fissure, consider Klebsiella pneumonia. Radiographic follow-up recommended to document clearing and exclude underlying malignancy. Probable associated reactive right hilar adenopathy. 2. Progressive distal small bowel obstruction. Specific etiology is not delineated, although based on previous study and location of the cecum, this could be secondary to an internal hernia or adhesions. 3. Progressive ascites and generalized soft tissue edema. No focal extraluminal fluid collection. 4. Mild sigmoid colon wall thickening, nonspecific. 5. Aortic Atherosclerosis (ICD10-I70.0). No acute vascular findings.  Electronically Signed: By: Richardean Sale M.D. On: 07/17/2017 14:53    Anti-infectives: Anti-infectives (From admission, onward)   Start      Dose/Rate Route Frequency Ordered Stop   07/14/17 0900  piperacillin-tazobactam (ZOSYN) IVPB 3.375 g     3.375 g 12.5 mL/hr over 240 Minutes Intravenous Every 8 hours 07/14/17 0818         Assessment/Plan Hypertension- severe on multiple agents, will hold meds for today and see how his BP responds.  Will restart possibly tomorrow pending his numbers. Hypokalemia-resolved K+ 4.0 Hypomagnesemia- Mg 1.3,, mag replaced today, check in am Chronic anemia- Hg 12.9 Tobacco use- nicotine patch Chronic pain - restart robaxin and gabapentin Right hilum/right upper lobe mass/masslike consolidation -CXR 7/16  -purulent material found in lung on bronchoscopy by Dr. Lamonte Sakai.  CXs etc are pending.  Will need to hear from them how long abxs are necessary.  Small bowel obstruction POD 1, s/p lap converted to open LOA, SBR, completion cholecystectomy, Dr. Redmond Pulling, 7/18 -tx to floor -cont NGT to LIWS for ileus -DC foley, I&O prn and then replace if needed.  Resume flomax for h/o BPH -cont JP drain, watch for bilious output as he may develop a bile leak from his liver -NS WD dressing changes BID to midline wound -PT/OT for mobilization -IS for pulm toileting  S/P laparoscopic partial cholecystectomy 04/29/2017, Dr. Ralene Ok Bile leak with postop stent placement and stent removal, Dr.CarlGessner  Fen: IV fluids/NPO/NGT, consider nutrition in several days if not bowel function ID: Zosyn 7/14=>> DVT: Lovenox Follow-up: Dr. Redmond Pulling   LOS: 5 days    Henreitta Cea , [facility] 07/19/2017, 8:49 AM Pager: [phone]

## 2017-07-19 NOTE — Progress Notes (Signed)
Patient ID: Gerald Farrell, male   DOB: 1945/04/26, 72 y.o.   MRN: [id number] Called by RN to report ongoing incisional bleeding despite topical hemostatic. On exam, there was ooze from some subcutaneous tissue lower 1/3 of incision. It stopped after I held pressure. I then closed the lower 2/3 of the incision using lidocaine, sterile technique and 3-0 Prolene with vertical and horizontal matteress. Good hemostasis. Wound was dressed again.  Georganna Skeans, MD, MPH, FACS Trauma: [phone] General Surgery: [phone]

## 2017-07-20 LAB — BASIC METABOLIC PANEL
ANION GAP: 5 (ref 5–15)
BUN: 5 mg/dL — ABNORMAL LOW (ref 8–23)
CO2: 23 mmol/L (ref 22–32)
Calcium: 7.8 mg/dL — ABNORMAL LOW (ref 8.9–10.3)
Chloride: 110 mmol/L (ref 98–111)
Creatinine, Ser: 0.47 mg/dL — ABNORMAL LOW (ref 0.61–1.24)
Glucose, Bld: 115 mg/dL — ABNORMAL HIGH (ref 70–99)
Potassium: 3.8 mmol/L (ref 3.5–5.1)
Sodium: 138 mmol/L (ref 135–145)

## 2017-07-20 LAB — CBC
HCT: 33.6 % — ABNORMAL LOW (ref 39.0–52.0)
Hemoglobin: 11 g/dL — ABNORMAL LOW (ref 13.0–17.0)
MCH: 29.1 pg (ref 26.0–34.0)
MCHC: 32.7 g/dL (ref 30.0–36.0)
MCV: 88.9 fL (ref 78.0–100.0)
Platelets: 352 10*3/uL (ref 150–400)
RBC: 3.78 MIL/uL — ABNORMAL LOW (ref 4.22–5.81)
RDW: 14.3 % (ref 11.5–15.5)
WBC: 15.2 10*3/uL — AB (ref 4.0–10.5)

## 2017-07-20 LAB — PHOSPHORUS: Phosphorus: 2.2 mg/dL — ABNORMAL LOW (ref 2.5–4.6)

## 2017-07-20 LAB — MAGNESIUM: MAGNESIUM: 2 mg/dL (ref 1.7–2.4)

## 2017-07-20 MED ORDER — CLONIDINE HCL 0.1 MG PO TABS
0.1000 mg | ORAL_TABLET | Freq: Three times a day (TID) | ORAL | Status: DC
  Administered 2017-07-20 – 2017-07-22 (×8): 0.1 mg via ORAL
  Filled 2017-07-20 (×8): qty 1

## 2017-07-20 MED ORDER — HYDROCHLOROTHIAZIDE 25 MG PO TABS
25.0000 mg | ORAL_TABLET | Freq: Every day | ORAL | Status: DC
  Administered 2017-07-20 – 2017-07-22 (×3): 25 mg via ORAL
  Filled 2017-07-20 (×3): qty 1

## 2017-07-20 MED ORDER — CARVEDILOL 25 MG PO TABS
25.0000 mg | ORAL_TABLET | Freq: Two times a day (BID) | ORAL | Status: DC
  Administered 2017-07-21 – 2017-07-23 (×5): 25 mg via ORAL
  Filled 2017-07-20 (×5): qty 1

## 2017-07-20 MED ORDER — SODIUM CHLORIDE 0.9 % IV SOLN
Freq: Once | INTRAVENOUS | Status: AC
  Administered 2017-07-20: 13:00:00 via INTRAVENOUS

## 2017-07-20 MED ORDER — METOPROLOL TARTRATE 5 MG/5ML IV SOLN
5.0000 mg | Freq: Once | INTRAVENOUS | Status: AC
  Administered 2017-07-20: 5 mg via INTRAVENOUS
  Filled 2017-07-20: qty 5

## 2017-07-20 MED ORDER — AMLODIPINE BESYLATE 5 MG PO TABS
5.0000 mg | ORAL_TABLET | Freq: Every day | ORAL | Status: DC
  Administered 2017-07-20 – 2017-07-22 (×3): 5 mg via ORAL
  Filled 2017-07-20 (×3): qty 1

## 2017-07-20 NOTE — Progress Notes (Signed)
Patient complain of anxiety and Shortness of breath. BP at 1200 90/75 pulse 110. Rapid response called. Dr. Marcello Moores notified. Bolus given. BP at 1232 79/65. BP at 1304 101/65. Will continue to monitor patient.

## 2017-07-20 NOTE — Evaluation (Signed)
Occupational Therapy Evaluation Patient Details Name: Gerald Farrell MRN: [id number] DOB: 1945-05-15 Today's Date: 07/20/2017    History of Present Illness 72 y.o. male admitted with SBO and hematemesis.   He underwent open lysis of adhesions, Small bowel resection, completion of chelcystectomy on 7/18. He was found to have lung abcess on CT and underwent brochoscopy.    PMH includes:  ETOH abuse, basal cell carcinoma, HTN, acute cholecystitis, s/p lumbar laminectomy,  s/p TKA    Clinical Impression   Pt admitted with above. He demonstrates the below listed deficits and will benefit from continued OT to maximize safety and independence with BADLs.  Pt presents to OT with generalized weakness, pain, decreased activity tolerance.  He requires mod A for LB ADLs due to pain.  He requires min guard assist for functional mobility.  He lives with son, and was mod I PTA.   Anticipate good progress.        Follow Up Recommendations  No OT follow up;Supervision/Assistance - 24 hour    Equipment Recommendations  None recommended by OT    Recommendations for Other Services       Precautions / Restrictions        Mobility Bed Mobility Overal bed mobility: Needs Assistance Bed Mobility: Supine to Sit     Supine to sit: HOB elevated;Supervision        Transfers Overall transfer level: Needs assistance Equipment used: Rolling walker (2 wheeled) Transfers: Sit to/from [facility] Sit to Stand: Min guard Stand pivot transfers: Min guard            Balance Overall balance assessment: Mild deficits observed, not formally tested                                         ADL either performed or assessed with clinical judgement   ADL Overall ADL's : Needs assistance/impaired Eating/Feeding: NPO Eating/Feeding Details (indicate cue type and reason): able to feed self ice chips  Grooming: Wash/dry hands;Wash/dry face;Oral care;Brushing hair;Set  up;Sitting   Upper Body Bathing: Minimal assistance;Sitting   Lower Body Bathing: Moderate assistance;Sit to/from stand   Upper Body Dressing : Minimal assistance;Sitting   Lower Body Dressing: Moderate assistance;Sit to/from stand   Toilet Transfer: Min guard;Ambulation;Comfort height toilet;Grab bars;RW;BSC   Toileting- Clothing Manipulation and Hygiene: Minimal assistance;Sit to/from stand       Functional mobility during ADLs: Min guard;Rolling walker General ADL Comments: Pt with difficulty accessing feet due to abdominal pain.        Vision         Perception     Praxis      Pertinent Vitals/Pain Pain Assessment: 0-10 Faces Pain Scale: Hurts even more Pain Location: adomen, R side of chest Pain Descriptors / Indicators: Discomfort;Operative site guarding Pain Intervention(s): PCA encouraged;Repositioned;Monitored during session     Hand Dominance Right   Extremity/Trunk Assessment Upper Extremity Assessment Upper Extremity Assessment: Generalized weakness   Lower Extremity Assessment Lower Extremity Assessment: Generalized weakness   Cervical / Trunk Assessment Cervical / Trunk Assessment: Kyphotic;Other exceptions(Pt reports h/o scoloiosis )   Communication Communication Communication: No difficulties   Cognition Arousal/Alertness: Awake/alert Behavior During Therapy: WFL for tasks assessed/performed Overall Cognitive Status: Within Functional Limits for tasks assessed  General Comments  Pt ambulated ~332ft with min guard assist.  02 sats remained >95% on RA     Exercises     Shoulder Instructions      Home Living Family/patient expects to be discharged to:: Private residence Living Arrangements: Children Available Help at Discharge: Available 24 hours/day;Family Type of Home: House Home Access: Stairs to enter [employer] of Steps: 2(small steps ) Entrance Stairs-Rails:  None Home Layout: One level     Bathroom Shower/Tub: Teacher, early years/pre: Standard Bathroom Accessibility: No   Home Equipment: Tub bench;Hand held shower head;Cane - single point;Walker - 2 wheels          Prior Functioning/Environment Level of Independence: Independent with assistive device(s)        Comments: Drives, Uses SPC for ambulation in community.  Is a retired Social worker.  Enjoys yoga         OT Problem List: Decreased strength;Decreased activity tolerance;Impaired balance (sitting and/or standing);Decreased cognition;Decreased knowledge of use of DME or AE;Pain      OT Treatment/Interventions: Self-care/ADL training;DME and/or AE instruction;Therapeutic activities;Patient/family education;Balance training    OT Goals(Current goals can be found in the care plan section) Acute Rehab OT Goals Patient Stated Goal: to get back to yoga  OT Goal Formulation: With patient Time For Goal Achievement: 08/03/17 Potential to Achieve Goals: Good ADL Goals Pt Will Perform Grooming: with modified independence;standing Pt Will Perform Upper Body Bathing: with modified independence;sitting;standing Pt Will Perform Lower Body Bathing: with modified independence;sit to/from stand Pt Will Perform Upper Body Dressing: with modified independence;sitting;standing Pt Will Perform Lower Body Dressing: with modified independence;sit to/from stand Pt Will Transfer to Toilet: with modified independence;ambulating;bedside commode;grab bars Pt Will Perform Toileting - Clothing Manipulation and hygiene: with modified independence;sit to/from stand Pt Will Perform Tub/Shower Transfer: Tub transfer;with modified independence;ambulating;shower seat;rolling walker  OT Frequency: Min 2X/week   Barriers to D/C:            Co-evaluation              AM-PAC PT "6 Clicks" Daily Activity     Outcome Measure Help from another person eating meals?: None Help from another  person taking care of personal grooming?: None Help from another person toileting, which includes using toliet, bedpan, or urinal?: A Little Help from another person bathing (including washing, rinsing, drying)?: A Lot Help from another person to put on and taking off regular upper body clothing?: A Little Help from another person to put on and taking off regular lower body clothing?: A Lot 6 Click Score: 18   End of Session Equipment Utilized During Treatment: Rolling walker Nurse Communication: Mobility status  Activity Tolerance: Patient limited by pain Patient left: in chair;with call bell/phone within reach;with nursing/sitter in room  OT Visit Diagnosis: Pain Pain - part of body: (abdomen )                Time: 6270-3500 OT Time Calculation (min): 37 min Charges:  OT General Charges $OT Visit: 1 Visit OT Evaluation $OT Eval Moderate Complexity: 1 Mod OT Treatments $Therapeutic Activity: 8-22 mins G-Codes:     [facility], OTR/L [phone]   Damita Eppard M 07/20/2017, 11:20 AM

## 2017-07-20 NOTE — Progress Notes (Signed)
Patient ID: Gerald Farrell, male   DOB: 1945/04/22, 72 y.o.   MRN: [id number]    2 Days Post-Op  Subjective: Pt has better control on a PCA.  No other complaints.  Had some incisional bleeding that was sutured last night.    Objective: Vital signs in last 24 hours: Temp:  [97.6 F (36.4 C)-98.6 F (37 C)] 98.1 F (36.7 C) (07/20 0455) Pulse Rate:  [67-89] 85 (07/20 0455) Resp:  [12-17] 13 (07/20 0819) BP: (144-197)/(82-99) 179/82 (07/20 0455) SpO2:  [99 %-100 %] 100 % (07/20 0819) Last BM Date: 07/17/17  Intake/Output from previous day: 07/19 0701 - 07/20 0700 In: 2925.7 [P.O.:60; I.V.:2515.7; IV Piggyback:350] Out: 1640 [Urine:460; Emesis/NG output:1000; Drains:180] Intake/Output this shift: Total I/O In: -  Out: 375 [Urine:375]  PE: Heart: regular Lungs: CTAB Abd: NGT in place.   ND, appropriately tender, JP drain with dark, bloody, thin output.    Midline wound is clean and packed.   Lab Results:  Recent Labs    07/19/17 0329 07/20/17 0617  WBC 20.1* 15.2*  HGB 12.9* 11.0*  HCT 39.1 33.6*  PLT 304 352   BMET Recent Labs    07/19/17 0329 07/20/17 0617  NA 135 138  K 3.8 3.8  CL 107 110  CO2 23 23  GLUCOSE 120* 115*  BUN 6* 5*  CREATININE 0.42* 0.47*  CALCIUM 7.9* 7.8*   PT/INR No results for input(s): LABPROT, INR in the last 72 hours. CMP     Component Value Date/Time   NA 138 07/20/2017 0617   K 3.8 07/20/2017 0617   CL 110 07/20/2017 0617   CO2 23 07/20/2017 0617   GLUCOSE 115 (H) 07/20/2017 0617   BUN 5 (L) 07/20/2017 0617   CREATININE 0.47 (L) 07/20/2017 0617   CALCIUM 7.8 (L) 07/20/2017 0617   PROT 5.7 (L) 07/14/2017 0943   ALBUMIN 1.9 (L) 07/14/2017 0943   AST 43 (H) 07/14/2017 0943   ALT 30 07/14/2017 0943   ALKPHOS 81 07/14/2017 0943   BILITOT 0.9 07/14/2017 0943   GFRNONAA >60 07/20/2017 0617   GFRAA >60 07/20/2017 0617   Lipase     Component Value Date/Time   LIPASE 21 07/14/2017 0943       Studies/Results: No  results found.  Anti-infectives: Anti-infectives (From admission, onward)   Start     Dose/Rate Route Frequency Ordered Stop   07/14/17 0900  piperacillin-tazobactam (ZOSYN) IVPB 3.375 g     3.375 g 12.5 mL/hr over 240 Minutes Intravenous Every 8 hours 07/14/17 0818         Assessment/Plan Hypertension- severe on multiple agents, will hold meds for today and see how his BP responds.  Will restart possibly tomorrow pending his numbers. Hypokalemia-resolved K+ 4.0 Hypomagnesemia- Mg 1.3,, mag replaced today, check in am Chronic anemia- Hg 12.9 Tobacco use- nicotine patch Chronic pain - restart robaxin and gabapentin Right hilum/right upper lobe mass/masslike consolidation -CXR 7/16  -purulent material found in lung on bronchoscopy by Dr. Lamonte Sakai.  CXs etc are pending.  Will need to hear from them how long abxs are necessary.  Small bowel obstruction POD 2, s/p lap converted to open LOA, SBR, completion cholecystectomy, Dr. Redmond Pulling, 7/18 -tx to floor -cont NGT to LIWS for ileus -DC foley, I&O prn and then replace if needed.  Resume flomax for h/o BPH -cont JP drain, watch for bilious output as he may develop a bile leak from his liver -NS WD dressing changes BID to midline wound -PT/OT  for mobilization -IS for pulm toileting  S/P laparoscopic partial cholecystectomy 04/29/2017, Dr. Ralene Ok Bile leak with postop stent placement and stent removal, Dr.CarlGessner  Fen: IV fluids/NPO/NGT, consider nutrition in several days if not bowel function ID: Zosyn 7/14=>> DVT: Lovenox Follow-up: Dr. Redmond Pulling   LOS: 6 days     Rosario Adie, MD  [facility]

## 2017-07-20 NOTE — Significant Event (Addendum)
Rapid Response Event Note  Overview: Time Called: 1223 Event Type: Hypotension  Initial Focused Assessment: Patient transferred from ICU yesterday afternoon. POD 2 SBO  Ngt for ileus.  Per staff and notes bleeding from incision yesterday afternoon, sutured by Dr Grandville Silos. 150cc bloody drainage from JP drain over past 24 hrs.  This am Hgb  11.  This am his BP meds were restarted.  BP at that time was 182/92  HR 93.  Received 5mg  Norvasc, 0.1 Catapres and 25mg  hydrochlorothiazide This afternoon his BP is 83/64 and 79/65.  He feels dizzy when OOB.  Assisted back to bed.  Interventions: PIV placed by IV team NS bolus  D5 w 20KCl infusing 125cc/hr  BP improving 101/65  1800  Pt Bp has continued to improve, now 158/85  Plan of Care (if not transferred): RN to call if assistance needed  Event Summary: Name of Physician Notified: Dr Marcello Moores, CCS at      at    Outcome: Stayed in room and stabalized     [place], [place]

## 2017-07-20 NOTE — Progress Notes (Signed)
Changed PCA syringe, wasted approximately 87ml of dilaudid with Ludwig Clarks RN.

## 2017-07-21 LAB — CULTURE, BAL-QUANTITATIVE: CULTURE: NO GROWTH

## 2017-07-21 LAB — CULTURE, BAL-QUANTITATIVE W GRAM STAIN

## 2017-07-21 NOTE — Progress Notes (Signed)
Patient ID: Gerald Farrell, male   DOB: 1945-01-23, 72 y.o.   MRN: [id number] 3 Days Post-Op   Subjective: He feels better today.  Had a brief episode of shortness of breath and some drop in blood pressure yesterday that responded to fluids.  He has had some occasional pain in his right upper chest but not currently.  Abdominal pain is better.  A little flatus but no bowel movements.  Objective: Vital signs in last 24 hours: Temp:  [97.7 F (36.5 C)-99 F (37.2 C)] 99 F (37.2 C) (07/21 0921) Pulse Rate:  [88-98] 90 (07/21 0921) Resp:  [10-16] 10 (07/21 0921) BP: (79-182)/(64-105) 157/88 (07/21 0921) SpO2:  [100 %] 100 % (07/21 0921) FiO2 (%):  [2 %] 2 % (07/21 0921) Last BM Date: 07/17/17  Intake/Output from previous day: 07/20 0701 - 07/21 0700 In: 1656.7 [P.O.:60; I.V.:1360.1; IV Piggyback:236.6] Out: 2270 [Urine:1350; Emesis/NG output:800; Drains:120] Intake/Output this shift: Total I/O In: 0  Out: 400 [Urine:400]  General appearance: alert, cooperative, no distress and Thin and chronically ill-appearing Resp: clear to auscultation bilaterally and Without increased work of breathing GI: normal findings: soft, non-tender and Nondistended Wound: Midline wound packed without erythema.  JP drainage is serosanguineous.  Lab Results:  Recent Labs    07/19/17 0329 07/20/17 0617  WBC 20.1* 15.2*  HGB 12.9* 11.0*  HCT 39.1 33.6*  PLT 304 352   BMET Recent Labs    07/19/17 0329 07/20/17 0617  NA 135 138  K 3.8 3.8  CL 107 110  CO2 23 23  GLUCOSE 120* 115*  BUN 6* 5*  CREATININE 0.42* 0.47*  CALCIUM 7.9* 7.8*     Studies/Results: No results found.  Anti-infectives: Anti-infectives (From admission, onward)   Start     Dose/Rate Route Frequency Ordered Stop   07/14/17 0900  piperacillin-tazobactam (ZOSYN) IVPB 3.375 g     3.375 g 12.5 mL/hr over 240 Minutes Intravenous Every 8 hours 07/14/17 0818        Assessment/Plan: s/p Procedure(s): LAPAROSCOPY  DIAGNOSTIC, lysis of adhesions VIDEO BRONCHOSCOPY EXPLORATORY LAPAROTOMY, COMPLETION OF CHOLECYSTECTOMY,  SMALL BOWEL RESECTION. Right upper lobe pneumonia, studies pending Some improvement today.  Reduced leukocytosis.  Symptomatically better.  Chest is clear and abdomen benign.  Continue NG today until we have a little more bowel function.  On IV antibiotics for pneumonia and cholecystitis.    LOS: 7 days    Edward Jolly 07/21/2017

## 2017-07-22 ENCOUNTER — Inpatient Hospital Stay: Payer: Self-pay

## 2017-07-22 DIAGNOSIS — J189 Pneumonia, unspecified organism: Secondary | ICD-10-CM

## 2017-07-22 LAB — GLUCOSE, CAPILLARY
GLUCOSE-CAPILLARY: 106 mg/dL — AB (ref 70–99)
Glucose-Capillary: 109 mg/dL — ABNORMAL HIGH (ref 70–99)

## 2017-07-22 MED ORDER — INSULIN ASPART 100 UNIT/ML ~~LOC~~ SOLN: 0.0000 [IU] | Freq: Four times a day (QID) | SUBCUTANEOUS | Status: DC

## 2017-07-22 MED ORDER — GUAIFENESIN 100 MG/5ML PO SOLN
15.0000 mL | Freq: Three times a day (TID) | ORAL | Status: DC
  Administered 2017-07-22 – 2017-08-02 (×31): 300 mg via ORAL
  Filled 2017-07-22 (×5): qty 15
  Filled 2017-07-22: qty 5
  Filled 2017-07-22: qty 15
  Filled 2017-07-22: qty 5
  Filled 2017-07-22: qty 15
  Filled 2017-07-22: qty 5
  Filled 2017-07-22: qty 15
  Filled 2017-07-22: qty 10
  Filled 2017-07-22: qty 5
  Filled 2017-07-22 (×8): qty 15
  Filled 2017-07-22 (×2): qty 5
  Filled 2017-07-22 (×2): qty 15
  Filled 2017-07-22: qty 10
  Filled 2017-07-22 (×5): qty 15
  Filled 2017-07-22: qty 5
  Filled 2017-07-22 (×2): qty 15

## 2017-07-22 MED ORDER — SODIUM CHLORIDE 0.9% FLUSH
10.0000 mL | INTRAVENOUS | Status: DC | PRN
  Administered 2017-07-24 (×2): 10 mL
  Administered 2017-08-01: 20 mL
  Filled 2017-07-22 (×3): qty 40

## 2017-07-22 MED ORDER — KCL IN DEXTROSE-NACL 20-5-0.9 MEQ/L-%-% IV SOLN
INTRAVENOUS | Status: AC
  Administered 2017-07-22 – 2017-07-23 (×2): via INTRAVENOUS
  Filled 2017-07-22 (×3): qty 1000

## 2017-07-22 MED ORDER — TRAVASOL 10 % IV SOLN
INTRAVENOUS | Status: AC
  Administered 2017-07-22: 19:00:00 via INTRAVENOUS
  Filled 2017-07-22: qty 537.6

## 2017-07-22 MED ORDER — DEXTROSE 5 % IV SOLN
20.0000 mmol | Freq: Once | INTRAVENOUS | Status: AC
  Administered 2017-07-22: 20 mmol via INTRAVENOUS
  Filled 2017-07-22: qty 6.67

## 2017-07-22 NOTE — Progress Notes (Addendum)
[facility] Progress Note  4 Days Post-Op  Subjective: CC:  Pain improved on PCA. Walked this morning and sat up in chair for one hour. Reports some increase in flatus and stomach "rumbling", but not BM. Eating about 1.5 c ice chips daily. Denies nausea when NG tube is temporarily clamped.   Objective: Vital signs in last 24 hours: Temp:  [97.9 F (36.6 C)-98.7 F (37.1 C)] 98.4 F (36.9 C) (07/22 0958) Pulse Rate:  [80-92] 92 (07/22 0958) Resp:  [10-16] 15 (07/22 0958) BP: (145-172)/(77-90) 145/89 (07/22 0958) SpO2:  [98 %-100 %] 100 % (07/22 0958) Last BM Date: 07/17/17  Intake/Output from previous day: 07/21 0701 - 07/22 0700 In: 4471.3 [P.O.:120; I.V.:3951.2; IV Piggyback:400.2] Out: 7782 [Urine:2810; Emesis/NG output:1400; Drains:35] Intake/Output this shift: Total I/O In: -  Out: 350 [Urine:350]  PE: Gen:  Alert, NAD, pleasant Card:  Regular rate and rhythm, pedal pulses 2+ BL Pulm:  Normal effort, clear to auscultation bilaterally Abd: Soft, approp tender, +BS, superior aspect of midline incision opened - granulating appropriately and no surrounding erythema, JP with sanguinous drainage  NG- 1400 cc/24h, bilious  Skin: warm and dry, no rashes  Psych: A&Ox3   Lab Results:  Recent Labs    07/20/17 0617  WBC 15.2*  HGB 11.0*  HCT 33.6*  PLT 352   BMET Recent Labs    07/20/17 0617  NA 138  K 3.8  CL 110  CO2 23  GLUCOSE 115*  BUN 5*  CREATININE 0.47*  CALCIUM 7.8*   PT/INR No results for input(s): LABPROT, INR in the last 72 hours. CMP     Component Value Date/Time   NA 138 07/20/2017 0617   K 3.8 07/20/2017 0617   CL 110 07/20/2017 0617   CO2 23 07/20/2017 0617   GLUCOSE 115 (H) 07/20/2017 0617   BUN 5 (L) 07/20/2017 0617   CREATININE 0.47 (L) 07/20/2017 0617   CALCIUM 7.8 (L) 07/20/2017 0617   PROT 5.7 (L) 07/14/2017 0943   ALBUMIN 1.9 (L) 07/14/2017 0943   AST 43 (H) 07/14/2017 0943   ALT 30 07/14/2017 0943   ALKPHOS 81  07/14/2017 0943   BILITOT 0.9 07/14/2017 0943   GFRNONAA >60 07/20/2017 0617   GFRAA >60 07/20/2017 0617   Lipase     Component Value Date/Time   LIPASE 21 07/14/2017 0943       Studies/Results: No results found.  Anti-infectives: Anti-infectives (From admission, onward)   Start     Dose/Rate Route Frequency Ordered Stop   07/14/17 0900  piperacillin-tazobactam (ZOSYN) IVPB 3.375 g     3.375 g 12.5 mL/hr over 240 Minutes Intravenous Every 8 hours 07/14/17 0818       Assessment/Plan HTN Chronic anemia Tobacco use Chronic pain - robaxin, gabapentin RUL PNA - per CCM can transition to Augmentin to complete a 14 d total course Abx   pSBO 2/2 internal hernia s/p Laparoscopic lysis of adhesions, Conversion to exploratory laparotomy with small bowel resection and primary repair of small bowel serosal tear, Completion of prior partial cholecystectomy. 7/18 Dr. Redmond Pulling -  POD#4 -  Afebrile, VSS  -  NG tube with 1,400 cc/24h; increase in flatus but still not a lot of bowel function; start TNA, hopefully can start clamping trials in next 24-48h -  Continue wet to dry dressings to midline wound -  Continue JP drain  -  Mobilize/IS   FEN: NPO, NGT to LIWS, start TNA; PCA for pain  ID: Zosyn 7/14 >> (#  8); plan to transition to Augmentin when tolerating PO VTE: SCD's, Lovenox Foley: none    LOS: 8 days    Gerald Farrell , [facility] 07/22/2017, 10:40 AM Pager: [phone] Consults: [phone] Mon-Fri 7:00 am-4:30 pm Sat-Sun 7:00 am-11:30 am

## 2017-07-22 NOTE — Progress Notes (Addendum)
Initial Nutrition Assessment  DOCUMENTATION CODES:   Severe malnutrition in context of acute illness/injury  INTERVENTION:   -TPN management per pharmacy -Consider monitoring K, Mg, and Phos daily x 3 days and repleting as necessary, as pt is at risk for refeeding syndrome  NUTRITION DIAGNOSIS:   Moderate Malnutrition related to acute illness(gangrenous cholecystits) as evidenced by energy intake < or equal to 50% for > or equal to 5 days, mild fat depletion, moderate fat depletion, mild muscle depletion, moderate muscle depletion.  GOAL:   Patient will meet greater than or equal to 90% of their needs  MONITOR:   Diet advancement, Labs, Weight trends, Skin, I & O's  REASON FOR ASSESSMENT:   NPO/Clear Liquid Diet, Consult New TPN/TNA  ASSESSMENT:   [name] with history of hypertension, tobacco use and recent melanoma removal status post laparoscopic partial cholecystectomy April 29, 2017 for gangrenous cholecystitis was transferred from [facility] for abdominal pain, small bowel obstruction and hematemesis.  7/14- NGT placed 7/18- s/p laparoscopic lysis of adhesions, ex lap with SBR and primary repair to small bowel serosal tear, and completion of prior partial cholecystectomy  Reviewed I/O's: +1400 ml x 24 hours  Case discussed with RN, who confirms plan to place PICC line and start TPN.   Spoke with pt and sister at bedside. Pt reports a decreased appetite over the past 3-4 months related to stomach pain. Pt attributes this to gangrenous cholecystitis.He shares he was consuming Ensure provided to him by the [place], however, even this was tapered off due to abdominal pain and poor tolerance. He reports minimal intake over the past 10 days- even when he was allowed clear liquids, he usually would only consume small sips.   Pt reports his UBW is around 183#. He endorses a progressive wt loss over the past 16 months; this would be a 24.5% wt  loss over this time period, which is concerning. However, no wt hx available to confirm this statement.   Pt acknowledges plan for PICC line and TPN. Explained to pt how he would receive his nutrition.  Per pharmacy note, plan to initiate TPN @ 40 ml/hr, which provides 1000 kcals and 54 grams protein, meeting 53% of estimated kcals and 60% of estimated protein needs.  Labs reviewed: CBGS: 109 (inpatient orders for glycemic control are 0-9 units insulin aspart every 6 hours).  NUTRITION - FOCUSED PHYSICAL EXAM:    Most Recent Value  Orbital Region  Mild depletion  Upper Arm Region  Moderate depletion  Thoracic and Lumbar Region  Unable to assess  Buccal Region  Mild depletion  Temple Region  Mild depletion  Clavicle Bone Region  Mild depletion  Clavicle and Acromion Bone Region  Mild depletion  Scapular Bone Region  Moderate depletion  Dorsal Hand  Moderate depletion  Patellar Region  Mild depletion  Anterior Thigh Region  Mild depletion  Posterior Calf Region  Mild depletion  Edema (RD Assessment)  None  Hair  Reviewed  Eyes  Reviewed  Mouth  Reviewed  Skin  Reviewed  Nails  Reviewed       Diet Order:   Diet Order           Diet NPO time specified Except for: Ice Chips, Other (See Comments)  Diet effective midnight          EDUCATION NEEDS:   Education needs have been addressed  Skin:  Skin Assessment: Skin Integrity Issues: Skin Integrity Issues:: Incisions Incisions: closed abdomen  Last BM:  07/17/17  Height:   Ht Readings from Last 1 Encounters:  07/14/17 5\' 9"  (1.753 m)    Weight:   Wt Readings from Last 1 Encounters:  07/14/17 138 lb (62.6 kg)    Ideal Body Weight:  72.7 kg  BMI:  Body mass index is 20.38 kg/m.  Estimated Nutritional Needs:   Kcal:  1900-2100  Protein:  90-105 grams  Fluid:  >1.9 L    Gerald Farrell A. Jimmye Norman, RD, LDN, CDE Pager: [phone] After hours Pager: [phone]

## 2017-07-22 NOTE — Progress Notes (Addendum)
[facility] PCCM  S: coughed more yesterday, had some pain in chest on Saturday, now resolved.  Still struggles with chest congestion. Taking some pills by mouth  O:  Vitals:   07/22/17 0358 07/22/17 0643 07/22/17 0646 07/22/17 0827  BP:  (!) 167/84 (!) 167/84   Pulse:  86 86   Resp: _0 Temp:  98.7 F (37.1 C) 98.7 F (37.1 C)   TempSrc:  Oral Oral   SpO2: 100% 100% 100% 98%  Weight:      Height:       1L [place]  General:  Resting comfortably in chair HENT: NCAT NG in place PULM: CTA B, normal effort CV: RRR, no mgr GI: minimal bowel sounds MSK: normal bulk and tone Neuro: awake, alert, no distress, MAEW  CT chest images independently reviewed: RML consolidation  Micro BAL: rare GPC on gram stain, culture without growth Cytology BAL: acute inflammatory changes  Impression: HCAP Small bowel obstruction Mild hypoxemia Atelectasis/splinting  Plan: Out of bed Add mucinex Wean off O2 If IV antibiotics are no longer needed for intra-abdominal process (per primary team) would change pneumonia treatment to Augmentin and plan to complete a total of 14 days (including the days treated with zosyn here).   Will need follow up in the pulmonary clinic/Dr. Byrum: have arranged for him to be see by Aug 13 11:15 with Tammy Parrett   PCCM will be available as needed.  Roselie Awkward, MD [facility] Pager: [phone] Cell: [phone] After 3pm or if no response, call [phone]

## 2017-07-22 NOTE — Progress Notes (Signed)
OT Cancellation Note  Patient Details Name: Gerald Farrell MRN: [id number] DOB: 1945/06/13   Cancelled Treatment:    Reason Eval/Treat Not Completed: Patient at procedure or test/ unavailable. Pt preparing for sterile procedure. Will check back as able.  [name], MS OTR/L  Pager: [phone]   [name] 07/22/2017, 4:41 PM

## 2017-07-22 NOTE — Progress Notes (Signed)
Physical Therapy Treatment Patient Details Name: Gerald Farrell MRN: [id number] DOB: April 16, 1945 Today's Date: 07/22/2017    History of Present Illness 72 y.o. male admitted with SBO and hematemesis.   He underwent open lysis of adhesions, Small bowel resection, completion of chelcystectomy on 7/18. He was found to have lung abcess on CT and underwent brochoscopy.    PMH includes:  ETOH abuse, basal cell carcinoma, HTN, acute cholecystitis, s/p lumbar laminectomy,  s/p TKA     PT Comments    Pt performed gait training with emphasis on posture and pacing.  Required frequent rest breaks as SPO2 decreased.  Unclear if reading is accurate as it would quickly return to greater than 90%.  Pt with productive cough post gait training.  Plan for stair training next session.    Follow Up Recommendations  No PT follow up;Supervision for mobility/OOB     Equipment Recommendations  None recommended by PT    Recommendations for Other Services       Precautions / Restrictions Restrictions Weight Bearing Restrictions: Yes    Mobility  Bed Mobility Overal bed mobility: Needs Assistance Bed Mobility: Supine to Sit     Supine to sit: HOB elevated;Supervision        Transfers Overall transfer level: Needs assistance Equipment used: Rolling walker (2 wheeled) Transfers: Sit to/from [facility] Sit to Stand: Min guard Stand pivot transfers: Min guard       General transfer comment: good power up and steadying in standing  Ambulation/Gait Ambulation/Gait assistance: Supervision Gait Distance (Feet): 600 Feet Assistive device: Rolling walker (2 wheeled) Gait Pattern/deviations: Step-through pattern;Trunk flexed Gait velocity: decreased   General Gait Details: Cues for upper trunk control, cues for pacing/breathing.  Poor signal on SPO2.     Stairs             Wheelchair Mobility    Modified Rankin (Stroke Patients Only)       Balance Overall balance  assessment: Mild deficits observed, not formally tested                                          Cognition Arousal/Alertness: Awake/alert Behavior During Therapy: WFL for tasks assessed/performed Overall Cognitive Status: Within Functional Limits for tasks assessed                                        Exercises      General Comments        Pertinent Vitals/Pain Pain Assessment: 0-10 Pain Score: 6  Pain Location: adomen, R side of chest Pain Descriptors / Indicators: Discomfort;Operative site guarding Pain Intervention(s): Monitored during session;Repositioned    Home Living                      Prior Function            PT Goals (current goals can now be found in the care plan section) Acute Rehab PT Goals Patient Stated Goal: to get back to yoga  Potential to Achieve Goals: Fair Progress towards PT goals: Progressing toward goals    Frequency           PT Plan Current plan remains appropriate    Co-evaluation  AM-PAC PT "6 Clicks" Daily Activity  Outcome Measure  Difficulty turning over in bed (including adjusting bedclothes, sheets and blankets)?: None Difficulty moving from lying on back to sitting on the side of the bed? : None Difficulty sitting down on and standing up from a chair with arms (e.g., wheelchair, bedside commode, etc,.)?: A Little Help needed moving to and from a bed to chair (including a wheelchair)?: A Little Help needed walking in hospital room?: A Little Help needed climbing 3-5 steps with a railing? : A Little 6 Click Score: 20    End of Session   Activity Tolerance: Patient tolerated treatment well Patient left: in bed;with call bell/phone within reach;with SCD's reapplied Nurse Communication: Mobility status PT Visit Diagnosis: Other abnormalities of gait and mobility (R26.89);Muscle weakness (generalized) (M62.81)     Time: 6153-7943 PT Time Calculation (min)  (ACUTE ONLY): 20 min  Charges:  $Gait Training: 8-22 mins                    G Codes:       Governor Rooks, PTA pager [phone]    Cristela Blue 07/22/2017, 3:53 PM

## 2017-07-22 NOTE — Progress Notes (Signed)
Peripherally Inserted Central Catheter/Midline Placement  The IV Nurse has discussed with the patient and/or persons authorized to consent for the patient, the purpose of this procedure and the potential benefits and risks involved with this procedure.  The benefits include less needle sticks, lab draws from the catheter, and the patient may be discharged home with the catheter. Risks include, but not limited to, infection, bleeding, blood clot (thrombus formation), and puncture of an artery; nerve damage and irregular heartbeat and possibility to perform a PICC exchange if needed/ordered by physician.  Alternatives to this procedure were also discussed.  Bard Power PICC patient education guide, fact sheet on infection prevention and patient information card has been provided to patient /or left at bedside.    PICC/Midline Placement Documentation  PICC Double Lumen 04/88/89 PICC Right Basilic 40 cm 0 cm (Active)  Indication for Insertion or Continuance of Line Administration of hyperosmolar/irritating solutions (i.e. TPN, Vancomycin, etc.) 07/22/2017  5:00 PM  Exposed Catheter (cm) 0 cm 07/22/2017  5:00 PM  Site Assessment Intact;Bleeding 07/22/2017  5:00 PM  Lumen #1 Status Flushed;Blood return noted;Saline locked 07/22/2017  5:00 PM  Lumen #2 Status Flushed;Blood return noted;Saline locked 07/22/2017  5:00 PM  Dressing Type Gauze;Pressure 07/22/2017  5:00 PM  Dressing Status Clean;Dry;Intact;Antimicrobial disc in place 07/22/2017  5:00 PM  Line Care Connections checked and tightened 07/22/2017  5:00 PM  Line Adjustment (NICU/IV Team Only) No 07/22/2017  5:00 PM  Dressing Intervention New dressing 07/22/2017  5:00 PM  Dressing Change Due 07/24/17 07/22/2017  5:00 PM       Lorenza Cambridge 07/22/2017, 5:13 PM

## 2017-07-22 NOTE — Progress Notes (Signed)
[place] NOTE   Pharmacy Consult for TPN Indication: Prolonged Ileus  Patient Measurements: Height: 5\' 9"  (175.3 cm) Weight: 138 lb (62.6 kg) IBW/kg (Calculated) : 70.7 TPN AdjBW (KG): 62.6 Body mass index is 20.38 kg/m.  Assessment: 72 year old male who underwent partial cholecystectomy in late April for gangrenous cholecystitis in which the neck of the gallbladder was left due to severe inflammation and surgical drain was placed. He developed a postoperative bile leak and underwent ERCP, sphincterotomy and biliary stent placement that admission. The stent was removed 06/26/17 and since that time has had intermittent abdominal pain which became severe on 07/13/17 with hematemesis bringing him to the ER. CT scan demonstrated an SBO 2/2 to internal hernia. On 07/18/17, he underwent exploratory laparotomy with small bowel resection and repair os small bowel serosal tear as well as completion of partial cholecystectomy. Patient has been essentially NPO for 8 days.  GI: LBM 7/17 prior to surgery. + flatus.  NGT output yesterday was up to 1400 mL. Hoping to try clamping trials in next 24-48 hours.  Endo: Not been checked.  Insulin requirements in the past 24 hours: 0 units Lytes: Lytes on 7/20- K 3.8, Mg 2-after 4g given, and Phos low at 2.2- never replaced. CoCa 9.48. Others wnl. IVF- D5-NS + 20K at 125 mL/hr.  Renal: SCr 0.47- stable. BUN 5.  Pulm: RA Cards: HTN Hepatobil: AST mildly elevated on 7/14. Others wnl.  Neuro: Pain 4/10 today. Dilaudid PCA. ID: Zosyn day #9 - afebrile. WBC trending down on 7/20.   TPN Access: PICC 7/22 TPN start date: 7/22 Nutritional Goals (per RD recommendation on pending): kCal: Protein:  Fluid:  Goal TPN rate is 75 ml/hr (provides 100%)  Current Nutrition:  NPO  Plan:  Start TPN at 40 mL/hr. This TPN provides 54 g of protein, 163 g of dextrose, and 23 g of lipids which provides 1000 kCals per day, meeting  52% of patient needs. Electrolytes in TPN: Standard except incr Phos and Mg. Cl:Ac 1:1.  Add MVI, trace elements to TPN Discontinue IV Pepcid and add Pepcid to TPN Add Sensitive SSI and adjust as needed Reduce IVMF (D5-NS+20K) to 85 ml/hr at 1800 when new TPN hung. Monitor TPN labs F/u clamping trials and ability to initiate po diet.   KPhos 20 mmol IV x1 today as prior low Phos never replaced.   Sloan Leiter, PharmD, BCPS, BCCCP Clinical Pharmacist Clinical phone 07/22/2017 until 3:30PM [phone] Please refer to [facility] and [place] for pharmacist numbers 07/22/2017,11:19 AM

## 2017-07-23 DIAGNOSIS — E43 Unspecified severe protein-calorie malnutrition: Secondary | ICD-10-CM

## 2017-07-23 LAB — CBC
HCT: 20.4 % — ABNORMAL LOW (ref 39.0–52.0)
HEMATOCRIT: 19.9 % — AB (ref 39.0–52.0)
HEMOGLOBIN: 6.7 g/dL — AB (ref 13.0–17.0)
Hemoglobin: 6.4 g/dL — CL (ref 13.0–17.0)
MCH: 29 pg (ref 26.0–34.0)
MCH: 28.9 pg (ref 26.0–34.0)
MCHC: 32.2 g/dL (ref 30.0–36.0)
MCHC: 32.8 g/dL (ref 30.0–36.0)
MCV: 90 fL (ref 78.0–100.0)
MCV: 87.9 fL (ref 78.0–100.0)
Platelets: 267 10*3/uL (ref 150–400)
Platelets: 286 10*3/uL (ref 150–400)
RBC: 2.21 MIL/uL — AB (ref 4.22–5.81)
RBC: 2.32 MIL/uL — AB (ref 4.22–5.81)
RDW: 14.2 % (ref 11.5–15.5)
RDW: 14.2 % (ref 11.5–15.5)
WBC: 7.3 10*3/uL (ref 4.0–10.5)
WBC: 7.5 10*3/uL (ref 4.0–10.5)

## 2017-07-23 LAB — COMPREHENSIVE METABOLIC PANEL
ALK PHOS: 46 U/L (ref 38–126)
ALT: 14 U/L (ref 0–44)
AST: 17 U/L (ref 15–41)
Albumin: 1.8 g/dL — ABNORMAL LOW (ref 3.5–5.0)
Anion gap: 7 (ref 5–15)
CHLORIDE: 101 mmol/L (ref 98–111)
CO2: 27 mmol/L (ref 22–32)
CREATININE: 0.43 mg/dL — AB (ref 0.61–1.24)
Calcium: 7.9 mg/dL — ABNORMAL LOW (ref 8.9–10.3)
Glucose, Bld: 111 mg/dL — ABNORMAL HIGH (ref 70–99)
Potassium: 2.7 mmol/L — CL (ref 3.5–5.1)
Sodium: 135 mmol/L (ref 135–145)
Total Bilirubin: 0.3 mg/dL (ref 0.3–1.2)
Total Protein: 4.4 g/dL — ABNORMAL LOW (ref 6.5–8.1)

## 2017-07-23 LAB — MAGNESIUM: MAGNESIUM: 1.6 mg/dL — AB (ref 1.7–2.4)

## 2017-07-23 LAB — PREALBUMIN: Prealbumin: 12.1 mg/dL — ABNORMAL LOW (ref 18–38)

## 2017-07-23 LAB — DIFFERENTIAL
Abs Immature Granulocytes: 0 10*3/uL (ref 0.0–0.1)
BASOS ABS: 0 10*3/uL (ref 0.0–0.1)
Basophils Relative: 0 %
EOS ABS: 0.2 10*3/uL (ref 0.0–0.7)
EOS PCT: 2 %
IMMATURE GRANULOCYTES: 1 %
Lymphocytes Relative: 14 %
Lymphs Abs: 1 10*3/uL (ref 0.7–4.0)
Monocytes Absolute: 0.6 10*3/uL (ref 0.1–1.0)
Monocytes Relative: 8 %
NEUTROS PCT: 75 %
Neutro Abs: 5.5 10*3/uL (ref 1.7–7.7)

## 2017-07-23 LAB — GLUCOSE, CAPILLARY
GLUCOSE-CAPILLARY: 103 mg/dL — AB (ref 70–99)
GLUCOSE-CAPILLARY: 111 mg/dL — AB (ref 70–99)
GLUCOSE-CAPILLARY: 113 mg/dL — AB (ref 70–99)
GLUCOSE-CAPILLARY: 118 mg/dL — AB (ref 70–99)
Glucose-Capillary: 109 mg/dL — ABNORMAL HIGH (ref 70–99)

## 2017-07-23 LAB — TRIGLYCERIDES: Triglycerides: 67 mg/dL (ref <150)

## 2017-07-23 LAB — PHOSPHORUS: Phosphorus: 2.8 mg/dL (ref 2.5–4.6)

## 2017-07-23 LAB — PREPARE RBC (CROSSMATCH)

## 2017-07-23 MED ORDER — ZOLPIDEM TARTRATE 5 MG PO TABS
5.0000 mg | ORAL_TABLET | Freq: Every evening | ORAL | Status: DC | PRN
  Administered 2017-07-23 – 2017-08-01 (×10): 5 mg via ORAL
  Filled 2017-07-23 (×10): qty 1

## 2017-07-23 MED ORDER — KCL IN DEXTROSE-NACL 20-5-0.9 MEQ/L-%-% IV SOLN
INTRAVENOUS | Status: DC
  Administered 2017-07-23 – 2017-07-29 (×11): via INTRAVENOUS
  Filled 2017-07-23 (×11): qty 1000

## 2017-07-23 MED ORDER — TRACE MINERALS CR-CU-MN-SE-ZN 10-1000-500-60 MCG/ML IV SOLN
INTRAVENOUS | Status: DC
  Administered 2017-07-23: 18:00:00 via INTRAVENOUS
  Filled 2017-07-23: qty 672

## 2017-07-23 MED ORDER — SODIUM CHLORIDE 0.9% IV SOLUTION
Freq: Once | INTRAVENOUS | Status: AC
  Administered 2017-07-23: 11:00:00 via INTRAVENOUS

## 2017-07-23 MED ORDER — CLONIDINE HCL 0.1 MG/24HR TD PTWK
0.1000 mg | MEDICATED_PATCH | TRANSDERMAL | Status: DC
  Administered 2017-07-23 – 2017-07-30 (×2): 0.1 mg via TRANSDERMAL
  Filled 2017-07-23 (×2): qty 1

## 2017-07-23 MED ORDER — MAGNESIUM SULFATE 2 GM/50ML IV SOLN
2.0000 g | Freq: Once | INTRAVENOUS | Status: AC
  Administered 2017-07-23: 2 g via INTRAVENOUS
  Filled 2017-07-23: qty 50

## 2017-07-23 MED ORDER — METOPROLOL TARTRATE 5 MG/5ML IV SOLN
2.5000 mg | Freq: Four times a day (QID) | INTRAVENOUS | Status: DC
Start: 2017-07-23 — End: 2017-08-01
  Administered 2017-07-23 – 2017-08-01 (×36): 2.5 mg via INTRAVENOUS
  Filled 2017-07-23 (×35): qty 5

## 2017-07-23 MED ORDER — POTASSIUM CHLORIDE 10 MEQ/50ML IV SOLN
10.0000 meq | INTRAVENOUS | Status: AC
  Administered 2017-07-23 (×6): 10 meq via INTRAVENOUS
  Filled 2017-07-23 (×6): qty 50

## 2017-07-23 NOTE — Progress Notes (Addendum)
CRITICAL VALUE ALERT  Critical Value:  Potassium 2.7  Date & Time Notied: 07/23/17 @ 0800  Provider Notified: Modena Jansky PA  Orders Received/Actions taken: awaiting orders and pharmacist also made aware

## 2017-07-23 NOTE — Progress Notes (Signed)
CRITICAL VALUE ALERT  Critical Value: Hgb: 6.4  Date & Time Notied:  07/23/17 @ 0715  Provider Notified: Modena Jansky PA  Orders Received/Actions taken: repeat CBC stat

## 2017-07-23 NOTE — Progress Notes (Addendum)
5 Days Post-Op    CC: Abdominal pain/SBO  Subjective: Though having some pain.  Especially with coughing.  Says abdominal midline incision looks fine.  The open site is clean and packed.  His JP drain is bloody, and he says that has looked that way consistently since his surgery.  He has had some flatus but no stool.  Still having a good deal of NG drainage, that is green in color.  He was up walking some yesterday, says he is sleeping poorly with the alarms on the PCA going off.  He is still having enough pain that he would like to continue the PCA.  Objective: Vital signs in last 24 hours: Temp:  [98.4 F (36.9 C)-99.2 F (37.3 C)] 99.1 F (37.3 C) (07/23 0419) Pulse Rate:  [76-92] 86 (07/23 0419) Resp:  [7-20] 14 (07/23 0757) BP: (145-164)/(75-89) 164/88 (07/23 0419) SpO2:  [95 %-100 %] 97 % (07/23 0757) Last BM Date: 07/17/17 160 p.o. 2700 IV 3425 urine 1825 NG Drain 52 No BM recorded Afebrile T-max 99.2, vital signs are stable.  Still intermittently on a nasal cannula Potassium 2.7, magnesium 1.6 Pre-albumin 12.1 WBC seven-point 3 repeat 7.5. H/H:  6.4/19.9 -repeat 6.7/20.4 Platelets stable -267/286 Intake/Output from previous day: 07/22 0701 - 07/23 0700 In: 2808.8 [P.O.:160; I.V.:2585.3; IV Piggyback:63.5] Out: 9326 [Urine:3425; Emesis/NG output:1825; Drains:52] Intake/Output this shift: No intake/output data recorded.  General appearance: alert, cooperative, no distress and Chronically ill, and cachectic. Resp: clear to auscultation bilaterally and He is moving between 1200 and 1500 cc on incentive spirometry. GI: Soft, sore, he has a few bowel sounds and some flatus, no BM.  Midline surgical wound is open at one point and is packed wet-to-dry.  This is all clean and looks fine.  The drainage from the JP is still bloody in appearance.  Lab Results:  Recent Labs    07/23/17 0702 07/23/17 0753  WBC 7.3 7.5  HGB 6.4* 6.7*  HCT 19.9* 20.4*  PLT 267 286     BMET Recent Labs    07/23/17 0702  NA 135  K 2.7*  CL 101  CO2 27  GLUCOSE 111*  BUN <5*  CREATININE 0.43*  CALCIUM 7.9*   PT/INR No results for input(s): LABPROT, INR in the last 72 hours.  Recent Labs  Lab 07/23/17 0702  AST 17  ALT 14  ALKPHOS 46  BILITOT 0.3  PROT 4.4*  ALBUMIN 1.8*     Lipase     Component Value Date/Time   LIPASE 21 07/14/2017 0943     Medications: . amLODipine  5 mg Oral Daily  . carvedilol  25 mg Oral BID WC  . chlorhexidine  15 mL Mouth Rinse BID  . cloNIDine  0.1 mg Oral TID  . enoxaparin (LOVENOX) injection  40 mg Subcutaneous Q24H  . gabapentin  800 mg Oral TID  . guaiFENesin  15 mL Oral TID  . hydrochlorothiazide  25 mg Oral Daily  . HYDROmorphone   Intravenous Q4H  . insulin aspart  0-9 Units Subcutaneous Q6H  . mouth rinse  15 mL Mouth Rinse q12n4p  . nicotine  14 mg Transdermal Daily  . tamsulosin  0.4 mg Oral Daily   Anti-infectives (From admission, onward)   Start     Dose/Rate Route Frequency Ordered Stop   07/14/17 0900  piperacillin-tazobactam (ZOSYN) IVPB 3.375 g     3.375 g 12.5 mL/hr over 240 Minutes Intravenous Every 8 hours 07/14/17 0818  Assessment/Plan HTN -Norvasc p.o., Catapres p.o. Hctz PO, Coreg  Chronic anemia  H/H11/33.6 (7/20)  >>  6.4/19.9 (7/23) - transfusing 1 unit PRBC Tobacco use Chronic pain - robaxin, gabapentin RUL PNA - per CCM can transition to Augmentin to complete a 14 d total course Abx  Severe malnutrition - TNA started 07/22/17 Hypokalemia/Hypomagnesemia - Pharmacy replacing   pSBO 2/2 internal hernia s/p Laparoscopic lysis of adhesions, Conversion to exploratory laparotomy with small bowel resection and primary repair of small bowel serosal tear, Completion of prior partial cholecystectomy. 7/18 Dr. Redmond Pulling -  POD#5 -  Afebrile, VSS  -  NG tube with 1825 cc/24h;  Flatus, some bowel sounds,No BM. hopefully can start clamping trials in next 24-48h -  Continue wet to dry  dressings to midline wound -  Continue JP drain  -  Mobilize/IS - 1200 - 1500 cc on IS  FEN: NPO, NGT to LIWS, start TNA; PCA for pain  ID: Zosyn 7/14 >> (# 10); plan to transition to Augmentin when tolerating PO VTE: SCD's, Lovenox Foley: none  Follow up:  Dr. Redmond Pulling  Plan: Still putting out a lot through his NG, he only has 160 p.o. recorded so most of this is, and appears to be gastric output.  He is having some flatus but no BM.  Potassium, magnesium, hemoglobin and hematocrit are all low.  Pharmacy is replacing the potassium and magnesium, and we are transfusing 1 unit packed RBCs today.  Continue to mobilize.  He has had his Lovenox this morning, will discuss holding this with Dr. Excell Seltzer. I'm going to stop the PO meds and give him Transdermal and IV meds.  He will need them restarted when he is back on PO's.    Holding AM lasix in the AM until we see H/H results.    LOS: 9 days    [name] 07/23/2017 [phone]

## 2017-07-23 NOTE — Progress Notes (Signed)
[place] CONSULT NOTE   Pharmacy Consult for TPN Indication: Prolonged Ileus  Patient Measurements: Height: _0  (175.3 cm) Weight: 138 lb (62.6 kg) IBW/kg (Calculated) : 70.7 TPN AdjBW (KG): 62.6 Body mass index is 20.38 kg/m.  Assessment: 72 year old male who underwent partial cholecystectomy in late April for gangrenous cholecystitis in which the neck of the gallbladder was left due to severe inflammation and surgical drain was placed. He developed a postoperative bile leak and underwent ERCP, sphincterotomy and biliary stent placement that admission. The stent was removed 06/26/17 and since that time has had intermittent abdominal pain which became severe on 07/13/17 with hematemesis bringing him to the ER. CT scan demonstrated an SBO 2/2 to internal hernia. On 07/18/17, he underwent exploratory laparotomy with small bowel resection and repair os small bowel serosal tear as well as completion of partial cholecystectomy. Patient has been essentially NPO for 8 days.  GI: Prealbumin 12.1. LBM 7/17 prior to surgery. + flatus. NGT output yesterday was up to 2100 mL, drain output 50 ml. Hoping to try clamping trials in next 24-48 hours Endo: BG controlled on Q6h sensitive SSI  Insulin requirements in the past 24 hours: 0 units Lytes: K 2.7, Mg 1.6, Phos up to 2.8 - s/p KPhos 83mol x 1. CoCa 9.6 Others wnl. IVF- D5-NS + 20K at 85 mL/hr.  Renal: SCr 0.43- stable. flomax Pulm: RA. mucinex Heme/Onc: low Hg 6.4 - transfusing 7/23 Cards: HTN - norvasc, coreg, clonidine, hctz Hepatobil: LFTs / tbili / TG wnl Neuro: Pain 4/10 today. Dilaudid PCA, gaba. Nicotine patch ID: Zosyn day #10 - afebrile. WBC WNL. BAL negative  TPN Access: PICC 7/22 TPN start date: 7/22 Nutritional Goals (per RD recommendation on pending): KCal: 1900-2100 Protein: 90-105 g Fluid: >1.9L  Goal TPN rate is 75 ml/hr (provides 100%)  Current Nutrition:  NPO  Plan:  Increase TPN to  50 mL/hr - conservative increase with patient at risk of refeeding This TPN provides 67 g of protein, 204 g of dextrose, and 29 g of lipids which provides 1250 kCals per day, meeting 74% of protein and 66% of kCal needs. Electrolytes in TPN: Incr Phos/Mg 7/22, Incr K/Mag 7/23. Cl:Ac 1:1.  Add MVI, trace elements to TPN Add Pepcid to TPN Continue Sensitive Q6h SSI and adjust as needed Reduce IVMF (D5-NS+20K) to 75 ml/hr at 1800 when new TPN hung. Monitor TPN labs F/u clamping trials and ability to initiate po diet.   Give mag sulfate 2g IV x 1 + K runs x 6  HElicia Lamp PharmD, BCPS Clinical Pharmacist Clinical phone [phone] check AMION for all [place]contact numbers 07/23/2017 8:55 AM

## 2017-07-23 NOTE — Care Management Note (Addendum)
Case Management Note  Patient Details  Name: Gerald Farrell MRN: [id number] Date of Birth: 14-Feb-1945  Subjective/Objective:                 Patient admitted from home with children.   7/18 had small bowel resection with repair of tear and completion of prior partial chole.  Has open midline incision that is packed.  Has NG that is still draining green. Has PCA for pain control. Patient receiving blood transfusion today.  Patient has TPN. CM will continue to follow as patient progresses to assist with DC needs.       Action/Plan:   Expected Discharge Date:                  Expected Discharge Plan:  [place]  In-House Referral:     Discharge planning Services  CM Consult  Post Acute Care Choice:    Choice offered to:     DME Arranged:    DME Agency:     HH Arranged:    [place] Agency:     Status of Service:  In process, will continue to follow  If discussed at Long Length of Stay Meetings, dates discussed:    Additional Comments:  Carles Collet, RN 07/23/2017, 2:08 PM

## 2017-07-23 NOTE — Progress Notes (Signed)
OT Cancellation Note  Patient Details Name: Gerald Farrell MRN: [id number] DOB: Aug 01, 1945   Cancelled Treatment:    Reason Eval/Treat Not Completed: Medical issues which prohibited therapy. Pt currently receiving blood and with low potassium this am. Spoke with RN who requests OT hold this date and will check back as able.   [name], MS OTR/L  Pager: [phone]   [name] 07/23/2017, 11:36 AM

## 2017-07-24 LAB — CBC
HCT: 23.7 % — ABNORMAL LOW (ref 39.0–52.0)
Hemoglobin: 7.8 g/dL — ABNORMAL LOW (ref 13.0–17.0)
MCH: 29 pg (ref 26.0–34.0)
MCHC: 32.9 g/dL (ref 30.0–36.0)
MCV: 88.1 fL (ref 78.0–100.0)
Platelets: 289 10*3/uL (ref 150–400)
RBC: 2.69 MIL/uL — ABNORMAL LOW (ref 4.22–5.81)
RDW: 14.7 % (ref 11.5–15.5)
WBC: 7.3 10*3/uL (ref 4.0–10.5)

## 2017-07-24 LAB — GLUCOSE, CAPILLARY
GLUCOSE-CAPILLARY: 113 mg/dL — AB (ref 70–99)
GLUCOSE-CAPILLARY: 111 mg/dL — AB (ref 70–99)
Glucose-Capillary: 117 mg/dL — ABNORMAL HIGH (ref 70–99)
Glucose-Capillary: 116 mg/dL — ABNORMAL HIGH (ref 70–99)

## 2017-07-24 LAB — BASIC METABOLIC PANEL
Anion gap: 5 (ref 5–15)
BUN: 7 mg/dL — AB (ref 8–23)
CO2: 29 mmol/L (ref 22–32)
CREATININE: 0.39 mg/dL — AB (ref 0.61–1.24)
Calcium: 8 mg/dL — ABNORMAL LOW (ref 8.9–10.3)
Chloride: 103 mmol/L (ref 98–111)
GFR calc Af Amer: >60 mL/min (ref >60)
Glucose, Bld: 107 mg/dL — ABNORMAL HIGH (ref 70–99)
POTASSIUM: 3.4 mmol/L — AB (ref 3.5–5.1)
SODIUM: 137 mmol/L (ref 135–145)

## 2017-07-24 LAB — MAGNESIUM: MAGNESIUM: 2.2 mg/dL (ref 1.7–2.4)

## 2017-07-24 LAB — PHOSPHORUS: Phosphorus: 3 mg/dL (ref 2.5–4.6)

## 2017-07-24 MED ORDER — TRAVASOL 10 % IV SOLN
INTRAVENOUS | Status: DC
  Administered 2017-07-24: 11:00:00 via INTRAVENOUS
  Filled 2017-07-24: qty 1008

## 2017-07-24 MED ORDER — ENOXAPARIN SODIUM 40 MG/0.4ML ~~LOC~~ SOLN
40.0000 mg | SUBCUTANEOUS | Status: DC
  Administered 2017-07-24 – 2017-08-02 (×10): 40 mg via SUBCUTANEOUS
  Filled 2017-07-24 (×10): qty 0.4

## 2017-07-24 MED ORDER — POTASSIUM CHLORIDE 10 MEQ/50ML IV SOLN
10.0000 meq | INTRAVENOUS | Status: AC
  Administered 2017-07-24 (×3): 10 meq via INTRAVENOUS
  Filled 2017-07-24 (×3): qty 50

## 2017-07-24 MED ORDER — LORAZEPAM 2 MG/ML IJ SOLN
0.5000 mg | Freq: Four times a day (QID) | INTRAMUSCULAR | Status: DC | PRN
  Administered 2017-07-24 – 2017-07-31 (×21): 0.5 mg via INTRAVENOUS
  Filled 2017-07-24 (×21): qty 1

## 2017-07-24 MED ORDER — TRACE MINERALS CR-CU-MN-SE-ZN 10-1000-500-60 MCG/ML IV SOLN
INTRAVENOUS | Status: DC
  Administered 2017-07-24: 18:00:00 via INTRAVENOUS
  Filled 2017-07-24: qty 1008

## 2017-07-24 NOTE — Progress Notes (Signed)
[place] CONSULT NOTE   Pharmacy Consult for TPN Indication: Prolonged Ileus  Patient Measurements: Height: _0  (175.3 cm) Weight: 138 lb (62.6 kg) IBW/kg (Calculated) : 70.7 TPN AdjBW (KG): 62.6 Body mass index is 20.38 kg/m.  Assessment: 72 year old male who underwent partial cholecystectomy in late April for gangrenous cholecystitis in which the neck of the gallbladder was left due to severe inflammation and surgical drain was placed. He developed a postoperative bile leak and underwent ERCP, sphincterotomy and biliary stent placement that admission. The stent was removed 06/26/17 and since that time has had intermittent abdominal pain which became severe on 07/13/17 with hematemesis bringing him to the ER. CT scan demonstrated an SBO 2/2 to internal hernia. On 07/18/17, he underwent exploratory laparotomy with small bowel resection and repair os small bowel serosal tear as well as completion of partial cholecystectomy. Patient has been essentially NPO for 8 days.  GI: Prealbumin 12.1. LBM 7/17 prior to surgery. + flatus. NGT output yesterday was 1425 mL, drain output 32 ml. Hoping to try clamping trials in next 24-48 hours Endo: BG controlled on Q6h sensitive SSI  Insulin requirements in the past 24 hours: 0 units Lytes: K 3.4 improved but still low after 55mq, Mg 2.2, Phos up to 3.0. CoCa 9.6 IVF- D5-NS + 20K at 75 mL/hr.  Renal: SCr 0.39- stable. flomax Pulm: RA. mucinex Heme/Onc: low Hg still low but improved 7.8 - s/p transfusion 7/23 Cards: HTN - norvasc, coreg, clonidine, hctz Hepatobil: LFTs / tbili / TG wnl Neuro: Pain 4/10 today. Dilaudid PCA, gaba. Nicotine patch ID: Zosyn day #11 - afebrile. WBC WNL. BAL negative  TPN Access: PICC 7/22 TPN start date: 7/22 Nutritional Goals (per RD recommendation on pending): KCal: 1900-2100 Protein: 90-105 g Fluid: >1.9L  Goal TPN rate is 75 ml/hr (provides 100%)  Current Nutrition:   NPO  Plan:  Increase TPN to 75 mL/hr  This TPN provides 100.8 g of protein, 306 g of dextrose, and 43.2 g of lipids which provides 1875 kCals per day, meeting 100% of protein and 99% of kCal needs. Electrolytes in TPN: Maintain current regimen Add MVI, trace elements to TPN Add Pepcid to TPN Continue Sensitive Q6h SSI and adjust as needed Reduce IVMF (D5-NS+20K) to 75 ml/hr at 1800 when new TPN hung. Monitor TPN labs F/u clamping trials and ability to initiate po diet.   NRober Minion PharmD., MS Clinical Pharmacist Pager:  [phone] you for allowing pharmacy to be part of this patients care team. Please check AMION for all MB[place]ardcontact numbers 07/24/2017 10:14 AM

## 2017-07-24 NOTE — Progress Notes (Signed)
Physical Therapy Treatment Patient Details Name: Gerald Farrell MRN: [id number] DOB: 1945-07-09 Today's Date: 07/24/2017    History of Present Illness 72 y.o. male admitted with SBO and hematemesis.   He underwent open lysis of adhesions, Small bowel resection, completion of chelcystectomy on 7/18. He was found to have lung abcess on CT and underwent brochoscopy.    PMH includes:  ETOH abuse, basal cell carcinoma, HTN, acute cholecystitis, s/p lumbar laminectomy,  s/p TKA     PT Comments    Continuing work on functional mobility and activity tolerance;  Needs reinforcement in using log roll technique to go from sit to supine; Patient requested oxygen to be placed upon therapist exit due to anxiety.  Follow Up Recommendations  No PT follow up;Supervision for mobility/OOB     Equipment Recommendations  None recommended by PT    Recommendations for Other Services       Precautions / Restrictions Restrictions Weight Bearing Restrictions: No    Mobility  Bed Mobility Overal bed mobility: Needs Assistance Bed Mobility: Sit to Supine     Supine to sit: HOB elevated;Supervision Sit to supine: Mod assist   General bed mobility comments: Gerald Farrell turned form sitting EOB to long-sitting in the bed before I had the opportunity to describe going to sidelying and log rolling to lay down without putting too much work on the abdomen; used HOB to ease him back to supine  Transfers Overall transfer level: Needs assistance Equipment used: Rolling walker (2 wheeled) Transfers: Sit to/from Stand Sit to Stand: Supervision         General transfer comment: Supervision for safety and lines  Ambulation/Gait Ambulation/Gait assistance: Supervision Gait Distance (Feet): 550 Feet Assistive device: Rolling walker (2 wheeled) Gait Pattern/deviations: Step-through pattern;Trunk flexed     General Gait Details: Cues for upper trunk control, cues for pacing/breathing.  Described need to  gently work in trunk extension as tolerated for optimal healing   Marine scientist Gerald Farrell (Stroke Patients Only)       Balance Overall balance assessment: Mild deficits observed, not formally tested                                          Cognition Arousal/Alertness: Awake/alert Behavior During Therapy: WFL for tasks assessed/performed Overall Cognitive Status: Within Functional Limits for tasks assessed                                        Exercises      General Comments General comments (skin integrity, edema, etc.): VSS, oxygen removed and NG tube clamped by RN during session; saturations maintained at 98% with cueing for pursed lip breathing       Pertinent Vitals/Pain Pain Assessment: Faces Faces Pain Scale: Hurts little more Pain Location: abdomen Pain Descriptors / Indicators: Discomfort;Operative site guarding Pain Intervention(s): Monitored during session;PCA encouraged    Home Living                      Prior Function            PT Goals (current goals can now be found in the care plan section) Acute Rehab PT Goals Patient Stated Goal:  to get back to yoga  PT Goal Formulation: With patient Time For Goal Achievement: 07/30/17 Potential to Achieve Goals: Fair Progress towards PT goals: Progressing toward goals    Frequency    Min 3X/week      PT Plan Current plan remains appropriate    Co-evaluation              AM-PAC PT "6 Clicks" Daily Activity  Outcome Measure  Difficulty turning over in bed (including adjusting bedclothes, sheets and blankets)?: None Difficulty moving from lying on back to sitting on the side of the bed? : None Difficulty sitting down on and standing up from a chair with arms (e.g., wheelchair, bedside commode, etc,.)?: A Little Help needed moving to and from a bed to chair (including a wheelchair)?: A Little Help needed  walking in hospital room?: A Little Help needed climbing 3-5 steps with a railing? : A Little 6 Click Score: 20    End of Session Equipment Utilized During Treatment: Gait belt Activity Tolerance: Patient tolerated treatment well Patient left: in bed;with call bell/phone within reach Nurse Communication: Mobility status PT Visit Diagnosis: Other abnormalities of gait and mobility (R26.89);Muscle weakness (generalized) (M62.81)     Time: 1610-9604 PT Time Calculation (min) (ACUTE ONLY): 26 min  Charges:  $Gait Training: 23-37 mins                    G Codes:       Gerald Farrell, [place]  Acute Rehabilitation Services Pager [phone] Office [place] 07/24/2017, 1:24 PM

## 2017-07-24 NOTE — Progress Notes (Signed)
Occupational Therapy Treatment Patient Details Name: Gerald Farrell MRN: [id number] DOB: [birth year] Today's Date: 07/24/2017    History of present illness 72 y.o. male admitted with SBO and hematemesis.   He underwent open lysis of adhesions, Small bowel resection, completion of chelcystectomy on 7/18. He was found to have lung abcess on CT and underwent brochoscopy.    PMH includes:  ETOH abuse, basal cell carcinoma, HTN, acute cholecystitis, s/p lumbar laminectomy,  s/p TKA    OT comments  Patient progressing well. Continues to be limited by pain in abdomen, but demonstrated ability to complete functional mobility using RW, toileting, transfers and grooming with min guard to close supervision assistance today.  He required cueing for pacing and pursed lip breathing, but oxygen saturations maintained throughout session on RA.  Patient requested oxygen to be placed upon therapist exit due to anxiety. Intermittent cueing for safety and walker management.  Plan to address LB self care next session.  Will continue to follow while admitted.    Follow Up Recommendations  No OT follow up;Supervision/Assistance - 24 hour    Equipment Recommendations  None recommended by OT    Recommendations for Other Services      Precautions / Restrictions Restrictions Weight Bearing Restrictions: No       Mobility Bed Mobility Overal bed mobility: Needs Assistance Bed Mobility: Supine to Sit     Supine to sit: HOB elevated;Supervision        Transfers Overall transfer level: Needs assistance Equipment used: Rolling walker (2 wheeled) Transfers: Sit to/from Stand Sit to Stand: Min guard         General transfer comment: min guard for safety, no physical assistance required    Balance Overall balance assessment: Mild deficits observed, not formally tested                                         ADL either performed or assessed with clinical judgement   ADL Overall  ADL's : Needs assistance/impaired     Grooming: Wash/dry hands;Min guard;Supervision/safety;Standing Grooming Details (indicate cue type and reason): min guard initially fading to supervision                  Toilet Transfer: Min guard;Ambulation;Grab bars;RW;BSC(simulated to recliner ) Toilet Transfer Details (indicate cue type and reason): cueing for pacing and safety/management of walker  Toileting- Clothing Manipulation and Hygiene: Min guard;Sit to/from stand;Cueing for safety Toileting - Clothing Manipulation Details (indicate cue type and reason): standing with min guard using urinal      Functional mobility during ADLs: Min guard;Rolling walker General ADL Comments: cueing for safety and pacing      Vision       Perception     Praxis      Cognition Arousal/Alertness: Awake/alert Behavior During Therapy: WFL for tasks assessed/performed Overall Cognitive Status: Within Functional Limits for tasks assessed                                          Exercises     Shoulder Instructions       General Comments VSS, oxygen removed and NG tube clamped by RN during session; saturations maintained at 98% with cueing for pursed lip breathing     Pertinent Vitals/ Pain  Pain Assessment: Faces Faces Pain Scale: Hurts even more Pain Location: abdomen Pain Descriptors / Indicators: Discomfort;Operative site guarding Pain Intervention(s): Limited activity within patient's tolerance;Repositioned;Monitored during session  Home Living                                          Prior Functioning/Environment              Frequency  Min 2X/week        Progress Toward Goals  OT Goals(current goals can now be found in the care plan section)  Progress towards OT goals: Progressing toward goals  Acute Rehab OT Goals Patient Stated Goal: to get back to yoga  OT Goal Formulation: With patient Time For Goal Achievement:  08/03/17 Potential to Achieve Goals: Good  Plan Discharge plan remains appropriate;Frequency remains appropriate    Co-evaluation                 AM-PAC PT "6 Clicks" Daily Activity     Outcome Measure   Help from another person eating meals?: None Help from another person taking care of personal grooming?: None Help from another person toileting, which includes using toliet, bedpan, or urinal?: A Little Help from another person bathing (including washing, rinsing, drying)?: A Lot Help from another person to put on and taking off regular upper body clothing?: A Little Help from another person to put on and taking off regular lower body clothing?: A Lot 6 Click Score: 18    End of Session Equipment Utilized During Treatment: Rolling walker  OT Visit Diagnosis: Pain Pain - part of body: (abdomen )   Activity Tolerance Patient limited by pain   Patient Left in chair;with call bell/phone within reach   Nurse Communication Mobility status;Other (comment)(pt needs )        Time: 2035-5974 OT Time Calculation (min): 26 min  Charges: OT General Charges $OT Visit: 1 Visit OT Treatments $Self Care/Home Management : 23-37 mins  Delight Stare, OTR/L  Pager [place] 07/24/2017, 11:00 AM

## 2017-07-24 NOTE — Progress Notes (Addendum)
[facility] Progress Note  6 Days Post-Op  Subjective: CC:  Reports feeling better compared to yesterday, currently up in the chair working with therapies. States he feels like he is going to have a BM soon. Again endorses poor sleep and anxiety. +Flatus.    Objective: Vital signs in last 24 hours: Temp:  [98 F (36.7 C)-99.5 F (37.5 C)] 98.8 F (37.1 C) (07/24 0528) Pulse Rate:  [74-86] 86 (07/24 0528) Resp:  [12-18] 13 (07/24 0801) BP: (110-194)/(67-103) 154/80 (07/24 0528) SpO2:  [100 %] 100 % (07/24 0801) Last BM Date: 07/17/17  Intake/Output from previous day: 07/23 0701 - 07/24 0700 In: 2504.8 [P.O.:140; I.V.:1714.4; Blood:300; IV Piggyback:350.5] Out: 8469 [Urine:2300; Emesis/NG output:1300; Drains:20] Intake/Output this shift: Total I/O In: -  Out: 200 [Urine:200]  PE: Gen:  Alert, NAD, pleasant and cooperative  Card:  Regular rate and rhythm Pulm:  Normal effort Abd: Soft, appropriately tender, non-distended, bowel sounds present, JP w/ some sanguinous drainage, midline dressing clean and dry.  Skin: warm and dry, no rashes  Psych: A&Ox3    Lab Results:  Recent Labs    07/23/17 0753 07/24/17 0419  WBC 7.5 7.3  HGB 6.7* 7.8*  HCT 20.4* 23.7*  PLT 286 289   BMET Recent Labs    07/23/17 0702 07/24/17 0419  NA 135 137  K 2.7* 3.4*  CL 101 103  CO2 27 29  GLUCOSE 111* 107*  BUN <5* 7*  CREATININE 0.43* 0.39*  CALCIUM 7.9* 8.0*   CMP     Component Value Date/Time   NA 137 07/24/2017 0419   K 3.4 (L) 07/24/2017 0419   CL 103 07/24/2017 0419   CO2 29 07/24/2017 0419   GLUCOSE 107 (H) 07/24/2017 0419   BUN 7 (L) 07/24/2017 0419   CREATININE 0.39 (L) 07/24/2017 0419   CALCIUM 8.0 (L) 07/24/2017 0419   PROT 4.4 (L) 07/23/2017 0702   ALBUMIN 1.8 (L) 07/23/2017 0702   AST 17 07/23/2017 0702   ALT 14 07/23/2017 0702   ALKPHOS 46 07/23/2017 0702   BILITOT 0.3 07/23/2017 0702   GFRNONAA >60 07/24/2017 0419   GFRAA >60 07/24/2017  0419   Lipase     Component Value Date/Time   LIPASE 21 07/14/2017 0943    Anti-infectives: Anti-infectives (From admission, onward)   Start     Dose/Rate Route Frequency Ordered Stop   07/14/17 0900  piperacillin-tazobactam (ZOSYN) IVPB 3.375 g     3.375 g 12.5 mL/hr over 240 Minutes Intravenous Every 8 hours 07/14/17 0818       Assessment/Plan HTN -Norvasc p.o., Catapres p.o. Hctz PO, Coreg  Acute on chronic anemia -hgb/hct 7.8/23.7 from 6.4/19.9 (7/23) - s/p 1 unit PRBC 7/23 Tobacco use Chronic pain - robaxin, gabapentin RUL PNA - per CCM can transition to Augmentin to complete a 14 d total course Abx  Severe malnutrition - TNA started 07/22/17  pSBO 2/2 internal hernia s/pLaparoscopic lysis of adhesions,Conversion to exploratory laparotomy with small bowel resection and primary repair of small bowel serosal tear,Completion of prior partial cholecystectomy.7/18 Dr. Redmond Pulling - POD#6 - Afebrile, VSS  - NG tube with 1300 cc/24h;  Flatus, some bowel sounds,No BM. hopefully can start clamping trials in next 24-48h - Continue wet to dry dressings to midline wound - Continue JP drain  - Mobilize/IS  FEN: NPO, NGT to LIWS,TNA; PCA for pain; Mg/Phos normalized; hypokalemia (3.4) replace per pharmacy  ID: Zosyn 7/14 >> (# 11); plan to transition to Augmentin when tolerating PO  VTE: SCD's, Lovenox Foley:none Follow up:  Dr. Redmond Pulling   Plan: continue therapies, resume Lovenox, add PRN 0.5 mg Ativan for anxiety, await further bowel function.       LOS: 10 days    Jill Alexanders , [facility] 07/24/2017, 10:04 AM Pager: [phone] Consults: [phone] Mon-Fri 7:00 am-4:30 pm Sat-Sun 7:00 am-11:30 am

## 2017-07-25 LAB — CBC
HCT: 23.6 % — ABNORMAL LOW (ref 39.0–52.0)
Hemoglobin: 7.5 g/dL — ABNORMAL LOW (ref 13.0–17.0)
MCH: 28.8 pg (ref 26.0–34.0)
MCHC: 31.8 g/dL (ref 30.0–36.0)
MCV: 90.8 fL (ref 78.0–100.0)
PLATELETS: 325 10*3/uL (ref 150–400)
RBC: 2.6 MIL/uL — AB (ref 4.22–5.81)
RDW: 15.3 % (ref 11.5–15.5)
WBC: 9.3 10*3/uL (ref 4.0–10.5)

## 2017-07-25 LAB — COMPREHENSIVE METABOLIC PANEL
ALT: 11 U/L (ref 0–44)
ANION GAP: 3 — AB (ref 5–15)
AST: 14 U/L — ABNORMAL LOW (ref 15–41)
Albumin: 1.8 g/dL — ABNORMAL LOW (ref 3.5–5.0)
Alkaline Phosphatase: 60 U/L (ref 38–126)
BILIRUBIN TOTAL: 0.5 mg/dL (ref 0.3–1.2)
BUN: 12 mg/dL (ref 8–23)
CO2: 26 mmol/L (ref 22–32)
Calcium: 7.9 mg/dL — ABNORMAL LOW (ref 8.9–10.3)
Chloride: 106 mmol/L (ref 98–111)
Creatinine, Ser: 0.42 mg/dL — ABNORMAL LOW (ref 0.61–1.24)
GFR calc Af Amer: >60 mL/min (ref >60)
GFR calc non Af Amer: >60 mL/min (ref >60)
Glucose, Bld: 109 mg/dL — ABNORMAL HIGH (ref 70–99)
POTASSIUM: 3.9 mmol/L (ref 3.5–5.1)
Sodium: 135 mmol/L (ref 135–145)
TOTAL PROTEIN: 4.5 g/dL — AB (ref 6.5–8.1)

## 2017-07-25 LAB — GLUCOSE, CAPILLARY
GLUCOSE-CAPILLARY: 100 mg/dL — AB (ref 70–99)
GLUCOSE-CAPILLARY: 101 mg/dL — AB (ref 70–99)
GLUCOSE-CAPILLARY: 104 mg/dL — AB (ref 70–99)
Glucose-Capillary: 101 mg/dL — ABNORMAL HIGH (ref 70–99)

## 2017-07-25 LAB — PHOSPHORUS: PHOSPHORUS: 2.7 mg/dL (ref 2.5–4.6)

## 2017-07-25 LAB — MAGNESIUM: MAGNESIUM: 2.2 mg/dL (ref 1.7–2.4)

## 2017-07-25 MED ORDER — TRACE MINERALS CR-CU-MN-SE-ZN 10-1000-500-60 MCG/ML IV SOLN
INTRAVENOUS | Status: AC
  Administered 2017-07-25: 18:00:00 via INTRAVENOUS
  Filled 2017-07-25: qty 1008

## 2017-07-25 NOTE — Progress Notes (Signed)
[facility] CONSULT NOTE   Pharmacy Consult for TPN Indication: Prolonged Ileus  Patient Measurements: Height: _0  (175.3 cm) Weight: 138 lb (62.6 kg) IBW/kg (Calculated) : 70.7 TPN AdjBW (KG): 62.6 Body mass index is 20.38 kg/m.  Assessment: 72 year old male who underwent partial cholecystectomy in late April for gangrenous cholecystitis in which the neck of the gallbladder was left due to severe inflammation and surgical drain was placed. He developed a postoperative bile leak and underwent ERCP, sphincterotomy and biliary stent placement that admission. The stent was removed 06/26/17 and since that time has had intermittent abdominal pain which became severe on 07/13/17 with hematemesis bringing him to the ER. CT scan demonstrated an SBO 2/2 to internal hernia. On 07/18/17, he underwent exploratory laparotomy with small bowel resection and repair of small bowel serosal tear as well as completion of partial cholecystectomy. Patient has been essentially NPO for 8 days.  GI: Prealbumin 12.1 (7/23). LBM 7/17 prior to surgery. + flatus. NGT output yesterday was 1425>300 mL down, drain output 0. Famotidine in TPN. Awaiting return of bowel function  Endo: BG wellcontrolled on Q6h sensitive SSI  Insulin requirements in the past 24 hours: 0 units  Lytes: K 3.9 replaced, Mg 2.2, Phos 2.7. CoCa 9.6 IVF- D5-NS + 20K at 75 mL/hr.   Renal: SCr 0.42- stable. flomax  Pulm: h/o tobacco on nicotine patch. 2LNC. Robitussin  Heme/Onc: low Hg still low 7.5 - s/p transfusion 7/23  Cards: HTN - Clonidine patch, IV metoprolol (norvasc, coreg, hctz-hold)  Hepatobil: LFTs / tbili / TG wnl 67  Neuro: Chronic pain, Dilaudid PCA, Nicotine patch (Gabapentin off). Anxiety  ID: Zosyn day #12 for PNA - afebrile. WBC WNL. BAL negative  TPN Access: PICC 7/22 TPN start date: 7/22 Nutritional Goals (per RD recommendation on pending): KCal: 1900-2100 Protein: 90-105 g Fluid:  >1.9L  Goal TPN rate is 75 ml/hr (provides 100%)  Current Nutrition:  NPO  Plan:  Increase TPN to 75 mL/hr  This TPN provides 100.8 g of protein, 306 g of dextrose, and 43.2 g of lipids which provides 1875 kCals per day, meeting 100% of protein and 99% of kCal needs. Electrolytes in TPN: Maintain current regimen Add MVI, trace elements to TPN Add Pepcid to TPN Continue Sensitive Q6h SSI and adjust as needed Reduce IVMF (D5-NS+20K) to 75 ml/hr at 1800 when new TPN hung. Monitor TPN labs F/u clamping trials and ability to initiate po diet.   Georganne Siple S. Alford Highland, PharmD, [facility] Clinical Staff Pharmacist Pager [phone]  07/25/2017 7:32 AM

## 2017-07-25 NOTE — Progress Notes (Signed)
Physical Therapy Treatment Patient Details Name: Gerald Farrell MRN: [id number] DOB: 1945-01-06 Today's Date: 07/25/2017    History of Present Illness Pt is a male 72 y.o. male admitted with SBO and hematemesis.   He underwent open lysis of adhesions, Small bowel resection, completion of chelcystectomy on 7/18. He was found to have lung abcess on CT and underwent brochoscopy.    PMH includes:  ETOH abuse, basal cell carcinoma, HTN, acute cholecystitis, s/p lumbar laminectomy,  s/p TKA     PT Comments    Pt declining ambulation or OOB activity at this time. He stated that he ambulated "for 30 minutes this morning" with staff. Pt was agreeable to bed level therex (see below). PT will continue to follow acutely to progress mobility as tolerated.    Follow Up Recommendations  No PT follow up;Supervision for mobility/OOB     Equipment Recommendations  None recommended by PT    Recommendations for Other Services       Precautions / Restrictions Precautions Precautions: Fall Restrictions Weight Bearing Restrictions: No    Mobility  Bed Mobility Overal bed mobility: Needs Assistance Bed Mobility: Rolling;Sidelying to Sit;Sit to Supine Rolling: Supervision Sidelying to sit: Supervision   Sit to supine: Min guard   General bed mobility comments: encouraged pt to use log roll technique for comfort, supervision to min guard for safety  Transfers                 General transfer comment: pt declining at this time, reported that he ambulated this morning "for 30 minutes" - agreeable to therex  Ambulation/Gait                 Stairs             Wheelchair Mobility    Modified Rankin (Stroke Patients Only)       Balance                                            Cognition Arousal/Alertness: Awake/alert Behavior During Therapy: WFL for tasks assessed/performed Overall Cognitive Status: Within Functional Limits for tasks assessed                                         Exercises General Exercises - Lower Extremity Ankle Circles/Pumps: AROM;Both;10 reps;Supine Quad Sets: AROM;Strengthening;Both;10 reps;Supine Gluteal Sets: AROM;Strengthening;Both;10 reps;Supine Long Arc Quad: AROM;Strengthening;Both;10 reps;Seated Hip ABduction/ADduction: AROM;Strengthening;Both;10 reps;Supine Straight Leg Raises: AROM;Strengthening;Both;10 reps;Supine Hip Flexion/Marching: AROM;Strengthening;Both;10 reps;Seated    General Comments        Pertinent Vitals/Pain Pain Assessment: Faces Faces Pain Scale: Hurts little more Pain Location: abdomen Pain Descriptors / Indicators: Discomfort;Operative site guarding Pain Intervention(s): Monitored during session;Repositioned    Home Living                      Prior Function            PT Goals (current goals can now be found in the care plan section) Acute Rehab PT Goals PT Goal Formulation: With patient Time For Goal Achievement: 07/30/17 Potential to Achieve Goals: Fair Progress towards PT goals: Progressing toward goals    Frequency    Min 3X/week      PT Plan Current plan remains appropriate    Co-evaluation  AM-PAC PT "6 Clicks" Daily Activity  Outcome Measure  Difficulty turning over in bed (including adjusting bedclothes, sheets and blankets)?: None Difficulty moving from lying on back to sitting on the side of the bed? : None Difficulty sitting down on and standing up from a chair with arms (e.g., wheelchair, bedside commode, etc,.)?: A Little Help needed moving to and from a bed to chair (including a wheelchair)?: A Little Help needed walking in hospital room?: A Little Help needed climbing 3-5 steps with a railing? : A Little 6 Click Score: 20    End of Session   Activity Tolerance: Patient limited by pain;Patient limited by fatigue Patient left: in bed;with call bell/phone within reach Nurse  Communication: Mobility status PT Visit Diagnosis: Other abnormalities of gait and mobility (R26.89);Muscle weakness (generalized) (M62.81)     Time: 8875-7972 PT Time Calculation (min) (ACUTE ONLY): 16 min  Charges:  $Therapeutic Exercise: 8-22 mins                     [place], [place], [place] 07/25/2017, 4:13 PM

## 2017-07-25 NOTE — Progress Notes (Signed)
Patient ID: Gerald Farrell, male   DOB: 1945/06/19, 72 y.o.   MRN: [id number]    7 Days Post-Op  Subjective: Pt had a BM overnight and is passing a lot of flatus.  Still with some anxiety, but overall improving.  Objective: Vital signs in last 24 hours: Temp:  [97.8 F (36.6 C)-98.6 F (37 C)] 98.6 F (37 C) (07/25 1006) Pulse Rate:  [86-94] 91 (07/25 1006) Resp:  [12-18] 17 (07/25 1021) BP: (144-181)/(81-98) 155/86 (07/25 1006) SpO2:  [100 %] 100 % (07/25 1021) Last BM Date: 07/17/17  Intake/Output from previous day: 07/24 0701 - 07/25 0700 In: 2012.9 [I.V.:1820.3; IV Piggyback:192.6] Out: 1120 [Urine:820; Emesis/NG output:300] Intake/Output this shift: Total I/O In: -  Out: 450 [Urine:450]  PE: Heart: regular Lungs: CTAB Abd: soft, midline incision is mostly open and packed.  Some sutures in place secondary to previous bleeding, +BS, minimal NGT output.  JP with some serousang output.  Lab Results:  Recent Labs    07/24/17 0419 07/25/17 0326  WBC 7.3 9.3  HGB 7.8* 7.5*  HCT 23.7* 23.6*  PLT 289 325   BMET Recent Labs    07/24/17 0419 07/25/17 0326  NA 137 135  K 3.4* 3.9  CL 103 106  CO2 29 26  GLUCOSE 107* 109*  BUN 7* 12  CREATININE 0.39* 0.42*  CALCIUM 8.0* 7.9*   PT/INR No results for input(s): LABPROT, INR in the last 72 hours. CMP     Component Value Date/Time   NA 135 07/25/2017 0326   K 3.9 07/25/2017 0326   CL 106 07/25/2017 0326   CO2 26 07/25/2017 0326   GLUCOSE 109 (H) 07/25/2017 0326   BUN 12 07/25/2017 0326   CREATININE 0.42 (L) 07/25/2017 0326   CALCIUM 7.9 (L) 07/25/2017 0326   PROT 4.5 (L) 07/25/2017 0326   ALBUMIN 1.8 (L) 07/25/2017 0326   AST 14 (L) 07/25/2017 0326   ALT 11 07/25/2017 0326   ALKPHOS 60 07/25/2017 0326   BILITOT 0.5 07/25/2017 0326   GFRNONAA >60 07/25/2017 0326   GFRAA >60 07/25/2017 0326   Lipase     Component Value Date/Time   LIPASE 21 07/14/2017 0943       Studies/Results: No results  found.  Anti-infectives: Anti-infectives (From admission, onward)   Start     Dose/Rate Route Frequency Ordered Stop   07/14/17 0900  piperacillin-tazobactam (ZOSYN) IVPB 3.375 g     3.375 g 12.5 mL/hr over 240 Minutes Intravenous Every 8 hours 07/14/17 0818         Assessment/Plan HTN-Norvasc p.o., Catapres p.o.Hctz PO, Coreg Acute on chronic anemia -hgb/hct 7.8/23.7 from 6.4/19.9 (7/23) - s/p 1 unit PRBC 7/23 Tobacco use Chronic pain - robaxin, gabapentin RUL PNA - per CCM can transition to Augmentin to complete a 14 d total course Abx Severe malnutrition - TNA started 07/22/17, if tolerates clears today, can start to wean TNA tomorrow, 7/26  pSBO 2/2 internal hernia s/pLaparoscopic lysis of adhesions,Conversion to exploratory laparotomy with small bowel resection and primary repair of small bowel serosal tear,Completion of prior partial cholecystectomy.7/18 Dr. Redmond Pulling - POD#7 - Afebrile, VSS  - + bowel function.  DC NGT and give clear liquids - Continue wet to dry dressings to midline wound - Continue JP drain  - Mobilize/IS  FEN: clear liquids,TNA; PCA for pain; Mg/Phos/K normalized ID: Zosyn 7/14 >> (#11).  Not sure he needs further abx therapy, will D/W MD VTE: SCD's, Lovenox Foley:none Follow up: Dr. Redmond Pulling  LOS: 11 days    Henreitta Cea , [facility] 07/25/2017, 11:04 AM Pager: [phone]

## 2017-07-25 NOTE — Progress Notes (Signed)
Gerald Farrell witnessed Kimanh Templeman wast 3 ml of dilaudid.

## 2017-07-25 NOTE — Progress Notes (Signed)
Nutrition Follow-up  DOCUMENTATION CODES:   Severe malnutrition in context of acute illness/injury  INTERVENTION:   -TPN management per pharmacy -RD will follow for diet advancement and supplement as appropriate  NUTRITION DIAGNOSIS:   Moderate Malnutrition related to acute illness(gangrenous cholecystits) as evidenced by energy intake < or equal to 50% for > or equal to 5 days, mild fat depletion, moderate fat depletion, mild muscle depletion, moderate muscle depletion.  Ongoing  GOAL:   Patient will meet greater than or equal to 90% of their needs  Met with TPN  MONITOR:   Diet advancement, Labs, Weight trends, Skin, I & O's  REASON FOR ASSESSMENT:   NPO/Clear Liquid Diet, Consult New TPN/TNA  ASSESSMENT:   [name] with history of hypertension, tobacco use and recent melanoma removal status post laparoscopic partial cholecystectomy April 29, 2017 for gangrenous cholecystitis was transferred from [facility] for abdominal pain, small bowel obstruction and hematemesis.  7/14- NGT placed 7/18- s/p laparoscopic lysis of adhesions, ex lap with SBR and primary repair to small bowel serosal tear, and completion of prior partial cholecystectomy 7/22- PICC placed, TPN initiated  Reviewed I/O's: 300 ml NGT output x 24 hours. +893 ml x 24 hours and +1.6 L since admission  Reviewed general surgery notes; plan for potential clamping trials within the next 24-48 hours.   TPN advanced to goal rate of 75 ml/hr on 07/24/17, which is providing 1875 kcals and 101 grams protein, which meets 99% of estimated kcal needs and 100% of estimated protein needs.   Labs reviewed: K, Mg, and Phos WDl. CBGS: 104-117 (inpatient orders for glycemic control are 0-9 units insulin aspart every 6 hours).  Diet Order:   Diet Order           Diet NPO time specified Except for: Ice Chips, Other (See Comments)  Diet effective midnight          EDUCATION NEEDS:    Education needs have been addressed  Skin:  Skin Assessment: Skin Integrity Issues: Skin Integrity Issues:: Incisions Incisions: closed abdomen  Last BM:  07/17/17  Height:   Ht Readings from Last 1 Encounters:  07/14/17 5' 9" (1.753 m)    Weight:   Wt Readings from Last 1 Encounters:  07/14/17 138 lb (62.6 kg)    Ideal Body Weight:  72.7 kg  BMI:  Body mass index is 20.38 kg/m.  Estimated Nutritional Needs:   Kcal:  1900-2100  Protein:  90-105 grams  Fluid:  >1.9 L    Gerald Farrell A. Jimmye Norman, RD, LDN, CDE Pager: [phone] After hours Pager: [phone]

## 2017-07-26 ENCOUNTER — Inpatient Hospital Stay (HOSPITAL_COMMUNITY): Payer: Medicare Other

## 2017-07-26 LAB — GLUCOSE, CAPILLARY
GLUCOSE-CAPILLARY: 107 mg/dL — AB (ref 70–99)
GLUCOSE-CAPILLARY: 111 mg/dL — AB (ref 70–99)
GLUCOSE-CAPILLARY: 103 mg/dL — AB (ref 70–99)

## 2017-07-26 MED ORDER — TRACE MINERALS CR-CU-MN-SE-ZN 10-1000-500-60 MCG/ML IV SOLN
INTRAVENOUS | Status: AC
  Administered 2017-07-26: 18:00:00 via INTRAVENOUS
  Filled 2017-07-26: qty 672

## 2017-07-26 MED ORDER — BOOST / RESOURCE BREEZE PO LIQD CUSTOM
1.0000 | Freq: Three times a day (TID) | ORAL | Status: DC
  Administered 2017-07-26 – 2017-07-30 (×9): 1 via ORAL

## 2017-07-26 NOTE — Plan of Care (Signed)
  Problem: Nutrition: Goal: Adequate nutrition will be maintained Outcome: Progressing   Problem: Pain Managment: Goal: General experience of comfort will improve Outcome: Progressing   Problem: Skin Integrity: Goal: Risk for impaired skin integrity will decrease Outcome: Progressing   Problem: Clinical Measurements: Goal: Postoperative complications will be avoided or minimized Outcome: Progressing

## 2017-07-26 NOTE — Progress Notes (Addendum)
Nutrition Follow-up  DOCUMENTATION CODES:   Severe malnutrition in context of acute illness/injury  INTERVENTION:   -TPN management per pharmacy -Boost Breeze po TID, each supplement provides 250 kcal and 9 grams of protein -RD will follow for diet advancement and supplement regimen as appropriate  NUTRITION DIAGNOSIS:   Moderate Malnutrition related to acute illness(gangrenous cholecystits) as evidenced by energy intake < or equal to 50% for > or equal to 5 days, mild fat depletion, moderate fat depletion, mild muscle depletion, moderate muscle depletion.  Ongoing  GOAL:   Patient will meet greater than or equal to 90% of their needs  Met with TPN  MONITOR:   Diet advancement, Labs, Weight trends, Skin, I & O's  REASON FOR ASSESSMENT:   NPO/Clear Liquid Diet, Consult New TPN/TNA  ASSESSMENT:   [name] with history of hypertension, tobacco use and recent melanoma removal status post laparoscopic partial cholecystectomy April 29, 2017 for gangrenous cholecystitis was transferred from [facility] for abdominal pain, small bowel obstruction and hematemesis.  7/14- NGT placed 7/18- s/p laparoscopic lysis of adhesions, ex lap with SBR and primary repair to small bowel serosal tear, and completion of prior partial cholecystectomy 7/22- PICC placed, TPN initiated 7/25- NGT removed  Reviewed I/O's: +2.2 L x 24 hours and +3.8 L since admission  Case discussed with RN prior to visit, who reports pt is tolerating liquids well, without complaints.   Spoke with pt at bedside, who is in good spirits today and very motivated due to his recent progress. He reports he feels a lot better since getting NGT removed. Pt shares that he has been taking small bites and sips of liquids without difficulty. Noted pt had a few bites of jello prior to visit.   Pt reports he feels full, likely due to TPN. Educated pt that TPN will likely due slowly reduced as  diet is advanced and intake is improved (per MD notes, may start weaning TPN as early as today).   Pt remains on TPN @ 75 ml/hr, which provides 1875 kcals and 101 grams protein, meeting 99% of estimated kcal needs and 100% of estimated protein needs. Per pharmacy notes, at 1800, TPN will be decreased to 50 mL/hr, which provides 1250 kcals and 67 grams protein, meeting 65% of estimated kcal needs and 75% of estimated protein needs.   Labs reviewed: CBGS: 100-107.   Diet Order:   Diet Order           Diet clear liquid Room service appropriate? Yes; Fluid consistency: Thin  Diet effective now          EDUCATION NEEDS:   Education needs have been addressed  Skin:  Skin Assessment: Skin Integrity Issues: Skin Integrity Issues:: Incisions Incisions: closed abdomen  Last BM:  07/24/17  Height:   Ht Readings from Last 1 Encounters:  07/14/17 '5\' 9"'$  (1.753 m)    Weight:   Wt Readings from Last 1 Encounters:  07/14/17 138 lb (62.6 kg)    Ideal Body Weight:  72.7 kg  BMI:  Body mass index is 20.38 kg/m.  Estimated Nutritional Needs:   Kcal:  1900-2100  Protein:  90-105 grams  Fluid:  >1.9 L   Katherine Syme A. Jimmye Norman, RD, LDN, CDE Pager: [phone] After hours Pager: [phone]

## 2017-07-26 NOTE — Progress Notes (Signed)
8 Days Post-Op   Subjective/Chief Complaint: Pt tol CLD Min BM   Objective: Vital signs in last 24 hours: Temp:  [98 F (36.7 C)-98.6 F (37 C)] 98.6 F (37 C) (07/26 0319) Pulse Rate:  [91-102] 93 (07/26 0319) Resp:  [13-20] 13 (07/26 0802) BP: (149-159)/(79-87) 149/79 (07/26 0319) SpO2:  [98 %-100 %] 100 % (07/26 0802) Last BM Date: 07/24/17  Intake/Output from previous day: 07/25 0701 - 07/26 0700 In: 3665.3 [P.O.:655; I.V.:2946.2; IV Piggyback:64.1] Out: 1462 [Urine:1425; Drains:37] Intake/Output this shift: Total I/O In: -  Out: 300 [Urine:300]  General appearance: alert and cooperative GI: soft inc c/d/i-packed, JP SS  Lab Results:  Recent Labs    07/24/17 0419 07/25/17 0326  WBC 7.3 9.3  HGB 7.8* 7.5*  HCT 23.7* 23.6*  PLT 289 325   BMET Recent Labs    07/24/17 0419 07/25/17 0326  NA 137 135  K 3.4* 3.9  CL 103 106  CO2 29 26  GLUCOSE 107* 109*  BUN 7* 12  CREATININE 0.39* 0.42*  CALCIUM 8.0* 7.9*   PT/INR No results for input(s): LABPROT, INR in the last 72 hours. ABG No results for input(s): PHART, HCO3 in the last 72 hours.  Invalid input(s): PCO2, PO2  Studies/Results: No results found.  Anti-infectives: Anti-infectives (From admission, onward)   Start     Dose/Rate Route Frequency Ordered Stop   07/14/17 0900  piperacillin-tazobactam (ZOSYN) IVPB 3.375 g  Status:  Discontinued     3.375 g 12.5 mL/hr over 240 Minutes Intravenous Every 8 hours 07/14/17 0818 07/25/17 1438      Assessment/Plan:  HTN-Norvasc p.o., Catapres p.o.Hctz PO, Coreg Acute on chronicanemia-hgb/hct 7.8/23.7 from6.4/19.9 (7/23) -s/p1 unit PRBC7/23 Tobacco use Chronic pain - robaxin, gabapentin RUL PNA - per CCM can transition to Augmentin to complete a 14 d total course Abx Severe malnutrition - TNA started 07/22/17, if tolerates clears today, can start to wean TNA tomorrow, 7/26  pSBO 2/2 internal hernia s/pLaparoscopic lysis of  adhesions,Conversion to exploratory laparotomy with small bowel resection and primary repair of small bowel serosal tear,Completion of prior partial cholecystectomy.7/18 Dr. Redmond Pulling - POD#8 - Afebrile, VSS  - + bowel function. tol clear liquids - Continue wet to dry dressings to midline wound - Continue JP drain  - Mobilize/IS  FEN: clear liquids-he wants clears for now, but probably ready to adv in next 1-2 and start [place] TNA; PCA for pain;  ID: Zosyn 7/14 >> (#11).  Not sure he needs further abx therapy, will D/W MD VTE: SCD's, Lovenox Foley:none Follow up: Dr. Redmond Pulling    LOS: 12 days    Rosario Jacks., [facility] 07/26/2017

## 2017-07-26 NOTE — Progress Notes (Addendum)
[place] CONSULT NOTE   Pharmacy Consult for TPN Indication: Prolonged Ileus  Patient Measurements: Height: _0  (175.3 cm) Weight: 138 lb (62.6 kg) IBW/kg (Calculated) : 70.7 TPN AdjBW (KG): 62.6 Body mass index is 20.38 kg/m.  Assessment: 72 year old male who underwent partial cholecystectomy in late April for gangrenous cholecystitis in which the neck of the gallbladder was left due to severe inflammation and surgical drain was placed. He developed a postoperative bile leak and underwent ERCP, sphincterotomy and biliary stent placement that admission. The stent was removed 06/26/17 and since that time has had intermittent abdominal pain which became severe on 07/13/17 with hematemesis bringing him to the ER. CT scan demonstrated an SBO 2/2 to internal hernia. On 07/18/17, he underwent exploratory laparotomy with small bowel resection and repair of small bowel serosal tear as well as completion of partial cholecystectomy. Patient has been essentially NPO for 8 days.  GI: Prealbumin 12.1 (7/23). + LBM 7/24. + flatus. NGT out, drain output 0. Famotidine in TPN. Awaiting return of bowel function. 663m po intake noted on clear liquids  Endo: BG well controlled on Q6h sensitive SSI  Insulin requirements in the past 24 hours: 0 units  Lytes: K 3.9 replaced, Mg 2.2, Phos 2.7. CoCa 9.6- no new labs 7/26. IVF- D5-NS + 20K at 75 mL/hr.   Renal: SCr 0.42- stable. flomax  Pulm: h/o tobacco on nicotine patch. 2LNC. Robitussin  Heme/Onc: low Hg still low 7.5 - s/p transfusion 7/23  Cards: HTN - Clonidine patch, IV metoprolol (norvasc, coreg, hctz-hold)  Hepatobil: LFTs / tbili / TG wnl 67  Neuro: Chronic pain, Dilaudid PCA, Nicotine patch (Gabapentin off). Anxiety  ID: s/p Zosyn for PNA - afebrile. WBC WNL. BAL negative  TPN Access: PICC 7/22 TPN start date: 7/22 Nutritional Goals (per RD recommendation on pending): KCal: 1900-2100 Protein: 90-105  g Fluid: >1.9L  Goal TPN rate is 75 ml/hr (provides 100%)  Current Nutrition:  NPO  Plan:  Decrease/weanTPN to 50 mL/hr  - This TPN provides 67.2 g of protein, 204 g of dextrose, and 28.8 g of lipids which provides 1250 kCals per day, meeting 75% of protein and 65% of kCal needs. Electrolytes in TPN: Maintain current regimen Add MVI, trace elements to TPN Add Pepcid to TPN, change to po tomorrow Continue Sensitive Q6h SSI and adjust as needed IVMF (D5-NS+20K) at 75 ml/hr  Monitor TPN labs Mon/Thurs and prn Clear liquid diet  Nastacia Raybuck S. RAlford Highland PharmD, BVenice Regional Medical CenterClinical Staff Pharmacist Pager [phone] 07/26/2017 9:00 AM

## 2017-07-27 LAB — BPAM RBC
BLOOD PRODUCT EXPIRATION DATE: 201908152359
Blood Product Expiration Date: 201907302359
ISSUE DATE / TIME: 201907231014
ISSUE DATE / TIME: 201907231502
UNIT TYPE AND RH: 5100
Unit Type and Rh: 9500

## 2017-07-27 LAB — GLUCOSE, CAPILLARY
GLUCOSE-CAPILLARY: 100 mg/dL — AB (ref 70–99)
GLUCOSE-CAPILLARY: 107 mg/dL — AB (ref 70–99)
GLUCOSE-CAPILLARY: 95 mg/dL (ref 70–99)
Glucose-Capillary: 114 mg/dL — ABNORMAL HIGH (ref 70–99)

## 2017-07-27 LAB — TYPE AND SCREEN
ABO/RH(D): O POS
Antibody Screen: NEGATIVE
UNIT DIVISION: 0
Unit division: 0

## 2017-07-27 LAB — BASIC METABOLIC PANEL
ANION GAP: 5 (ref 5–15)
BUN: 10 mg/dL (ref 8–23)
CALCIUM: 7.9 mg/dL — AB (ref 8.9–10.3)
CHLORIDE: 102 mmol/L (ref 98–111)
CO2: 24 mmol/L (ref 22–32)
Creatinine, Ser: 0.38 mg/dL — ABNORMAL LOW (ref 0.61–1.24)
GFR calc non Af Amer: >60 mL/min (ref >60)
GLUCOSE: 105 mg/dL — AB (ref 70–99)
Potassium: 4.1 mmol/L (ref 3.5–5.1)
Sodium: 131 mmol/L — ABNORMAL LOW (ref 135–145)

## 2017-07-27 MED ORDER — FAMOTIDINE 20 MG PO TABS
20.0000 mg | ORAL_TABLET | Freq: Two times a day (BID) | ORAL | Status: DC
  Administered 2017-07-27 – 2017-08-02 (×12): 20 mg via ORAL
  Filled 2017-07-27 (×12): qty 1

## 2017-07-27 MED ORDER — AMLODIPINE BESYLATE 5 MG PO TABS
5.0000 mg | ORAL_TABLET | Freq: Every day | ORAL | Status: DC
  Administered 2017-07-27 – 2017-08-02 (×7): 5 mg via ORAL
  Filled 2017-07-27 (×7): qty 1

## 2017-07-27 MED ORDER — TRAVASOL 10 % IV SOLN
INTRAVENOUS | Status: AC
  Administered 2017-07-27: 18:00:00 via INTRAVENOUS
  Filled 2017-07-27: qty 537.6

## 2017-07-27 MED ORDER — GABAPENTIN 400 MG PO CAPS
800.0000 mg | ORAL_CAPSULE | Freq: Three times a day (TID) | ORAL | Status: DC
  Administered 2017-07-27 – 2017-08-01 (×16): 800 mg via ORAL
  Filled 2017-07-27 (×16): qty 2

## 2017-07-27 NOTE — Progress Notes (Signed)
[place] CONSULT NOTE   Pharmacy Consult for TPN Indication: Prolonged Ileus  Patient Measurements: Height: _0  (175.3 cm) Weight: 138 lb (62.6 kg) IBW/kg (Calculated) : 70.7 TPN AdjBW (KG): 62.6 Body mass index is 20.38 kg/m.  Assessment: 72 year old male who underwent partial cholecystectomy in late April for gangrenous cholecystitis in which the neck of the gallbladder was left due to severe inflammation and surgical drain was placed. He developed a postoperative bile leak and underwent ERCP, sphincterotomy and biliary stent placement that admission. The stent was removed 06/26/17 and since that time has had intermittent abdominal pain which became severe on 07/13/17 with hematemesis bringing him to the ER. CT scan demonstrated an SBO 2/2 to internal hernia. On 07/18/17, he underwent exploratory laparotomy with small bowel resection and repair of small bowel serosal tear as well as completion of partial cholecystectomy. Patient has been essentially NPO for 8 days.  GI: Prealbumin 12.1 (7/23). + LBM 7/24. + flatus. NGT out, drain output 40. Famotidine in TPN. Awaiting return of bowel function. 633m0 po intake noted on clear liquids. Pt c/o feeling full.  Endo: BG well controlled on Q6h sensitive SSI  Insulin requirements in the past 24 hours: 0 units  Lytes: K 4.1 replaced, Mg 2.2, Phos 2.7. CoCa 9.6 IVF- D5-NS + 20K at 75 mL/hr.   Renal: SCr 0.38- stable.   Pulm: h/o tobacco on nicotine patch. 2LNC. Robitussin  Heme/Onc: low Hg still low 7.5 - s/p transfusion 7/23  Cards: HTN - Clonidine patch, IV metoprolol (norvasc, coreg, hctz-hold)  Hepatobil: LFTs / tbili / TG wnl 67  Neuro: Chronic pain, Dilaudid PCA, Nicotine patch (Gabapentin off). Anxiety  ID: s/p Zosyn for PNA - afebrile. WBC WNL. BAL negative  TPN Access: PICC 7/22 TPN start date: 7/22 Nutritional Goals (per RD recommendation on pending): KCal: 1900-2100 Protein: 90-105  g Fluid: >1.9L  Goal TPN rate is 75 ml/hr (provides 100%)  Current Nutrition:  NPO  Plan:  Decrease/weanTPN to 40 mL/hr for appetite - This TPN provides 53.7 g of protein, 163 g of dextrose, and 23 g of lipids which provides 1000 kCals per day. Electrolytes in TPN: Maintain current regimen Add MVI, trace elements to TPN Continue Sensitive Q6h SSI and adjust as needed IVMF (D5-NS+20K) at 75 ml/hr  Monitor TPN labs Mon/Thurs and prn Advance to full liquids and con't TPN Remove Pepcid from TPN and change to PO  Meshell Abdulaziz S. RAlford Highland PharmD, BOur Lady Of Fatima HospitalClinical Staff Pharmacist Pager [phone] 07/27/2017 9:25 AM

## 2017-07-27 NOTE — Progress Notes (Signed)
Patient ID: Gerald Farrell, male   DOB: 17-Mar-1945, 72 y.o.   MRN: [id number] [facility] Progress Note:   9 Days Post-Op  Subjective: Mental status is alert .  No complaints Objective: Vital signs in last 24 hours: Temp:  [97.5 F (36.4 C)-98.7 F (37.1 C)] 97.8 F (36.6 C) (07/27 0537) Pulse Rate:  [86-96] 88 (07/27 0537) Resp:  [13-20] 16 (07/27 0537) BP: (129-161)/(73-101) 144/79 (07/27 0557) SpO2:  [99 %-100 %] 100 % (07/27 0537)  Intake/Output from previous day: 07/26 0701 - 07/27 0700 In: 1597.8 [I.V.:1597.8] Out: 1490 [Urine:1450; Drains:40] Intake/Output this shift: No intake/output data recorded.  Physical Exam: Work of breathing is OK.  TNA goiing;  JP dark bile/blood  Lab Results:  Results for orders placed or performed during the hospital encounter of 07/14/17 (from the past 48 hour(s))  Glucose, capillary     Status: Abnormal   Collection Time: 07/25/17 11:48 AM  Result Value Ref Range   Glucose-Capillary 101 (H) 70 - 99 mg/dL  Glucose, capillary     Status: Abnormal   Collection Time: 07/25/17  5:59 PM  Result Value Ref Range   Glucose-Capillary 100 (H) 70 - 99 mg/dL  Glucose, capillary     Status: Abnormal   Collection Time: 07/25/17 11:36 PM  Result Value Ref Range   Glucose-Capillary 101 (H) 70 - 99 mg/dL  Glucose, capillary     Status: Abnormal   Collection Time: 07/26/17  5:31 AM  Result Value Ref Range   Glucose-Capillary 107 (H) 70 - 99 mg/dL  Glucose, capillary     Status: Abnormal   Collection Time: 07/26/17 11:59 AM  Result Value Ref Range   Glucose-Capillary 111 (H) 70 - 99 mg/dL  Glucose, capillary     Status: Abnormal   Collection Time: 07/26/17  6:23 PM  Result Value Ref Range   Glucose-Capillary 103 (H) 70 - 99 mg/dL  Glucose, capillary     Status: Abnormal   Collection Time: 07/26/17 11:58 PM  Result Value Ref Range   Glucose-Capillary 100 (H) 70 - 99 mg/dL  Basic metabolic panel     Status: Abnormal   Collection Time:  07/27/17  4:24 AM  Result Value Ref Range   Sodium 131 (L) 135 - 145 mmol/L   Potassium 4.1 3.5 - 5.1 mmol/L   Chloride 102 98 - 111 mmol/L   CO2 24 22 - 32 mmol/L   Glucose, Bld 105 (H) 70 - 99 mg/dL   BUN 10 8 - 23 mg/dL   Creatinine, Ser 0.38 (L) 0.61 - 1.24 mg/dL   Calcium 7.9 (L) 8.9 - 10.3 mg/dL   GFR calc non Af Amer >60 >60 mL/min   GFR calc Af Amer >60 >60 mL/min    Comment: (NOTE) The eGFR has been calculated using the CKD EPI equation. This calculation has not been validated in all clinical situations. eGFR's persistently <60 mL/min signify possible Chronic Kidney Disease.    Anion gap 5 5 - 15    Comment: Performed at [facility] [address] [place], [place] [address]    Radiology/Results: Dg Chest Port 1 View  Result Date: 07/26/2017 CLINICAL DATA:  Right chest pain EXAM: PORTABLE CHEST 1 VIEW COMPARISON:  07/16/2017 FINDINGS: Right upper lobe perihilar infiltrate has improved. No right effusion. Small left effusion. Negative for heart failure. Right arm PICC tip at the cavoatrial junction. IMPRESSION: Interval improvement in right upper lobe infiltrate. Small left pleural effusion unchanged. Right arm PICC  tip at the cavoatrial junction. Electronically Signed   By: Franchot Gallo M.D.   On: 07/26/2017 13:21    Anti-infectives: Anti-infectives (From admission, onward)   Start     Dose/Rate Route Frequency Ordered Stop   07/14/17 0900  piperacillin-tazobactam (ZOSYN) IVPB 3.375 g  Status:  Discontinued     3.375 g 12.5 mL/hr over 240 Minutes Intravenous Every 8 hours 07/14/17 0818 07/25/17 1438      Assessment/Plan: Problem List: Patient Active Problem List   Diagnosis Date Noted  . Protein-calorie malnutrition, severe 07/23/2017  . Status post surgery 07/18/2017  . Cough   . Abnormal CT of the chest   . Small bowel obstruction ([place]) 07/14/2017  . Abnormal cholangiogram   . Bile duct leak     Advance to full liquids; continue TNA for now.   9  Days Post-Op    LOS: 13 days   Matt B. Hassell Done, MD, [facility] [phone] beeper [phone]  07/27/2017 7:50 AM

## 2017-07-28 LAB — GLUCOSE, CAPILLARY
GLUCOSE-CAPILLARY: 101 mg/dL — AB (ref 70–99)
GLUCOSE-CAPILLARY: 113 mg/dL — AB (ref 70–99)
Glucose-Capillary: 106 mg/dL — ABNORMAL HIGH (ref 70–99)

## 2017-07-28 MED ORDER — HYDROCORTISONE 1 % EX CREA
TOPICAL_CREAM | Freq: Three times a day (TID) | CUTANEOUS | Status: DC | PRN
  Administered 2017-07-28: 22:00:00 via TOPICAL
  Filled 2017-07-28 (×2): qty 28

## 2017-07-28 NOTE — Progress Notes (Signed)
Patient ID: Gerald Farrell, male   DOB: [birth year], 72 y.o.   MRN: [id number] [facility] Progress Note:   10 Days Post-Op  Subjective: Mental status is clear;  Had cramps yesterday followed by a large BM>> felt better Objective: Vital signs in last 24 hours: Temp:  [97.7 F (36.5 C)-100.5 F (38.1 C)] 100.3 F (37.9 C) (07/28 0429) Pulse Rate:  [97-118] 97 (07/28 0429) Resp:  [15-20] 20 (07/28 0429) BP: (128-157)/(72-81) 128/72 (07/28 0429) SpO2:  [93 %-100 %] 100 % (07/28 0429)  Intake/Output from previous day: 07/27 0701 - 07/28 0700 In: 3462.3 [P.O.:900; I.V.:2299.5; IV Piggyback:262.8] Out: 1720 [Urine:1700; Drains:20] Intake/Output this shift: No intake/output data recorded.  Physical Exam: Work of breathing is normal.  JP port wine color  Lab Results:  Results for orders placed or performed during the hospital encounter of 07/14/17 (from the past 48 hour(s))  Glucose, capillary     Status: Abnormal   Collection Time: 07/26/17 11:59 AM  Result Value Ref Range   Glucose-Capillary 111 (H) 70 - 99 mg/dL  Glucose, capillary     Status: Abnormal   Collection Time: 07/26/17  6:23 PM  Result Value Ref Range   Glucose-Capillary 103 (H) 70 - 99 mg/dL  Glucose, capillary     Status: Abnormal   Collection Time: 07/26/17 11:58 PM  Result Value Ref Range   Glucose-Capillary 100 (H) 70 - 99 mg/dL  Basic metabolic panel     Status: Abnormal   Collection Time: 07/27/17  4:24 AM  Result Value Ref Range   Sodium 131 (L) 135 - 145 mmol/L   Potassium 4.1 3.5 - 5.1 mmol/L   Chloride 102 98 - 111 mmol/L   CO2 24 22 - 32 mmol/L   Glucose, Bld 105 (H) 70 - 99 mg/dL   BUN 10 8 - 23 mg/dL   Creatinine, Ser 0.38 (L) 0.61 - 1.24 mg/dL   Calcium 7.9 (L) 8.9 - 10.3 mg/dL   GFR calc non Af Amer >60 >60 mL/min   GFR calc Af Amer >60 >60 mL/min    Comment: (NOTE) The eGFR has been calculated using the CKD EPI equation. This calculation has not been validated in all clinical  situations. eGFR's persistently <60 mL/min signify possible Chronic Kidney Disease.    Anion gap 5 5 - 15    Comment: Performed at [facility] [address] [place], [place] [address]  Glucose, capillary     Status: Abnormal   Collection Time: 07/27/17 11:47 AM  Result Value Ref Range   Glucose-Capillary 114 (H) 70 - 99 mg/dL   Comment 1 Notify RN   Glucose, capillary     Status: Abnormal   Collection Time: 07/27/17  6:38 PM  Result Value Ref Range   Glucose-Capillary 107 (H) 70 - 99 mg/dL   Comment 1 Notify RN   Glucose, capillary     Status: None   Collection Time: 07/27/17 11:56 PM  Result Value Ref Range   Glucose-Capillary 95 70 - 99 mg/dL  Glucose, capillary     Status: Abnormal   Collection Time: 07/28/17  4:26 AM  Result Value Ref Range   Glucose-Capillary 106 (H) 70 - 99 mg/dL    Radiology/Results: Dg Chest Port 1 View  Result Date: 07/26/2017 CLINICAL DATA:  Right chest pain EXAM: PORTABLE CHEST 1 VIEW COMPARISON:  07/16/2017 FINDINGS: Right upper lobe perihilar infiltrate has improved. No right effusion. Small left effusion. Negative for heart failure. Right arm PICC  tip at the cavoatrial junction. IMPRESSION: Interval improvement in right upper lobe infiltrate. Small left pleural effusion unchanged. Right arm PICC tip at the cavoatrial junction. Electronically Signed   By: Franchot Gallo M.D.   On: 07/26/2017 13:21    Anti-infectives: Anti-infectives (From admission, onward)   Start     Dose/Rate Route Frequency Ordered Stop   07/14/17 0900  piperacillin-tazobactam (ZOSYN) IVPB 3.375 g  Status:  Discontinued     3.375 g 12.5 mL/hr over 240 Minutes Intravenous Every 8 hours 07/14/17 0818 07/25/17 1438      Assessment/Plan: Problem List: Patient Active Problem List   Diagnosis Date Noted  . Protein-calorie malnutrition, severe 07/23/2017  . Status post surgery 07/18/2017  . Cough   . Abnormal CT of the chest   . Small bowel obstruction ([place])  07/14/2017  . Abnormal cholangiogram   . Bile duct leak     Check lab in am.  Improving slowly  10 Days Post-Op    LOS: 14 days   Matt B. Hassell Done, MD, [facility] [phone] beeper [phone]  07/28/2017 8:16 AM

## 2017-07-29 ENCOUNTER — Inpatient Hospital Stay (HOSPITAL_COMMUNITY): Payer: Medicare Other

## 2017-07-29 LAB — BASIC METABOLIC PANEL
Anion gap: 4 — ABNORMAL LOW (ref 5–15)
BUN: 9 mg/dL (ref 8–23)
CO2: 24 mmol/L (ref 22–32)
Calcium: 7.7 mg/dL — ABNORMAL LOW (ref 8.9–10.3)
Chloride: 104 mmol/L (ref 98–111)
Creatinine, Ser: 0.41 mg/dL — ABNORMAL LOW (ref 0.61–1.24)
GFR calc non Af Amer: >60 mL/min (ref >60)
Glucose, Bld: 95 mg/dL (ref 70–99)
Potassium: 4 mmol/L (ref 3.5–5.1)
Sodium: 132 mmol/L — ABNORMAL LOW (ref 135–145)

## 2017-07-29 LAB — CBC WITH DIFFERENTIAL/PLATELET
Abs Immature Granulocytes: 0.1 10*3/uL (ref 0.0–0.1)
BASOS ABS: 0 10*3/uL (ref 0.0–0.1)
BASOS PCT: 0 %
EOS PCT: 0 %
Eosinophils Absolute: 0.1 10*3/uL (ref 0.0–0.7)
HEMATOCRIT: 24 % — AB (ref 39.0–52.0)
Hemoglobin: 7.5 g/dL — ABNORMAL LOW (ref 13.0–17.0)
Immature Granulocytes: 1 %
LYMPHS ABS: 0.7 10*3/uL (ref 0.7–4.0)
Lymphocytes Relative: 5 %
MCH: 29.1 pg (ref 26.0–34.0)
MCHC: 31.3 g/dL (ref 30.0–36.0)
MCV: 93 fL (ref 78.0–100.0)
Monocytes Absolute: 1.3 10*3/uL — ABNORMAL HIGH (ref 0.1–1.0)
Monocytes Relative: 10 %
Neutro Abs: 10.5 10*3/uL — ABNORMAL HIGH (ref 1.7–7.7)
Neutrophils Relative %: 84 %
Platelets: 282 10*3/uL (ref 150–400)
RBC: 2.58 MIL/uL — AB (ref 4.22–5.81)
RDW: 15.6 % — ABNORMAL HIGH (ref 11.5–15.5)
WBC: 12.6 10*3/uL — AB (ref 4.0–10.5)

## 2017-07-29 LAB — PREPARE RBC (CROSSMATCH)

## 2017-07-29 MED ORDER — SODIUM CHLORIDE 0.9% IV SOLUTION: Freq: Once | INTRAVENOUS | Status: DC

## 2017-07-29 MED ORDER — POLYETHYLENE GLYCOL 3350 17 G PO PACK: 17.0000 g | PACK | Freq: Every day | ORAL | Status: DC | PRN

## 2017-07-29 MED ORDER — AMOXICILLIN-POT CLAVULANATE 875-125 MG PO TABS
1.0000 | ORAL_TABLET | Freq: Two times a day (BID) | ORAL | Status: DC
  Administered 2017-07-29 – 2017-08-02 (×8): 1 via ORAL
  Filled 2017-07-29 (×9): qty 1

## 2017-07-29 MED ORDER — OXYCODONE HCL 5 MG PO TABS
5.0000 mg | ORAL_TABLET | ORAL | Status: DC | PRN
  Administered 2017-07-29 – 2017-07-31 (×10): 10 mg via ORAL
  Administered 2017-07-31: 5 mg via ORAL
  Administered 2017-08-01 (×2): 10 mg via ORAL
  Filled 2017-07-29 (×7): qty 2
  Filled 2017-07-29: qty 1
  Filled 2017-07-29 (×5): qty 2

## 2017-07-29 MED ORDER — DOCUSATE SODIUM 100 MG PO CAPS
100.0000 mg | ORAL_CAPSULE | Freq: Two times a day (BID) | ORAL | Status: DC
  Administered 2017-07-29 – 2017-08-02 (×8): 100 mg via ORAL
  Filled 2017-07-29 (×8): qty 1

## 2017-07-29 MED ORDER — METHOCARBAMOL 500 MG PO TABS
1000.0000 mg | ORAL_TABLET | Freq: Three times a day (TID) | ORAL | Status: DC
Start: 2017-07-29 — End: 2017-08-01
  Administered 2017-07-29 – 2017-08-01 (×9): 1000 mg via ORAL
  Filled 2017-07-29 (×9): qty 2

## 2017-07-29 MED ORDER — TAB-A-VITE/IRON PO TABS
1.0000 | ORAL_TABLET | Freq: Every day | ORAL | Status: DC
  Administered 2017-07-29 – 2017-08-02 (×5): 1 via ORAL
  Filled 2017-07-29 (×5): qty 1

## 2017-07-29 MED ORDER — ACETAMINOPHEN 500 MG PO TABS
1000.0000 mg | ORAL_TABLET | Freq: Three times a day (TID) | ORAL | Status: DC
  Administered 2017-07-29 – 2017-08-02 (×12): 1000 mg via ORAL
  Filled 2017-07-29 (×12): qty 2

## 2017-07-29 MED ORDER — TRAMADOL HCL 50 MG PO TABS
50.0000 mg | ORAL_TABLET | Freq: Four times a day (QID) | ORAL | Status: DC
  Administered 2017-07-29 – 2017-07-30 (×3): 50 mg via ORAL
  Filled 2017-07-29 (×3): qty 1

## 2017-07-29 MED ORDER — HYDROMORPHONE HCL 1 MG/ML IJ SOLN
1.0000 mg | INTRAMUSCULAR | Status: DC | PRN
Start: 2017-07-29 — End: 2017-08-01
  Administered 2017-07-29 – 2017-08-01 (×9): 1 mg via INTRAVENOUS
  Filled 2017-07-29 (×9): qty 1

## 2017-07-29 NOTE — Progress Notes (Signed)
Wasted 63mL of Dilaudid PCA with Drue Dun, Therapist, sports.

## 2017-07-29 NOTE — Progress Notes (Signed)
[place] [facility] Progress Note  11 Days Post-Op  Subjective: CC: incisional pain Incisional pain with coughing and lots of movement. Tolerating FLD, passing flatus and had a BM yesterday. Concerned about getting rid of PCA because patient takes oxycodone at home for back pain. Concerned about hgb being 7.5, states he gets very fatigued with activity.   Objective: Vital signs in last 24 hours: Temp:  [97.5 F (36.4 C)-99.8 F (37.7 C)] 98.7 F (37.1 C) (07/29 1311) Pulse Rate:  [92-111] 103 (07/29 1311) Resp:  [12-18] 15 (07/29 1127) BP: (128-146)/(69-85) 128/76 (07/29 1311) SpO2:  [98 %-100 %] 100 % (07/29 1311) Last BM Date: 07/28/17  Intake/Output from previous day: 07/28 0701 - 07/29 0700 In: 3176.9 [P.O.:1110; I.V.:2016.9; IV Piggyback:50] Out: 1267 [Urine:1225; Drains:42] Intake/Output this shift: Total I/O In: -  Out: 300 [Urine:300]  PE: Gen:  Alert, NAD, pleasant Card:  Regular rate and rhythm, pedal pulses 2+ BL Pulm:  Normal effort, clear to auscultation bilaterally Abd: Soft, appropriately tender, non-distended, bowel sounds present, no HSM, incision C/D/I with sutures present in inferior aspect of wound, JP with dark clear red-brown fluid Skin: warm and dry, no rashes  Psych: A&Ox3   Lab Results:  Recent Labs    07/29/17 0305  WBC 12.6*  HGB 7.5*  HCT 24.0*  PLT 282   BMET Recent Labs    07/27/17 0424 07/29/17 0305  NA 131* 132*  K 4.1 4.0  CL 102 104  CO2 24 24  GLUCOSE 105* 95  BUN 10 9  CREATININE 0.38* 0.41*  CALCIUM 7.9* 7.7*   PT/INR No results for input(s): LABPROT, INR in the last 72 hours. CMP     Component Value Date/Time   NA 132 (L) 07/29/2017 0305   K 4.0 07/29/2017 0305   CL 104 07/29/2017 0305   CO2 24 07/29/2017 0305   GLUCOSE 95 07/29/2017 0305   BUN 9 07/29/2017 0305   CREATININE 0.41 (L) 07/29/2017 0305   CALCIUM 7.7 (L) 07/29/2017 0305   PROT 4.5 (L) 07/25/2017 0326   ALBUMIN 1.8 (L) 07/25/2017 0326   AST 14 (L) 07/25/2017 0326   ALT 11 07/25/2017 0326   ALKPHOS 60 07/25/2017 0326   BILITOT 0.5 07/25/2017 0326   GFRNONAA >60 07/29/2017 0305   GFRAA >60 07/29/2017 0305   Lipase     Component Value Date/Time   LIPASE 21 07/14/2017 0943       Studies/Results: No results found.  Anti-infectives: Anti-infectives (From admission, onward)   Start     Dose/Rate Route Frequency Ordered Stop   07/14/17 0900  piperacillin-tazobactam (ZOSYN) IVPB 3.375 g  Status:  Discontinued     3.375 g 12.5 mL/hr over 240 Minutes Intravenous Every 8 hours 07/14/17 0818 07/25/17 1438       Assessment/Plan HTN-Norvasc p.o., Catapres p.o.Hctz PO, Coreg Acute on chronicanemia-hgb/hct 7.5/24 from6.4/19.9 (7/23) -s/p1 unit PRBC7/23; added MVI with iron Tobacco use Chronic pain - robaxin, gabapentin RUL PNA - per CCM, can transition to Augmentin to complete a 14 d total course Abx Severe malnutrition - off TPN   pSBO 2/2 internal hernia s/pLaparoscopic lysis of adhesions,Conversion to exploratory laparotomy with small bowel resection and primary repair of small bowel serosal tear,Completion of prior partial cholecystectomy.7/18 Dr. Redmond Pulling - POD#11 - + bowel function, tol FLD - advance diet - Continue wet to dry dressings to midline wound - JP with 42 cc bloody clear drainage - bile tinged? - Mobilize/IS - d/c PCA and work on PO pain  control  XBO:ERQS diet, added bowel regimen ID: Zosyn 7/14 >7/25; WBC 12.6 repeat CXR, may need to resume PO augmentin for PNA VTE: SCD's, Lovenox Foley:none Follow up: Dr. Redmond Pulling    LOS: 15 days    Brigid Re , [facility] 07/29/2017, 1:32 PM Pager: [phone] Consults: [phone] Mon-Fri 7:00 am-4:30 pm Sat-Sun 7:00 am-11:30 am

## 2017-07-29 NOTE — Progress Notes (Signed)
Gerald Farrell witnessed Gerald Farrell waste 21mg  of dilaudid pca

## 2017-07-29 NOTE — Progress Notes (Signed)
Physical Therapy Treatment Patient Details Name: Gerald Farrell MRN: [id number] DOB: March 02, 1945 Today's Date: 07/29/2017    History of Present Illness Pt is a male 72 y.o. male admitted with SBO and hematemesis.   He underwent open lysis of adhesions, Small bowel resection, completion of chelcystectomy on 7/18. He was found to have lung abcess on CT and underwent brochoscopy.    PMH includes:  ETOH abuse, basal cell carcinoma, HTN, acute cholecystitis, s/p lumbar laminectomy,  s/p TKA     PT Comments    Pt with slow progress due to low hgb and pain. Tolerated 300' ambulation with RW and supervision. Discussed increased frequency of walking for shorter distances during this time of fatigue. Mobility team to check on him today to help with this as well. Reviewed bed mobility for decreasing abdominal and back pain. PT will continue to follow.    Follow Up Recommendations  No PT follow up;Supervision for mobility/OOB     Equipment Recommendations  None recommended by PT    Recommendations for Other Services       Precautions / Restrictions Precautions Precautions: Fall Restrictions Weight Bearing Restrictions: No    Mobility  Bed Mobility Overal bed mobility: Needs Assistance Bed Mobility: Rolling;Sidelying to Sit Rolling: Supervision Sidelying to sit: Supervision       General bed mobility comments: pt tends to try to sit straight up in bed with elbows and has the core strength to do so but causes more pain, encouraged to roll first before sitting up. He has been taught this several times so just reinforced  Transfers Overall transfer level: Needs assistance Equipment used: Rolling walker (2 wheeled) Transfers: Sit to/from Stand Sit to Stand: Supervision         General transfer comment: safe hand placement with use of RW  Ambulation/Gait Ambulation/Gait assistance: Supervision Gait Distance (Feet): 300 Feet Assistive device: Rolling walker (2 wheeled) Gait  Pattern/deviations: Trunk flexed;Step-through pattern Gait velocity: decreased Gait velocity interpretation: 1.31 - 2.62 ft/sec, indicative of limited community ambulator General Gait Details: noted scoliosis but pt attempting to maintain fwd gaze for maximum trunk extension   Stairs             Wheelchair Mobility    Modified Rankin (Stroke Patients Only)       Balance Overall balance assessment: Mild deficits observed, not formally tested                                          Cognition Arousal/Alertness: Awake/alert Behavior During Therapy: WFL for tasks assessed/performed Overall Cognitive Status: Within Functional Limits for tasks assessed                                        Exercises General Exercises - Lower Extremity Ankle Circles/Pumps: AROM;Both;10 reps;Supine Hip Flexion/Marching: AROM;Both;10 reps;Standing Heel Raises: AROM;Both;10 reps;Standing Mini-Sqauts: AROM;Both;10 reps;Standing    General Comments General comments (skin integrity, edema, etc.): VSS on RA. HR up to 125 bpm with ambulation. Pt has been having some abdominal spasms. Discussed mindfulness with active contract/ relax techniques.       Pertinent Vitals/Pain Pain Assessment: Faces Faces Pain Scale: Hurts even more Pain Location: abdomen Pain Descriptors / Indicators: Discomfort;Sore Pain Intervention(s): Limited activity within patient's tolerance;Monitored during session    Home Living  Prior Function            PT Goals (current goals can now be found in the care plan section) Acute Rehab PT Goals Patient Stated Goal: to get back to yoga  PT Goal Formulation: With patient Time For Goal Achievement: 07/30/17 Potential to Achieve Goals: Fair Progress towards PT goals: Progressing toward goals    Frequency    Min 3X/week      PT Plan Current plan remains appropriate    Co-evaluation               AM-PAC PT "6 Clicks" Daily Activity  Outcome Measure  Difficulty turning over in bed (including adjusting bedclothes, sheets and blankets)?: A Little Difficulty moving from lying on back to sitting on the side of the bed? : A Little Difficulty sitting down on and standing up from a chair with arms (e.g., wheelchair, bedside commode, etc,.)?: A Little Help needed moving to and from a bed to chair (including a wheelchair)?: A Little Help needed walking in hospital room?: A Little Help needed climbing 3-5 steps with a railing? : A Little 6 Click Score: 18    End of Session   Activity Tolerance: Patient limited by fatigue Patient left: in bed;with call bell/phone within reach Nurse Communication: Mobility status PT Visit Diagnosis: Other abnormalities of gait and mobility (R26.89);Muscle weakness (generalized) (M62.81)     Time: 4709-2957 PT Time Calculation (min) (ACUTE ONLY): 22 min  Charges:  $Gait Training: 8-22 mins                     Leighton Roach, PT  Acute Rehab Services  [place] 07/29/2017, 12:02 PM

## 2017-07-29 NOTE — Progress Notes (Addendum)
Occupational Therapy Treatment Patient Details Name: Gerald Farrell MRN: [id number] DOB: 10-29-1945 Today's Date: 07/29/2017    History of present illness Pt is a male 72 y.o. male admitted with SBO and hematemesis.   He underwent open lysis of adhesions, Small bowel resection, completion of chelcystectomy on 7/18. He was found to have lung abcess on CT and underwent brochoscopy.    PMH includes:  ETOH abuse, basal cell carcinoma, HTN, acute cholecystitis, s/p lumbar laminectomy,  s/p TKA    OT comments  Patient progressing slowly.  Demonstrating ability to complete transfers with close supervision, toileting with min guard assistance, LB dressing with min guard assist, and grooming with min guard to supervision in standing.  HR increased to 119 while standing to wash hands at sink, RN notified, but decreased appropriately as resting.  He continues to be limited by pain and decreased activity tolerance.  Continued education on safety, precautions, mobility, energy conservation, and ADL compensation.  Will continue to follow while admitted.     Follow Up Recommendations  No OT follow up;Supervision/Assistance - 24 hour    Equipment Recommendations  None recommended by OT    Recommendations for Other Services      Precautions / Restrictions Precautions Precautions: Fall Restrictions Weight Bearing Restrictions: No       Mobility Bed Mobility Overal bed mobility: Needs Assistance Bed Mobility: Rolling;Sidelying to Sit Rolling: Supervision Sidelying to sit: Supervision       General bed mobility comments: seated OOB in recliner   Transfers Overall transfer level: Needs assistance Equipment used: Rolling walker (2 wheeled) Transfers: Sit to/from Stand Sit to Stand: Supervision         General transfer comment: demonstrates good technique and safety    Balance Overall balance assessment: Mild deficits observed, not formally tested                                          ADL either performed or assessed with clinical judgement   ADL Overall ADL's : Needs assistance/impaired     Grooming: Wash/dry hands;Min guard;Supervision/safety;Standing Grooming Details (indicate cue type and reason): min guard initially fading to supervision              Lower Body Dressing: Min guard;Sit to/from stand Lower Body Dressing Details (indicate cue type and reason): able to complete figure 4 technique to manage B socks, educated on technqiue, pacing and safety  Toilet Transfer: Ambulation;RW;Supervision/safety(simulated to recliner ) Armed forces technical officer Details (indicate cue type and reason): good safety awareness and pacing, close supervision for line management and safety  Toileting- Water quality scientist and Hygiene: Min guard;Sit to/from stand;Cueing for safety Toileting - Clothing Manipulation Details (indicate cue type and reason): standing at commode with min guard, cueing for walker safety      Functional mobility during ADLs: Min guard;Rolling walker General ADL Comments: increased time and effort for all tasks today     Vision       Perception     Praxis      Cognition Arousal/Alertness: Awake/alert Behavior During Therapy: WFL for tasks assessed/performed Overall Cognitive Status: Within Functional Limits for tasks assessed                                             Shoulder  Instructions       General Comments VSS, declined removing supplemental oxygen via [place]    Pertinent Vitals/ Pain       Pain Assessment: Faces Faces Pain Scale: Hurts even more Pain Location: abdomen Pain Descriptors / Indicators: Discomfort;Sore;Operative site guarding;Grimacing Pain Intervention(s): Limited activity within patient's tolerance;Repositioned  Home Living                                          Prior Functioning/Environment              Frequency  Min 2X/week        Progress Toward  Goals  OT Goals(current goals can now be found in the care plan section)  Progress towards OT goals: Progressing toward goals  Acute Rehab OT Goals Patient Stated Goal: to get back to yoga  OT Goal Formulation: With patient Time For Goal Achievement: 08/03/17 Potential to Achieve Goals: Good  Plan Discharge plan remains appropriate;Frequency remains appropriate    Co-evaluation                 AM-PAC PT "6 Clicks" Daily Activity     Outcome Measure   Help from another person eating meals?: None Help from another person taking care of personal grooming?: None Help from another person toileting, which includes using toliet, bedpan, or urinal?: A Little Help from another person bathing (including washing, rinsing, drying)?: A Little Help from another person to put on and taking off regular upper body clothing?: A Little Help from another person to put on and taking off regular lower body clothing?: A Little 6 Click Score: 20    End of Session Equipment Utilized During Treatment: Rolling walker  OT Visit Diagnosis: Pain Pain - part of body: (abdomen)   Activity Tolerance Patient limited by pain;Patient limited by fatigue   Patient Left in chair;with call bell/phone within reach   Nurse Communication Mobility status        Time: 0488-8916 OT Time Calculation (min): 20 min  Charges: OT General Charges $OT Visit: 1 Visit OT Treatments $Self Care/Home Management : 8-22 mins  Delight Stare, OTR/L  Pager [place] 07/29/2017, 12:15 PM

## 2017-07-29 NOTE — Plan of Care (Signed)
  Problem: Coping: Goal: Level of anxiety will decrease Outcome: Progressing   Problem: Pain Managment: Goal: General experience of comfort will improve Outcome: Progressing   Problem: Clinical Measurements: Goal: Ability to maintain clinical measurements within normal limits will improve Outcome: Progressing

## 2017-07-30 LAB — CBC
HEMATOCRIT: 26.8 % — AB (ref 39.0–52.0)
Hemoglobin: 8.6 g/dL — ABNORMAL LOW (ref 13.0–17.0)
MCH: 29.6 pg (ref 26.0–34.0)
MCHC: 32.1 g/dL (ref 30.0–36.0)
MCV: 92.1 fL (ref 78.0–100.0)
PLATELETS: 273 10*3/uL (ref 150–400)
RBC: 2.91 MIL/uL — ABNORMAL LOW (ref 4.22–5.81)
RDW: 14.8 % (ref 11.5–15.5)
WBC: 9.8 10*3/uL (ref 4.0–10.5)

## 2017-07-30 LAB — TYPE AND SCREEN
ABO/RH(D): O POS
ANTIBODY SCREEN: NEGATIVE
UNIT DIVISION: 0

## 2017-07-30 LAB — BASIC METABOLIC PANEL
Anion gap: 4 — ABNORMAL LOW (ref 5–15)
BUN: 6 mg/dL — AB (ref 8–23)
CO2: 26 mmol/L (ref 22–32)
Calcium: 8 mg/dL — ABNORMAL LOW (ref 8.9–10.3)
Chloride: 106 mmol/L (ref 98–111)
Creatinine, Ser: 0.42 mg/dL — ABNORMAL LOW (ref 0.61–1.24)
GFR calc Af Amer: >60 mL/min (ref >60)
GLUCOSE: 85 mg/dL (ref 70–99)
Potassium: 3.5 mmol/L (ref 3.5–5.1)
Sodium: 136 mmol/L (ref 135–145)

## 2017-07-30 LAB — BPAM RBC
Blood Product Expiration Date: 201909012359
ISSUE DATE / TIME: 201907291811
Unit Type and Rh: 5100

## 2017-07-30 MED ORDER — ENSURE ENLIVE PO LIQD
237.0000 mL | Freq: Two times a day (BID) | ORAL | Status: DC
  Administered 2017-07-30 – 2017-08-02 (×5): 237 mL via ORAL

## 2017-07-30 MED ORDER — TRAMADOL HCL 50 MG PO TABS
100.0000 mg | ORAL_TABLET | Freq: Every day | ORAL | Status: DC
  Administered 2017-07-30 – 2017-07-31 (×2): 100 mg via ORAL
  Filled 2017-07-30 (×2): qty 2

## 2017-07-30 MED ORDER — TRAMADOL HCL 50 MG PO TABS
50.0000 mg | ORAL_TABLET | Freq: Every day | ORAL | Status: DC
  Administered 2017-07-31 – 2017-08-01 (×2): 50 mg via ORAL
  Filled 2017-07-30 (×2): qty 1

## 2017-07-30 NOTE — Care Management Important Message (Signed)
Important Message  Patient Details  Name: Gerald Farrell MRN: [id number] Date of Birth: 08-Apr-1945   Medicare Important Message Given:  Yes    Brian Kocourek, [place] 07/30/2017, 11:37 AM

## 2017-07-30 NOTE — Progress Notes (Signed)
[facility] Progress Note  12 Days Post-Op  Subjective: CC:  States pain is overall ok off of PCA but increases at night, requiring IV pain meds. Tolerating PO but did not get a tray of SOFT food yesterday. + flatus. Large BM 7/28. Mobilizing. Denies fever, chills, nausea, vomiting.  Objective: Vital signs in last 24 hours: Temp:  [98 F (36.7 C)-98.8 F (37.1 C)] 98.3 F (36.8 C) (07/30 3785) Pulse Rate:  [86-111] 86 (07/30 0633) Resp:  [12-18] 18 (07/30 0633) BP: (116-140)/(67-81) 140/75 (07/30 0633) SpO2:  [98 %-100 %] 100 % (07/30 8850) Weight:  [63 kg (138 lb 12.8 oz)-67.4 kg (148 lb 9.4 oz)] 63 kg (138 lb 12.8 oz) (07/29 1744) Last BM Date: 07/29/17  Intake/Output from previous day: 07/29 0701 - 07/30 0700 In: 350 [Blood:350] Out: 1320 [Urine:1300; Drains:20] Intake/Output this shift: No intake/output data recorded.  PE: Gen:  Alert, NAD, pleasant, sitting up bathing with nurse tech Card:  Regular rate and rhythm Pulm:  Normal effort Abd: Soft, non-tender, non-distended, bowel sounds present, JP with sanguinous drainage  Skin: warm and dry, no rashes  Psych: A&Ox3   Lab Results:  Recent Labs    07/29/17 0305 07/30/17 0429  WBC 12.6* 9.8  HGB 7.5* 8.6*  HCT 24.0* 26.8*  PLT 282 273   BMET Recent Labs    07/29/17 0305 07/30/17 0429  NA 132* 136  K 4.0 3.5  CL 104 106  CO2 24 26  GLUCOSE 95 85  BUN 9 6*  CREATININE 0.41* 0.42*  CALCIUM 7.7* 8.0*   PT/INR No results for input(s): LABPROT, INR in the last 72 hours. CMP     Component Value Date/Time   NA 136 07/30/2017 0429   K 3.5 07/30/2017 0429   CL 106 07/30/2017 0429   CO2 26 07/30/2017 0429   GLUCOSE 85 07/30/2017 0429   BUN 6 (L) 07/30/2017 0429   CREATININE 0.42 (L) 07/30/2017 0429   CALCIUM 8.0 (L) 07/30/2017 0429   PROT 4.5 (L) 07/25/2017 0326   ALBUMIN 1.8 (L) 07/25/2017 0326   AST 14 (L) 07/25/2017 0326   ALT 11 07/25/2017 0326   ALKPHOS 60 07/25/2017 0326   BILITOT 0.5 07/25/2017 0326   GFRNONAA >60 07/30/2017 0429   GFRAA >60 07/30/2017 0429   Lipase     Component Value Date/Time   LIPASE 21 07/14/2017 0943       Studies/Results: Dg Chest Port 1 View  Result Date: 07/29/2017 CLINICAL DATA:  RIGHT upper lobe pneumonia EXAM: PORTABLE CHEST 1 VIEW COMPARISON:  Portable exam 1422 hours compared to 07/26/2017 FINDINGS: RIGHT arm PICC line with tip projecting over SVC. Normal heart size. Apparent enlargement of the RIGHT pulmonary hilum is demonstrated by a recent CT exam to likely represent superimposed opacity in the anterior RIGHT upper lobe. Significant volume loss at RIGHT base since previous exam with elevation of RIGHT diaphragm and RIGHT basilar atelectasis. Bronchitic changes centrally with minimal atelectasis at LEFT base. Remaining lungs clear. No pleural effusion or pneumothorax. Bones unremarkable. IMPRESSION: Significant increase in RIGHT basilar atelectasis and volume loss since previous exam with elevation of RIGHT diaphragm. Persistent RIGHT upper lobe opacity as seen on a recent CT chest; radiographic followup until resolution recommended to exclude underlying abnormalities including pulmonary mass. Electronically Signed   By: Lavonia Dana M.D.   On: 07/29/2017 14:42    Anti-infectives: Anti-infectives (From admission, onward)   Start     Dose/Rate Route Frequency Ordered Stop   07/29/17  1700  amoxicillin-clavulanate (AUGMENTIN) 875-125 MG per tablet 1 tablet     1 tablet Oral Every 12 hours 07/29/17 1605     07/14/17 0900  piperacillin-tazobactam (ZOSYN) IVPB 3.375 g  Status:  Discontinued     3.375 g 12.5 mL/hr over 240 Minutes Intravenous Every 8 hours 07/14/17 0818 07/25/17 1438     Assessment/Plan HTN-Norvasc p.o., Catapres p.o.Hctz PO, Coreg Acute on chronicanemia-hgb/hct 7.5/24 from6.4/19.9 (7/23) -s/p1 unit PRBC7/23; added MVI with iron Tobacco use Chronic pain - robaxin, gabapentin RUL PNA - per CCM, can  transition to Augmentin to complete a 14 d total course Abx Severe malnutrition - off TPN   pSBO 2/2 internal hernia s/pLaparoscopic lysis of adhesions,Conversion to exploratory laparotomy with small bowel resection and primary repair of small bowel serosal tear,Completion of prior partial cholecystectomy.7/18 Dr. Redmond Pulling - POD#12 - + bowel function, tol FLD - advance diet to SOFT  - Continue wet to dry dressings to midline wound - JP with 20 cc bloody drainage - Mobilize/IS -  PO pain control: tylenol, Neurontin, PRN oxy, scheduled ULTRAM - increase to 100 mg in the evening at bedtime.  NID:POEU diet, bowel regimen ID: Zosyn 7/14 >7/25;  Augmentin 7/29 >> for RUL PNA. Leukocytosis resolved.  VTE: SCD's, Lovenox Foley:none Follow up: Dr. Redmond Pulling    LOS: 32 days    Jill Alexanders , [facility] Surgery 07/30/2017, 10:37 AM Pager: [phone] Consults: [phone] Mon-Fri 7:00 am-4:30 pm Sat-Sun 7:00 am-11:30 am

## 2017-07-30 NOTE — Progress Notes (Addendum)
Nutrition Follow-up  DOCUMENTATION CODES:   Severe malnutrition in context of acute illness/injury  INTERVENTION:   -D/c Boost Breeze po TID, each supplement provides 250 kcal and 9 grams of protein -Ensure Enlive po BID, each supplement provides 350 kcal and 20 grams of protein -MVI with minerals daily -Food preferences obtained and entered into meal ordering system  NUTRITION DIAGNOSIS:   Moderate Malnutrition related to acute illness(gangrenous cholecystits) as evidenced by energy intake < or equal to 50% for > or equal to 5 days, mild fat depletion, moderate fat depletion, mild muscle depletion, moderate muscle depletion.  Ongoing  GOAL:   Patient will meet greater than or equal to 90% of their needs  Progressing  MONITOR:   PO intake, Supplement acceptance, Labs, Weight trends, Skin, I & O's  REASON FOR ASSESSMENT:   NPO/Clear Liquid Diet, Consult New TPN/TNA  ASSESSMENT:   [name] with history of hypertension, tobacco use and recent melanoma removal status post laparoscopic partial cholecystectomy April 29, 2017 for gangrenous cholecystitis was transferred from [facility] for abdominal pain, small bowel obstruction and hematemesis.  7/14- NGT placed 7/18- s/p laparoscopic lysis of adhesions, ex lap with SBR and primary repair to small bowel serosal tear, and completion of prior partial cholecystectomy 7/22- PICC placed, TPNinitiated 7/25- NGT removed 7/26- advanced to full liquid diet 7/28- TPN d/c  Reviewed I/O's: -970 ml x 24 hours and +2.7 L since 07/16/17  Case discussed with RN prior to visit. She reports that pt just received a soft diet this morning, however, ate very little as pt is a very selective eater (does not like [place]- preferences updated in meal ordering system). Noted mea completion 10-25% Observed in meal ordering system that pt ordered a tuna salad sandwich for lunch.   Pt sleeping soundly at time of  visit. RD did not disturb. Noted Boost Breeze supplement at bedside- pt consumed about 25%. RD will try Ensure supplements due to diet advancement and increased nutrient density.  Labs reviewed.   Diet Order:   Diet Order           DIET SOFT Room service appropriate? Yes; Fluid consistency: Thin  Diet effective now          EDUCATION NEEDS:   Education needs have been addressed  Skin:  Skin Assessment: Skin Integrity Issues: Skin Integrity Issues:: Incisions Incisions: closed abdomen  Last BM:  07/29/17  Height:   Ht Readings from Last 1 Encounters:  07/14/17 5\' 9"  (1.753 m)    Weight:   Wt Readings from Last 1 Encounters:  07/29/17 138 lb 12.8 oz (63 kg)    Ideal Body Weight:  72.7 kg  BMI:  Body mass index is 20.5 kg/m.  Estimated Nutritional Needs:   Kcal:  1900-2100  Protein:  90-105 grams  Fluid:  >1.9 L    Gerald Farrell A. Jimmye Norman, RD, LDN, CDE Pager: [phone] After hours Pager: [phone]

## 2017-07-31 NOTE — Progress Notes (Signed)
Occupational Therapy Treatment Patient Details Name: Gerald Farrell MRN: [id number] DOB: 1945/11/28 Today's Date: 07/31/2017    History of present illness Pt is a male 72 y.o. male admitted with SBO and hematemesis.   He underwent open lysis of adhesions, Small bowel resection, completion of chelcystectomy on 7/18. He was found to have lung abcess on CT and underwent brochoscopy.    PMH includes:  ETOH abuse, basal cell carcinoma, HTN, acute cholecystitis, s/p lumbar laminectomy,  s/p TKA    OT comments  Patient progressing well.  Completing bed mobility, transfers and short distance mobility using RW with supervision; intermittent cueing for safety with RW management during transfers.  Patient had +BM (loose), requiring increased time to complete hygiene and bathe standing at sink for approximately 15 minutes without rest break given minimal assistance. Will continue to follow while admitted.      Follow Up Recommendations  No OT follow up;Supervision/Assistance - 24 hour    Equipment Recommendations  None recommended by OT    Recommendations for Other Services      Precautions / Restrictions Precautions Precautions: Fall Restrictions Weight Bearing Restrictions: No       Mobility Bed Mobility Overal bed mobility: Needs Assistance Bed Mobility: Supine to Sit;Sit to Supine     Supine to sit: HOB elevated;Supervision Sit to supine: HOB elevated;Supervision   General bed mobility comments: use of rails, no physical assistance  Transfers Overall transfer level: Needs assistance Equipment used: Rolling walker (2 wheeled) Transfers: Sit to/from Stand Sit to Stand: Supervision         General transfer comment: demonstrates good technique, cueing for walker mgmt safety     Balance Overall balance assessment: Mild deficits observed, not formally tested                                         ADL either performed or assessed with clinical judgement    ADL Overall ADL's : Needs assistance/impaired     Grooming: Wash/dry hands;Supervision/safety;Standing       Lower Body Bathing: Minimal assistance;Sit to/from stand;Cueing for safety Lower Body Bathing Details (indicate cue type and reason): min assist to complete hygiene after loose BM         Toilet Transfer: Supervision/safety;Cueing for safety;RW;Comfort height toilet Toilet Transfer Details (indicate cue type and reason): cueing for safety and walker management  Toileting- Clothing Manipulation and Hygiene: Minimal assistance;Sit to/from stand Toileting - Clothing Manipulation Details (indicate cue type and reason): min assist for hygiene after +BM      Functional mobility during ADLs: Supervision/safety;Rolling walker;Cueing for safety General ADL Comments: patient demosntrating increased standing tolerance and balance while completing ADLS     Vision       Perception     Praxis      Cognition Arousal/Alertness: Awake/alert Behavior During Therapy: WFL for tasks assessed/performed Overall Cognitive Status: Within Functional Limits for tasks assessed                                          Exercises     Shoulder Instructions       General Comments VSS    Pertinent Vitals/ Pain       Pain Assessment: Faces Faces Pain Scale: Hurts little more Pain Location: abdomen, low back Pain Descriptors /  Indicators: Discomfort;Sore;Operative site guarding;Grimacing Pain Intervention(s): Limited activity within patient's tolerance;Repositioned  Home Living                                          Prior Functioning/Environment              Frequency  Min 2X/week        Progress Toward Goals  OT Goals(current goals can now be found in the care plan section)  Progress towards OT goals: Progressing toward goals  Acute Rehab OT Goals Patient Stated Goal: to get back to yoga  OT Goal Formulation: With patient Time  For Goal Achievement: 08/03/17  Plan Discharge plan remains appropriate;Frequency remains appropriate    Co-evaluation                 AM-PAC PT "6 Clicks" Daily Activity     Outcome Measure   Help from another person eating meals?: None Help from another person taking care of personal grooming?: None Help from another person toileting, which includes using toliet, bedpan, or urinal?: A Little Help from another person bathing (including washing, rinsing, drying)?: A Little Help from another person to put on and taking off regular upper body clothing?: None Help from another person to put on and taking off regular lower body clothing?: A Little 6 Click Score: 21    End of Session Equipment Utilized During Treatment: Rolling walker  OT Visit Diagnosis: Pain Pain - part of body: (abdomen)   Activity Tolerance Patient tolerated treatment well   Patient Left with call bell/phone within reach;in bed   Nurse Communication Mobility status(+Bm)        Time: 5465-6812 OT Time Calculation (min): 32 min  Charges: OT General Charges $OT Visit: 1 Visit OT Treatments $Self Care/Home Management : 23-37 mins  Delight Stare, OTR/L  Pager [phone]    Delight Stare 07/31/2017, 11:22 AM

## 2017-07-31 NOTE — Progress Notes (Signed)
[facility] Progress Note  13 Days Post-Op  Subjective: CC: incisional pain Patient reports some incisional pain currently but has not had anything for pain in a while. Overall pain better controlled.  Tolerating diet, having bowel function. Plans to discharge home with son.   Objective: Vital signs in last 24 hours: Temp:  [98.2 F (36.8 C)-99.1 F (37.3 C)] 99.1 F (37.3 C) (07/31 0450) Pulse Rate:  [92-105] 93 (07/31 0450) Resp:  [14-16] 14 (07/31 0450) BP: (134-167)/(78-92) 154/84 (07/31 0450) SpO2:  [99 %] 99 % (07/31 0450) Last BM Date: 07/29/17  Intake/Output from previous day: 07/30 0701 - 07/31 0700 In: 180 [P.O.:180] Out: 1028 [Urine:1000; Drains:28] Intake/Output this shift: Total I/O In: 577 [P.O.:577] Out: 350 [Urine:350]  PE: Gen:  Alert, NAD, pleasant Card:  Regular rate and rhythm Pulm:  Normal effort Abd: Soft, non-tender, non-distended, bowel sounds present, JP with sanguinous drainage  Skin: warm and dry, no rashes  Psych: A&Ox3      Lab Results:  Recent Labs    07/29/17 0305 07/30/17 0429  WBC 12.6* 9.8  HGB 7.5* 8.6*  HCT 24.0* 26.8*  PLT 282 273   BMET Recent Labs    07/29/17 0305 07/30/17 0429  NA 132* 136  K 4.0 3.5  CL 104 106  CO2 24 26  GLUCOSE 95 85  BUN 9 6*  CREATININE 0.41* 0.42*  CALCIUM 7.7* 8.0*   PT/INR No results for input(s): LABPROT, INR in the last 72 hours. CMP     Component Value Date/Time   NA 136 07/30/2017 0429   K 3.5 07/30/2017 0429   CL 106 07/30/2017 0429   CO2 26 07/30/2017 0429   GLUCOSE 85 07/30/2017 0429   BUN 6 (L) 07/30/2017 0429   CREATININE 0.42 (L) 07/30/2017 0429   CALCIUM 8.0 (L) 07/30/2017 0429   PROT 4.5 (L) 07/25/2017 0326   ALBUMIN 1.8 (L) 07/25/2017 0326   AST 14 (L) 07/25/2017 0326   ALT 11 07/25/2017 0326   ALKPHOS 60 07/25/2017 0326   BILITOT 0.5 07/25/2017 0326   GFRNONAA >60 07/30/2017 0429   GFRAA >60 07/30/2017 0429   Lipase     Component Value  Date/Time   LIPASE 21 07/14/2017 0943       Studies/Results: Dg Chest Port 1 View  Result Date: 07/29/2017 CLINICAL DATA:  RIGHT upper lobe pneumonia EXAM: PORTABLE CHEST 1 VIEW COMPARISON:  Portable exam 1422 hours compared to 07/26/2017 FINDINGS: RIGHT arm PICC line with tip projecting over SVC. Normal heart size. Apparent enlargement of the RIGHT pulmonary hilum is demonstrated by a recent CT exam to likely represent superimposed opacity in the anterior RIGHT upper lobe. Significant volume loss at RIGHT base since previous exam with elevation of RIGHT diaphragm and RIGHT basilar atelectasis. Bronchitic changes centrally with minimal atelectasis at LEFT base. Remaining lungs clear. No pleural effusion or pneumothorax. Bones unremarkable. IMPRESSION: Significant increase in RIGHT basilar atelectasis and volume loss since previous exam with elevation of RIGHT diaphragm. Persistent RIGHT upper lobe opacity as seen on a recent CT chest; radiographic followup until resolution recommended to exclude underlying abnormalities including pulmonary mass. Electronically Signed   By: Lavonia Dana M.D.   On: 07/29/2017 14:42    Anti-infectives: Anti-infectives (From admission, onward)   Start     Dose/Rate Route Frequency Ordered Stop   07/29/17 1700  amoxicillin-clavulanate (AUGMENTIN) 875-125 MG per tablet 1 tablet     1 tablet Oral Every 12 hours 07/29/17 1605  07/14/17 0900  piperacillin-tazobactam (ZOSYN) IVPB 3.375 g  Status:  Discontinued     3.375 g 12.5 mL/hr over 240 Minutes Intravenous Every 8 hours 07/14/17 0818 07/25/17 1438       Assessment/Plan HTN-Norvasc p.o., Catapres p.o.Hctz PO, Coreg Acute on chronicanemia-hgb/hct 8.6/26.8 (7/30) -s/p1 unit PRBC7/23 and 1 unit PRBC 7/29; continue MVI with iron Tobacco use Chronic pain - robaxin, gabapentin RUL PNA - per CCM,can transition to Augmentin to complete a 14 d total course Abx Severe malnutrition -off TPN  pSBO 2/2  internal hernia s/pLaparoscopic lysis of adhesions,Conversion to exploratory laparotomy with small bowel resection and primary repair of small bowel serosal tear,Completion of prior partial cholecystectomy.7/18 Dr. Redmond Pulling - POD#13 - + bowel function, tol SOFT  - Continue wet to dry dressings to midline wound - JP with 20 ccbloody drainage - likely d/c tomorrow - Mobilize/IS -  PO pain control: tylenol, Neurontin, PRN oxy, scheduled ULTRAM - 100 mg in the evening at bedtime.  FKC:LEXN diet, bowel regimen ID: Zosyn 7/14 >7/25;  Augmentin 7/29 >> for RUL PNA. Leukocytosis resolved.  VTE: SCD's, Lovenox Foley:none Follow up: Dr. Redmond Pulling  Plan to discharge tomorrow as long as patient still progressing.    LOS: 17 days    Brigid Re , [facility] Surgery 07/31/2017, 11:40 AM Pager: [phone] Consults: [phone] Mon-Fri 7:00 am-4:30 pm Sat-Sun 7:00 am-11:30 am

## 2017-07-31 NOTE — Progress Notes (Signed)
Physical Therapy Treatment Patient Details Name: Gerald Farrell MRN: [id number] DOB: July 03, 1945 Today's Date: 07/31/2017    History of Present Illness Pt is a male 72 y.o. male admitted with SBO and hematemesis.   He underwent open lysis of adhesions, Small bowel resection, completion of chelcystectomy on 7/18. He was found to have lung abcess on CT and underwent brochoscopy.    PMH includes:  ETOH abuse, basal cell carcinoma, HTN, acute cholecystitis, s/p lumbar laminectomy,  s/p TKA     PT Comments    Pt motivated to participate in therapy. Ambulated 500 ft with RW supervision. PT to continue per POC.    Follow Up Recommendations  No PT follow up;Supervision for mobility/OOB     Equipment Recommendations  None recommended by PT    Recommendations for Other Services       Precautions / Restrictions Precautions Precautions: Fall;Other (comment) Precaution Comments: 1 drain Restrictions Weight Bearing Restrictions: No    Mobility  Bed Mobility Overal bed mobility: Needs Assistance Bed Mobility: Supine to Sit;Sit to Supine     Supine to sit: HOB elevated;Supervision Sit to supine: HOB elevated;Supervision   General bed mobility comments: +rail, supervision for safety  Transfers Overall transfer level: Needs assistance Equipment used: Rolling walker (2 wheeled) Transfers: Sit to/from Stand Sit to Stand: Supervision Stand pivot transfers: Supervision       General transfer comment: supervision for safety  Ambulation/Gait Ambulation/Gait assistance: Supervision Gait Distance (Feet): 500 Feet Assistive device: Rolling walker (2 wheeled) Gait Pattern/deviations: Trunk flexed;Step-through pattern;Decreased stride length Gait velocity: decreased Gait velocity interpretation: 1.31 - 2.62 ft/sec, indicative of limited community ambulator General Gait Details: pt with scoliosis. Cues to maximize trunk extension and look forward.   Stairs              Wheelchair Mobility    Modified Rankin (Stroke Patients Only)       Balance Overall balance assessment: Mild deficits observed, not formally tested                                          Cognition Arousal/Alertness: Awake/alert Behavior During Therapy: WFL for tasks assessed/performed Overall Cognitive Status: Within Functional Limits for tasks assessed                                        Exercises      General Comments General comments (skin integrity, edema, etc.): VSS      Pertinent Vitals/Pain Pain Assessment: Faces Faces Pain Scale: Hurts a little bit Pain Location: abdomen, low back Pain Descriptors / Indicators: Grimacing;Sore Pain Intervention(s): Premedicated before session;Monitored during session    Home Living                      Prior Function            PT Goals (current goals can now be found in the care plan section) Acute Rehab PT Goals Patient Stated Goal: to get back to yoga  PT Goal Formulation: With patient Time For Goal Achievement: 08/13/17 Potential to Achieve Goals: Fair Progress towards PT goals: Progressing toward goals    Frequency    Min 3X/week      PT Plan Current plan remains appropriate    Co-evaluation  AM-PAC PT "6 Clicks" Daily Activity  Outcome Measure  Difficulty turning over in bed (including adjusting bedclothes, sheets and blankets)?: A Little Difficulty moving from lying on back to sitting on the side of the bed? : A Little Difficulty sitting down on and standing up from a chair with arms (e.g., wheelchair, bedside commode, etc,.)?: A Little Help needed moving to and from a bed to chair (including a wheelchair)?: None Help needed walking in hospital room?: A Little Help needed climbing 3-5 steps with a railing? : A Little 6 Click Score: 19    End of Session Equipment Utilized During Treatment: Gait belt Activity Tolerance: Patient  tolerated treatment well Patient left: in bed;with call bell/phone within reach Nurse Communication: Mobility status PT Visit Diagnosis: Other abnormalities of gait and mobility (R26.89);Muscle weakness (generalized) (M62.81)     Time: 6144-3154 PT Time Calculation (min) (ACUTE ONLY): 16 min  Charges:  $Gait Training: 8-22 mins                     Lorrin Goodell, PT  Office # [phone] Pager [phone]    Lorriane Shire 07/31/2017, 1:17 PM

## 2017-08-01 MED ORDER — METHOCARBAMOL 750 MG PO TABS
750.0000 mg | ORAL_TABLET | Freq: Three times a day (TID) | ORAL | Status: DC
Start: 2017-08-01 — End: 2017-08-02
  Administered 2017-08-01 – 2017-08-02 (×3): 750 mg via ORAL
  Filled 2017-08-01 (×3): qty 1

## 2017-08-01 MED ORDER — OXYCODONE HCL 5 MG PO TABS
5.0000 mg | ORAL_TABLET | Freq: Three times a day (TID) | ORAL | Status: DC | PRN
  Administered 2017-08-01 – 2017-08-02 (×3): 5 mg via ORAL
  Filled 2017-08-01 (×3): qty 1

## 2017-08-01 MED ORDER — LORAZEPAM 0.5 MG PO TABS: ORAL_TABLET | ORAL | 0 refills | Status: DC

## 2017-08-01 MED ORDER — OXYCODONE HCL 5 MG PO TABS: 5.0000 mg | ORAL_TABLET | Freq: Three times a day (TID) | ORAL | Status: DC

## 2017-08-01 MED ORDER — NICOTINE 14 MG/24HR TD PT24: MEDICATED_PATCH | TRANSDERMAL | 0 refills | Status: DC

## 2017-08-01 MED ORDER — OXYCODONE HCL 5 MG PO CAPS: ORAL_CAPSULE | ORAL | 0 refills | Status: DC

## 2017-08-01 MED ORDER — TRAMADOL HCL 50 MG PO TABS
50.0000 mg | ORAL_TABLET | Freq: Four times a day (QID) | ORAL | Status: DC | PRN
  Administered 2017-08-01 – 2017-08-02 (×3): 50 mg via ORAL
  Filled 2017-08-01 (×3): qty 1

## 2017-08-01 MED ORDER — OXYCODONE HCL 5 MG PO TABS: 5.0000 mg | ORAL_TABLET | Freq: Three times a day (TID) | ORAL | Status: DC | PRN

## 2017-08-01 MED ORDER — MELOXICAM 7.5 MG PO TABS
15.0000 mg | ORAL_TABLET | Freq: Every day | ORAL | Status: DC
  Administered 2017-08-01 – 2017-08-02 (×2): 15 mg via ORAL
  Filled 2017-08-01 (×2): qty 2

## 2017-08-01 MED ORDER — LISINOPRIL 40 MG PO TABS
40.0000 mg | ORAL_TABLET | Freq: Every day | ORAL | Status: DC
  Administered 2017-08-01 – 2017-08-02 (×2): 40 mg via ORAL
  Filled 2017-08-01 (×2): qty 1

## 2017-08-01 MED ORDER — TAMSULOSIN HCL 0.4 MG PO CAPS
0.4000 mg | ORAL_CAPSULE | Freq: Every day | ORAL | Status: DC
  Administered 2017-08-01 – 2017-08-02 (×2): 0.4 mg via ORAL
  Filled 2017-08-01 (×2): qty 1

## 2017-08-01 MED ORDER — IBUPROFEN 400 MG PO TABS: 400.0000 mg | ORAL_TABLET | Freq: Four times a day (QID) | ORAL | Status: DC | PRN

## 2017-08-01 MED ORDER — AMOXICILLIN-POT CLAVULANATE 875-125 MG PO TABS: 1.0000 | ORAL_TABLET | Freq: Two times a day (BID) | ORAL | 0 refills | Status: DC

## 2017-08-01 MED ORDER — TRAMADOL HCL 50 MG PO TABS: ORAL_TABLET | ORAL | 0 refills | Status: DC

## 2017-08-01 MED ORDER — TAB-A-VITE/IRON PO TABS: ORAL_TABLET | ORAL | 0 refills | Status: DC

## 2017-08-01 MED ORDER — LISINOPRIL 40 MG PO TABS: ORAL_TABLET | ORAL | Status: DC

## 2017-08-01 MED ORDER — CARVEDILOL 25 MG PO TABS: ORAL_TABLET | ORAL | Status: DC

## 2017-08-01 MED ORDER — POLYETHYLENE GLYCOL 3350 17 G PO PACK: PACK | ORAL | 0 refills | Status: DC

## 2017-08-01 MED ORDER — ACETAMINOPHEN 500 MG PO TABS: ORAL_TABLET | ORAL | 0 refills | Status: DC

## 2017-08-01 MED ORDER — LORAZEPAM 0.5 MG PO TABS
0.5000 mg | ORAL_TABLET | Freq: Three times a day (TID) | ORAL | Status: DC | PRN
  Administered 2017-08-02: 0.5 mg via ORAL
  Filled 2017-08-01: qty 1

## 2017-08-01 NOTE — Progress Notes (Addendum)
14 Days Post-Op    CC: abdominal pain/SBO  Subjective: He looks good anxious as before. I pulled all his sutures, staples in his drain.  His open site looks good.  He had some serous drainage after I pulled the drain but it stable.  He is on a number of medicines to control his anxiety and pain issues.  He has chronic pain issues along with acute.  Objective: Vital signs in last 24 hours: Temp:  [98.8 F (37.1 C)-99.8 F (37.7 C)] 99.8 F (37.7 C) (08/01 0536) Pulse Rate:  [91-101] 97 (08/01 0536) Resp:  [13-15] 14 (08/01 0536) BP: (127-169)/(71-90) 127/71 (08/01 0536) SpO2:  [93 %-99 %] 93 % (08/01 0536) Last BM Date: 07/29/17 1417 PO No IV fluids Drain 5 cc Stool x 1 Afebrile, VSS Labs are normal as of 07/30/17 Still taking dilaudid/neurontin/tylenol/ativan/oxycdone/Tramadol  Intake/Output from previous day: 07/31 0701 - 08/01 0700 In: 1417 [P.O.:1417] Out: 1936 [Urine:1930; Drains:5; Stool:1] Intake/Output this shift: Total I/O In: -  Out: 400 [Urine:400]  General appearance: alert, cooperative, no distress and still pretty anxious Resp: clear to auscultation bilaterally Cardio: regular rate and rhythm, S1, S2 normal, no murmur, click, rub or gallop GI: soft, non-tender; bowel sounds normal; no masses,  no organomegaly and open site looks fine, I took out his staples, drain and sutures/ Extremities: extremities normal, atraumatic, no cyanosis or edema  Lab Results:  Recent Labs    07/30/17 0429  WBC 9.8  HGB 8.6*  HCT 26.8*  PLT 273    BMET Recent Labs    07/30/17 0429  NA 136  K 3.5  CL 106  CO2 26  GLUCOSE 85  BUN 6*  CREATININE 0.42*  CALCIUM 8.0*   PT/INR No results for input(s): LABPROT, INR in the last 72 hours.  No results for input(s): AST, ALT, ALKPHOS, BILITOT, PROT, ALBUMIN in the last 168 hours.   Lipase     Component Value Date/Time   LIPASE 21 07/14/2017 0943     Medications: . sodium chloride   Intravenous Once  .  acetaminophen  1,000 mg Oral Q8H  . amLODipine  5 mg Oral Daily  . amoxicillin-clavulanate  1 tablet Oral Q12H  . chlorhexidine  15 mL Mouth Rinse BID  . cloNIDine  0.1 mg Transdermal Weekly  . docusate sodium  100 mg Oral BID  . enoxaparin (LOVENOX) injection  40 mg Subcutaneous Q24H  . famotidine  20 mg Oral BID  . feeding supplement (ENSURE ENLIVE)  237 mL Oral BID BM  . gabapentin  800 mg Oral TID  . guaiFENesin  15 mL Oral TID  . lisinopril  40 mg Oral Daily  . methocarbamol  1,000 mg Oral TID  . multivitamins with iron  1 tablet Oral Daily  . nicotine  14 mg Transdermal Daily  . traMADol  100 mg Oral QHS  . traMADol  50 mg Oral Daily   Anti-infectives (From admission, onward)   Start     Dose/Rate Route Frequency Ordered Stop   07/29/17 1700  amoxicillin-clavulanate (AUGMENTIN) 875-125 MG per tablet 1 tablet     1 tablet Oral Every 12 hours 07/29/17 1605     07/14/17 0900  piperacillin-tazobactam (ZOSYN) IVPB 3.375 g  Status:  Discontinued     3.375 g 12.5 mL/hr over 240 Minutes Intravenous Every 8 hours 07/14/17 0818 07/25/17 1438     Prior to Admission medications   Medication Sig Start Date End Date Taking? Authorizing Provider  acetaminophen (TYLENOL)  325 MG tablet Take 650 mg by mouth every 6 (six) hours as needed for moderate pain or headache.   Yes [provider]  amLODipine (NORVASC) 10 MG tablet Take 5 mg by mouth daily.   Yes [provider]  carvedilol (COREG) 25 MG tablet Take 25 mg by mouth 2 (two) times daily with a meal.   Yes [provider]  cloNIDine (CATAPRES) 0.1 MG tablet Take 0.1 mg by mouth 3 (three) times daily.   Yes [provider]  ENSURE (ENSURE) Take 0.5 Cans by mouth 3 (three) times daily between meals.   Yes [provider]  gabapentin (NEURONTIN) 400 MG capsule Take 800 mg by mouth 3 (three) times daily.   Yes [provider]  hydrochlorothiazide (HYDRODIURIL) 25 MG tablet Take 25 mg by  mouth daily.   Yes [provider]  ibuprofen (ADVIL,MOTRIN) 200 MG tablet Take 600 mg by mouth every 6 (six) hours as needed for moderate pain.    Yes [provider]  ketoconazole (NIZORAL) 2 % cream Apply 1 application topically daily as needed for irritation.   Yes [provider]  lisinopril (PRINIVIL,ZESTRIL) 40 MG tablet Take 40 mg by mouth 2 (two) times daily.    Yes [provider]  meloxicam (MOBIC) 15 MG tablet Take 15 mg by mouth daily.   Yes [provider]  methocarbamol (ROBAXIN) 750 MG tablet Take 750 mg by mouth 3 (three) times daily as needed for muscle spasms.    Yes [provider]  omeprazole (PRILOSEC) 20 MG capsule Take 40 mg by mouth daily.    Yes [provider]  oxycodone (OXY-IR) 5 MG capsule Take 5 mg by mouth 3 (three) times daily as needed for pain.   Yes [provider]  Potassium Chloride ER 20 MEQ TBCR Take 20 mEq by mouth daily. 06/21/17  Yes Gatha Mayer, MD  tamsulosin (FLOMAX) 0.4 MG CAPS capsule Take 0.4 mg by mouth daily.   Yes [provider]    Assessment/Plan  HTN-Norvasc p.o., Catapres p.o.Hctz PO, Coreg Acute on chronicanemia-hgb/hct 8.6/26.8 (7/30) -s/p1 unit PRBC7/23 and 1 unit PRBC 7/29; continue MVI with iron Tobacco use Chronic pain - robaxin, gabapentin, oxycodone RUL PNA - per CCM,can transition to Augmentin to complete a 14 d total course Abx - follow up DR. Byrum 8/13 Severe malnutrition -off TPN  pSBO 2/2 internal hernia s/pLaparoscopic lysis of adhesions,Conversion to exploratory laparotomy with small bowel resection and primary repair of small bowel serosal tear,Completion of prior partial cholecystectomy.7/18 Dr. Redmond Pulling - POD#14 - + bowel function, tol SOFT - Continue wet to dry dressings to midline wound - JP with 20ccbloody drainage - likely d/c tomorrow - Mobilize/IS -PO pain control: tylenol, Neurontin, PRN oxy,  scheduled ULTRAM - 100 mg in the evening at bedtime.  ELF:YBOF diet, bowel regimen ID: Zosyn 7/14 >7/25;Augmentin 7/29 >> for RUL PNA. Leukocytosis resolved>>day 4/14 VTE: SCD's, Lovenox Foley:none Follow up: Dr. Redmond Pulling  Plan:  I have cut down his pain meds.  He was on Oxycodone, Robaxin at home for chronic back pain provided by the [place], I have reduced these to pre-admit dosings.  Pulled his drain, staples and sutures.  Changed his IV ativan to PO at half the dose, decreased his Tramadol to 50 mg q6h. Restart Mobic as before.  If he does well he can go home tomorrow.  I told him we would give him the Tramadol but the oxycodone he would need to get from  the [place].  I have personally reviewed the patients medication history on the [place] controlled substance database.  I have him pretty well set up to go tomorrow if he does well with new changes to pain regime.       LOS: 18 days    Gerald Farrell 08/01/2017 [phone]

## 2017-08-01 NOTE — Discharge Instructions (Addendum)
[facility] Stay Proper nutrition can help your body recover from illness and injury.   Foods and beverages high in protein, vitamins, and minerals help rebuild muscle loss, promote healing, & reduce fall risk.   In addition to eating healthy foods, a nutrition shake is an easy, delicious way to get the nutrition you need during and after your hospital stay  It is recommended that you continue to drink 2 bottles per day of:   Ensure Plus for at least 1 month (30 days) after your hospital stay   Tips for adding a nutrition shake into your routine: As allowed, drink one with vitamins or medications instead of water or juice Enjoy one as a tasty mid-morning or afternoon snack Drink cold or make a milkshake out of it Drink one instead of milk with cereal or snacks Use as a coffee creamer   Available at the following grocery stores and pharmacies:           * [place] [phone]            For COUPONS visit: www.ensure.com/join or [email or web address]   Suggested Substitutions Ensure Plus = Boost Plus = Carnation Breakfast Essentials = Boost Compact Ensure Active Clear = Boost Breeze Glucerna Shake = Boost Glucose Control = Carnation Breakfast Essentials SUGAR FREE  CCS      [facility], [place] [phone]  OPEN ABDOMINAL SURGERY: POST OP INSTRUCTIONS  Always review your discharge instruction sheet given to you by the facility where your surgery was performed.  IF YOU HAVE DISABILITY OR FAMILY LEAVE FORMS, YOU MUST BRING THEM TO THE OFFICE FOR PROCESSING.  PLEASE DO NOT GIVE THEM TO YOUR DOCTOR.  1. A prescription for pain medication may be given to you upon discharge.  Take your pain medication as prescribed, if needed.  If narcotic pain medicine is not needed, then you may take  acetaminophen (Tylenol) or ibuprofen (Advil) as needed. 2. Take your usually prescribed medications unless otherwise directed. 3. If you need a refill on your pain medication, please contact your pharmacy. They will contact our office to request authorization.  Prescriptions will not be filled after 5pm or on week-ends. 4. You should follow a light diet the first few days after arrival home, such as soup and crackers, pudding, etc.unless your doctor has advised otherwise. A high-fiber, low fat diet can be resumed as tolerated.   Be sure to include lots of fluids daily. Most patients will experience some swelling and bruising on the chest and neck area.  Ice packs will help.  Swelling and bruising can take several days to resolve 5. Most patients will experience some swelling and bruising in the area of the incision. Ice pack will help. Swelling and bruising can take several days to resolve..  6. It is common to experience some constipation if taking pain medication after surgery.  Increasing fluid intake and taking a stool softener will usually help or prevent this problem from occurring.  A mild laxative (Milk of Magnesia or Miralax) should be taken according to package directions if there are no bowel movements after 48 hours. 7.  You may have steri-strips (small skin tapes) in place directly over the incision.  These strips should be left on the skin for 7-10 days.  If your surgeon used skin glue on the incision, you may shower in 24 hours.  The glue will flake off over the next 2-3 weeks.  Any sutures or staples will be removed at the office during your follow-up visit. You may find that a light gauze bandage over your incision may keep your staples from being rubbed or pulled. You may shower and replace the bandage daily. 8. ACTIVITIES:  You may resume regular (light) daily activities beginning the next day--such as daily self-care, walking, climbing stairs--gradually increasing activities as tolerated.   You may have sexual intercourse when it is comfortable.  Refrain from any heavy lifting or straining until approved by your doctor. a. You may drive when you no longer are taking prescription pain medication, you can comfortably wear a seatbelt, and you can safely maneuver your car and apply brakes b. Return to Work: ___________________________________ 62. You should see your doctor in the office for a follow-up appointment approximately two weeks after your surgery.  Make sure that you call for this appointment within a day or two after you arrive home to insure a convenient appointment time. OTHER INSTRUCTIONS:  _____________________________________________________________ _____________________________________________________________  WHEN TO CALL YOUR DOCTOR: 1. Fever over 101.0 2. Inability to urinate 3. Nausea and/or vomiting 4. Extreme swelling or bruising 5. Continued bleeding from incision. 6. Increased pain, redness, or drainage from the incision. 7. Difficulty swallowing or breathing 8. Muscle cramping or spasms. 9. Numbness or tingling in hands or feet or around lips.  The clinic staff is available to answer your questions during regular business hours.  Please dont hesitate to call and ask to speak to one of the nurses if you have concerns.  For further questions, please visit [email or web address]  Mechanical Wound Debridement Mechanical wound debridement is a treatment to remove dead tissue from a wound. This helps the wound heal. The treatment involves cleaning the wound (irrigation) and using a pad or gauze (dressing) to remove dead tissue and debris from the wound. There are different types of mechanical wound debridement. Depending on the wound, you may need to repeat this procedure or change to another form of debridement as your wound starts to heal. Tell a health care provider about:  Any allergies you have.  All medicines you are taking, including  vitamins, herbs, eye drops, creams, and over-the-counter medicines.  Any blood disorders you have.  Any medical conditions you have, including any conditions that: ? Cause a significant decrease in blood circulation to the part of the body where the wound is, such as peripheral vascular disease. ? Compromise your defense (immune) system or white blood count.  Any surgeries you have had.  Whether you are pregnant or may be pregnant. What are the risks? Generally, this is a safe procedure. However, problems may occur, including:  Infection.  Bleeding.  Damage to healthy tissue in and around your wound.  Soreness or pain.  Failure of the wound to heal.  Scarring.  What happens before the procedure? You may be given antibiotic medicine to help prevent infection. What happens during the procedure?  Your health care provider may apply a numbing medicine (topical anesthetic) to the wound.  Your health care provider will irrigate your wound with a germ-free (sterile), salt-water (saline) solution. This removes debris, bacteria, and dead tissue.  Depending on what type of mechanical wound debridement you are having, your health care provider may do one of the following: ?  Put a dressing on your wound. You may have dry gauze pad placed into the wound. Your health care provider will remove the gauze after the wound is dry. Any dead tissue and debris that has dried into the gauze will be lifted out of the wound (wet-to-dry debridement). ? Use a type of pad (monofilament fiber debridement pad). This pad has a fluffy surface on one side that picks up dead tissue and debris from your wound. Your health care provider wets the pad and wipes it over your wound for several minutes. ? Irrigate your wound with a pressurized stream of solution such as saline or water.  Once your health care provider is finished, he or she may apply a light dressing to your wound. The procedure may vary among health  care providers and hospitals. What happens after the procedure?  You may receive medicine for pain.  You will continue to receive antibiotic medicine if it was started before your procedure. This information is not intended to replace advice given to you by your health care provider. Make sure you discuss any questions you have with your health care provider. Document Released: 09/08/2014 Document Revised: 05/26/2015 Document Reviewed: 04/28/2014 Elsevier Interactive Patient Education  [employer].

## 2017-08-01 NOTE — Progress Notes (Signed)
Physical Therapy Treatment Patient Details Name: Gerald Farrell MRN: [id number] DOB: 1945-12-14 Today's Date: 08/01/2017    History of Present Illness Pt is a male 72 y.o. male admitted with SBO and hematemesis.   He underwent open lysis of adhesions, Small bowel resection, completion of chelcystectomy on 7/18. He was found to have lung abcess on CT and underwent brochoscopy.    PMH includes:  ETOH abuse, basal cell carcinoma, HTN, acute cholecystitis, s/p lumbar laminectomy,  s/p TKA     PT Comments    Pt received in bed asleep, agreeable to treatment. HE reports already walking the unit 1x this date with mobility team. Pt performs 6 steps with one handrail left, ascending forward and descending backward, mild effort noted. Pt also performs 5xSTS  Hands free in 13.23sec with fair amount of instability  But able to self correct without external support. Pt progressing well. Appropriate for return to home at DC from PT standpoint.      Follow Up Recommendations  No PT follow up;Supervision for mobility/OOB     Equipment Recommendations  None recommended by PT    Recommendations for Other Services       Precautions / Restrictions Precautions Precautions: Fall;Other (comment) Restrictions Weight Bearing Restrictions: No    Mobility  Bed Mobility Overal bed mobility: Modified Independent                Transfers Overall transfer level: Needs assistance Equipment used: Rolling walker (2 wheeled);None Transfers: Sit to/from Stand Sit to Stand: Supervision         General transfer comment: instability noted; 5xSTS: 13.23sec hands free.   Ambulation/Gait Ambulation/Gait assistance: Supervision Gait Distance (Feet): 585 Feet Assistive device: Rolling walker (2 wheeled) Gait Pattern/deviations: Trunk flexed;Step-through pattern;Decreased stride length Gait velocity: 0.34m/s during 2nd half or AMB  Gait velocity interpretation: 1.31 - 2.62 ft/sec, indicative of limited  community ambulator     Stairs     Pt performs 6 steps with one handrail left, ascending forward and descending backward, mild effort noted.          Wheelchair Mobility    Modified Rankin (Stroke Patients Only)       Balance Overall balance assessment: Mild deficits observed, not formally tested                                          Cognition Arousal/Alertness: Awake/alert Behavior During Therapy: WFL for tasks assessed/performed Overall Cognitive Status: Within Functional Limits for tasks assessed                                        Exercises      General Comments        Pertinent Vitals/Pain Pain Assessment: ("not too bad adn I just had a pain pill")    Home Living                      Prior Function            PT Goals (current goals can now be found in the care plan section) Acute Rehab PT Goals Patient Stated Goal: to get back to yoga  PT Goal Formulation: With patient Time For Goal Achievement: 08/13/17 Potential to Achieve Goals: Fair Progress towards PT goals: Progressing toward goals  Frequency    Min 3X/week      PT Plan Current plan remains appropriate    Co-evaluation              AM-PAC PT "6 Clicks" Daily Activity  Outcome Measure  Difficulty turning over in bed (including adjusting bedclothes, sheets and blankets)?: None Difficulty moving from lying on back to sitting on the side of the bed? : None Difficulty sitting down on and standing up from a chair with arms (e.g., wheelchair, bedside commode, etc,.)?: A Little Help needed moving to and from a bed to chair (including a wheelchair)?: A Little Help needed walking in hospital room?: A Little Help needed climbing 3-5 steps with a railing? : A Little 6 Click Score: 20    End of Session   Activity Tolerance: Patient tolerated treatment well;Patient limited by fatigue Patient left: in bed;with call bell/phone within  reach   PT Visit Diagnosis: Other abnormalities of gait and mobility (R26.89);Muscle weakness (generalized) (M62.81)     Time: 0923-3007 PT Time Calculation (min) (ACUTE ONLY): 13 min  Charges:  $Therapeutic Activity: 8-22 mins                     3:00 PM, 08/01/17 Etta Grandchild, PT, DPT Physical Therapist - [employer] [phone] (Pager)  [phone] (Office)      Dalesha Stanback C 08/01/2017, 2:57 PM

## 2017-08-01 NOTE — Discharge Summary (Signed)
Physician Discharge Summary  Patient ID: Gerald Farrell MRN: [id number] DOB/AGE: [birth year] 72 y.o.  Admit date: 07/14/2017 Discharge date: 08/02/2017   Admission Diagnoses:  Small bowel obstruction Hypokalemia Anemia of unclear etiology Hypertension Tobacco use Chronic pain Status post laparoscopic partial cholecystectomy April 29, 2017 by Dr Rosendo Gros complicated by bile leak managed with biliary stent which has been removed   Discharge Diagnoses:  Partial small bowel obstruction secondary to internal hernia  HTN Acute on chronic anemia  - transfused  Tobacco use/COPD Chronic pain -on Robaxin, gabapentin, oxycodone at home -treatment from the [place]. RUL PNA possible mass -  Severe malnutrition - Hypokalemia Hypomagnesemia Severe deconditioning Postop ileus    Active Problems:   Small bowel obstruction (HCC)   Cough   Abnormal CT of the chest   Status post surgery   Protein-calorie malnutrition, severe   PROCEDURES: s/pLaparoscopic lysis of adhesions,Conversion to exploratory laparotomy with small bowel resection and primary repair of small bowel serosal tear,Completion of prior partial cholecystectomy.7/18 Dr. Derinda Sis Course: Pt readmitted on 07/14/17. [name] with history of hypertension, tobacco use and recent melanoma removal status post laparoscopic partial cholecystectomy April 29, 2017 for gangrenous cholecystitis was transferred from [facility] for abdominal pain, small bowel obstruction and hematemesis.  He underwent partial cholecystectomy in late April of this year for gangrenous cholecystitis.  Due to severe inflammation the neck of the gallbladder was not removed during surgery.  He had a Endoloop placed around the infundibulum and everything above that was removed.  A surgical drain was placed.  As expected he developed a postoperative bile leak.  He underwent ERCP, sphincterotomy and biliary stent  placement on April 30 by Dr. Arelia Longest.  He was discharged on May 2.  He states that he recovered well.  He underwent stent removal on June 26 by Dr. Arelia Longest.  There was some mention of part of the stent breaking off during retrieval however cholangiogram during this procedure revealed no evidence of bile leak or extravasation of contrast. The patient states that ever since the stent was removed he has had intermittent episodes of mid abdominal pain several times a week.  Intermittent in nature.  Lasting for several hours.  But generally goes away.  No particular attributing factors.  On Saturday he had another episode of discomfort but the pain got so severe he could not tolerate it he also had hematemesis.  So he went to the emergency room.  At outside hospital he was found to be hypokalemic.  He had a leukocytosis.  He had a CT scan which demonstrated small bowel obstruction with a question of an internal hernia through mesenteric defect.  There was evidence of retained gallbladder on imaging as well (as expected).  He was transferred here since his surgical care was done here He reports that he has been having bowel movements but denies melena or hematochezia. He continues to smoke at readmission.  Patient was admitted and started on the small bowel protocol. GI consulted for possible continued biloma, but felt that sack-like structure on imaging was more likely retained gallbladder. Patient was advanced as tolerated per small bowel protocol, but ultimately failed conservative management. He underwent surgery 07/18/17 as listed above. Pulmonary consulted for hemoptysis and did bronchoscopy 7/18. RUL opacity felt to be a pneumonia and patient treated with antibiotics, hemoptysis resolved. Patient developed expected post-operative ileus, which slowly improved. Patient worked with therapies who did not feel he would need continued therapy  at home. Patient instructed on wound care and will be able to complete at  home. Of note patient required transfusion of 1 unit PRBC 7/23 and 7/29 for ABL anemia, he had an appropriate response in hemoglobin each time. On 08/02/17, patient felt stable for discharge home.    Physical Exam General appearance: alert, cooperative, no distress  Resp: clear to auscultation bilaterally Cardio: regular rate and rhythm, S1, S2 normal, no murmur, click, rub or gallop GI: soft, non-tender; bowel sounds normal; no masses,  no organomegaly and open site looks fine Extremities: extremities normal, atraumatic, no cyanosis or edema   Allergies as of 08/02/2017   No Known Allergies     Medication List    STOP taking these medications   hydrochlorothiazide 25 MG tablet Commonly known as:  HYDRODIURIL   ibuprofen 200 MG tablet Commonly known as:  ADVIL,MOTRIN   ketoconazole 2 % cream Commonly known as:  NIZORAL     TAKE these medications   acetaminophen 500 MG tablet Commonly known as:  TYLENOL He can take 2 tablets every 8 hours as needed for pain.  This should be your first choice for pain medications.  Do not exceed more than 4000 mg of Tylenol per day.  This can harm your liver.  You can buy this at any drugstore over-the-counter. What changed:    medication strength  how much to take  how to take this  when to take this  reasons to take this  additional instructions   amLODipine 10 MG tablet Commonly known as:  NORVASC Take 5 mg by mouth daily.   amoxicillin-clavulanate 875-125 MG tablet Commonly known as:  AUGMENTIN Take 1 tablet by mouth every 12 (twelve) hours.   carvedilol 25 MG tablet Commonly known as:  COREG Do not restart this medicine until you have talked with your primary care and they have recommended you resume it.  Your blood pressure has been too low while you were here to be on this. What changed:    how much to take  how to take this  when to take this  additional instructions   cloNIDine 0.1 MG tablet Commonly known as:   CATAPRES Take 0.1 mg by mouth 3 (three) times daily.   ENSURE Take 0.5 Cans by mouth 3 (three) times daily between meals.   gabapentin 400 MG capsule Commonly known as:  NEURONTIN Take 800 mg by mouth 3 (three) times daily.   lisinopril 40 MG tablet Commonly known as:  PRINIVIL,ZESTRIL You are currently taking this as 1 tablet daily;   You were listed as taking this twice a day before admission. What changed:    how much to take  how to take this  when to take this  additional instructions   LORazepam 0.5 MG tablet Commonly known as:  ATIVAN You can take 1 every 8 hours as needed for anxiety.  We will not refill this you will need to call the [facility] for further refills.   meloxicam 15 MG tablet Commonly known as:  MOBIC Take 15 mg by mouth daily.   methocarbamol 750 MG tablet Commonly known as:  ROBAXIN Take 750 mg by mouth 3 (three) times daily as needed for muscle spasms.   multivitamins with iron Tabs tablet He can buy a One-A-Day multiple vitamin with iron and take 1 daily.  Follow package directions.  You can buy this over-the-counter at any drugstore.   nicotine 14 mg/24hr patch Commonly known as:  NICODERM CQ - dosed  in mg/24 hours If you need this to stay off your cigarettes you can buy this over-the-counter at any drugstore and follow the package directions.   omeprazole 20 MG capsule Commonly known as:  PRILOSEC Take 40 mg by mouth daily.   oxycodone 5 MG capsule Commonly known as:  OXY-IR You can continue this as before 1 tablet every 8 hours as needed for pain.  For refills you will need to get this from the [place] as before. What changed:    how much to take  how to take this  when to take this  reasons to take this  additional instructions   oxyCODONE 5 MG immediate release tablet Commonly known as:  Oxy IR/ROXICODONE Take 1 tablet (5 mg total) by mouth every 8 (eight) hours as needed for moderate pain (chronic pain). What changed:  You were  already taking a medication with the same name, and this prescription was added. Make sure you understand how and when to take each.   polyethylene glycol packet Commonly known as:  MIRALAX / GLYCOLAX If you have issues with constipation you can use this as the package directions instructed.  You can buy this over-the-counter at any drugstore.   Potassium Chloride ER 20 MEQ Tbcr Take 20 mEq by mouth daily.   tamsulosin 0.4 MG Caps capsule Commonly known as:  FLOMAX Take 0.4 mg by mouth daily.   traMADol 50 MG tablet Commonly known as:  ULTRAM You can take 1 tablet every 6 hours as needed for pain not relieved by Tylenol, ibuprofen, Robaxin, and your daily oxycodone.      Follow-up Information    Greer Pickerel, MD Follow up on 08/21/2017.   Specialty:  General Surgery Why:  your appointment is at 9:45 AM.  Be at the office 30 minutes early for check in. Bring photo ID and insurance information. Contact information: [place] [address] [phone]        Call [facility] for follow-up in 1 week. Follow up.   Why:  [facility] and see if [facility] can set up an appointment next week.  They need to know about your hospitalization and review all your medications, and adjust them as they think necessary.          Signed: Brigid Re , [facility] 08/02/2017, 11:53 AM Pager: [phone] Consults: [phone] Mon-Fri 7:00 am-4:30 pm Sat-Sun 7:00 am-11:30 am

## 2017-08-02 MED ORDER — OXYCODONE HCL 5 MG PO TABS: 5.0000 mg | ORAL_TABLET | Freq: Three times a day (TID) | ORAL | 0 refills | Status: DC | PRN

## 2017-08-02 NOTE — Progress Notes (Signed)
Discharge home. Home discharge instruction given, no question verbalized. 

## 2017-08-13 ENCOUNTER — Encounter: Payer: Self-pay | Admitting: Nurse Practitioner

## 2017-08-13 ENCOUNTER — Other Ambulatory Visit: Payer: Self-pay | Admitting: Nurse Practitioner

## 2017-08-13 ENCOUNTER — Ambulatory Visit (INDEPENDENT_AMBULATORY_CARE_PROVIDER_SITE_OTHER)
Admission: RE | Admit: 2017-08-13 | Discharge: 2017-08-13 | Disposition: A | Discharge status: Home / Self Care | Payer: Non-veteran care | Source: Ambulatory Visit | Attending: Nurse Practitioner | Admitting: Nurse Practitioner

## 2017-08-13 ENCOUNTER — Other Ambulatory Visit: Payer: Self-pay | Admitting: General Surgery

## 2017-08-13 ENCOUNTER — Ambulatory Visit (INDEPENDENT_AMBULATORY_CARE_PROVIDER_SITE_OTHER): Payer: Non-veteran care | Admitting: Nurse Practitioner

## 2017-08-13 VITALS — BP 144/72 | HR 73 | Ht 69.0 in | Wt 135.0 lb

## 2017-08-13 DIAGNOSIS — J189 Pneumonia, unspecified organism: Secondary | ICD-10-CM | POA: Insufficient documentation

## 2017-08-13 DIAGNOSIS — R9389 Abnormal findings on diagnostic imaging of other specified body structures: Secondary | ICD-10-CM

## 2017-08-13 DIAGNOSIS — J181 Lobar pneumonia, unspecified organism: Secondary | ICD-10-CM

## 2017-08-13 HISTORY — DX: Pneumonia, unspecified organism: J18.9

## 2017-08-13 NOTE — Progress Notes (Signed)
@Patient  ID: Gerald Farrell, male    DOB: January 16, 1945, 72 y.o.   MRN: [id number]  Chief Complaint  Patient presents with  . Follow-up    Hospital Follow up - Chest Xray    Referring provider: No ref. provider found  72 Year old male followed by Dr. Lake Bells. Recently diagnosed with RUL Pneumonia during hospital admission. Health history includes HTN, Small bowel obstruction, and basal cell carcinoma.  HPI  Patient presents today for a hospital follow up. He was diagnosed with RUL pneumonia after bronch on 07-18-17 and given IV antibiotics in hospital and transitioned over to 14 day course of PO Augmentin at discharge. He was originally admitted for small bowel obstruction and underwent surgery for this condition. Denies any fever. States that he does still have shortness of breath. States that overall he is feeling much improved. He has quit smoking since hospital discharge. Has been using nicotine patches. Patient is requesting something for sleep. He states that he gets anxious at night and can't sleep.   Recent [place] [facility] Encounters:   [place] summary: Hospital admission 07-14-17 - 08-02-17  Discharge Diagnoses:  Partial small bowel obstruction secondary to internal hernia  Hospital admission 07-14-17 - 08-02-17 HTN Acute on chronic anemia  - transfused  Tobacco use/COPD Chronic pain -on Robaxin, gabapentin, oxycodone at home -treatment from the [place]. RUL PNA possible mass -  Severe malnutrition - Hypokalemia Hypomagnesemia Severe deconditioning Postop ileus  Patient was admitted and started on the small bowel protocol. GI consulted for possible continued biloma, but felt that sack-like structure on imaging was more likely retained gallbladder. Patient was advanced as tolerated per small bowel protocol, but ultimately failed conservative management. He underwent surgery 07/18/17 as listed above. Pulmonary consulted for hemoptysis and did bronchoscopy 7/18. RUL opacity felt to  be a pneumonia and patient treated with antibiotics, hemoptysis resolved. Patient developed expected post-operative ileus, which slowly improved. Patient worked with therapies who did not feel he would need continued therapy at home. Patient instructed on wound care and will be able to complete at home. Of note patient required transfusion of 1 unit PRBC 7/23 and 7/29 for ABL anemia, he had an appropriate response in hemoglobin each time. On 08/02/17, patient felt stable for discharge home.    No Known Allergies  There is no immunization history for the selected administration types on file for this patient.  Past Medical History:  Diagnosis Date  . Acute cholecystitis 04/27/2017  . Alcohol abuse   . Basal cell carcinoma   . Hypertension     Tobacco History: Social History   Tobacco Use  Smoking Status Current Every Day Smoker  . Packs/day: 0.30  . Types: Cigarettes  Smokeless Tobacco Never Used   Ready to quit: former smoker - just quit since hospital discharge Counseling given: Not Answered   Outpatient Encounter Medications as of 08/13/2017  Medication Sig  . acetaminophen (TYLENOL) 500 MG tablet He can take 2 tablets every 8 hours as needed for pain.  This should be your first choice for pain medications.  Do not exceed more than 4000 mg of Tylenol per day.  This can harm your liver.  You can buy this at any drugstore over-the-counter.  Marland Kitchen amLODipine (NORVASC) 10 MG tablet Take 5 mg by mouth daily.  . carvedilol (COREG) 25 MG tablet Do not restart this medicine until you have talked with your primary care and they have recommended you resume it.  Your blood pressure has been too low  while you were here to be on this.  . cloNIDine (CATAPRES) 0.1 MG tablet Take 0.1 mg by mouth 3 (three) times daily.  Marland Kitchen ENSURE (ENSURE) Take 0.5 Cans by mouth 3 (three) times daily between meals.  . gabapentin (NEURONTIN) 400 MG capsule Take 800 mg by mouth 3 (three) times daily.  Marland Kitchen lisinopril  (PRINIVIL,ZESTRIL) 40 MG tablet You are currently taking this as 1 tablet daily;   You were listed as taking this twice a day before admission.  . meloxicam (MOBIC) 15 MG tablet Take 15 mg by mouth daily.  . methocarbamol (ROBAXIN) 750 MG tablet Take 750 mg by mouth 3 (three) times daily as needed for muscle spasms.   . Multiple Vitamins-Iron (MULTIVITAMINS WITH IRON) TABS tablet He can buy a One-A-Day multiple vitamin with iron and take 1 daily.  Follow package directions.  You can buy this over-the-counter at any drugstore.  . nicotine (NICODERM CQ - DOSED IN MG/24 HOURS) 14 mg/24hr patch If you need this to stay off your cigarettes you can buy this over-the-counter at any drugstore and follow the package directions.  Marland Kitchen omeprazole (PRILOSEC) 20 MG capsule Take 40 mg by mouth daily.   . polyethylene glycol (MIRALAX / GLYCOLAX) packet If you have issues with constipation you can use this as the package directions instructed.  You can buy this over-the-counter at any drugstore.  . Potassium Chloride ER 20 MEQ TBCR Take 20 mEq by mouth daily.  . tamsulosin (FLOMAX) 0.4 MG CAPS capsule Take 0.4 mg by mouth daily.  . traMADol (ULTRAM) 50 MG tablet You can take 1 tablet every 6 hours as needed for pain not relieved by Tylenol, ibuprofen, Robaxin, and your daily oxycodone.  . [DISCONTINUED] amoxicillin-clavulanate (AUGMENTIN) 875-125 MG tablet Take 1 tablet by mouth every 12 (twelve) hours. (Patient not taking: Reported on 08/13/2017)  . [DISCONTINUED] LORazepam (ATIVAN) 0.5 MG tablet You can take 1 every 8 hours as needed for anxiety.  We will not refill this you will need to call the [facility] for further refills. (Patient not taking: Reported on 08/13/2017)  . [DISCONTINUED] oxyCODONE (OXY IR/ROXICODONE) 5 MG immediate release tablet Take 1 tablet (5 mg total) by mouth every 8 (eight) hours as needed for moderate pain (chronic pain). (Patient not taking: Reported on 08/13/2017)  . [DISCONTINUED] oxycodone (OXY-IR)  5 MG capsule You can continue this as before 1 tablet every 8 hours as needed for pain.  For refills you will need to get this from the [place] as before. (Patient not taking: Reported on 08/13/2017)   No facility-administered encounter medications on file as of 08/13/2017.      Review of Systems  Review of Systems  Constitutional: Negative.   HENT: Negative.   Respiratory: Positive for shortness of breath. Negative for cough.   Cardiovascular: Negative.   Gastrointestinal: Negative.   Allergic/Immunologic: Negative.   Neurological: Negative.   Psychiatric/Behavioral: Negative.        Physical Exam  BP (!) 144/72 (BP Location: Right Arm, Patient Position: Sitting, Cuff Size: Normal)   Pulse 73   Ht 5\' 9"  (1.753 m)   Wt 135 lb (61.2 kg)   SpO2 98%   BMI 19.94 kg/m   Wt Readings from Last 5 Encounters:  08/13/17 135 lb (61.2 kg)  07/29/17 138 lb 12.8 oz (63 kg)  06/26/17 131 lb (59.4 kg)  06/19/17 131 lb (59.4 kg)  04/30/17 135 lb (61.2 kg)     Physical Exam  Constitutional: He is oriented to  person, place, and time. He appears well-developed and well-nourished. No distress.  Cardiovascular: Normal rate and regular rhythm.  Pulmonary/Chest: Effort normal and breath sounds normal.  Neurological: He is alert and oriented to person, place, and time.  Skin: Skin is warm and dry.  Psychiatric: He has a normal mood and affect.  Nursing note and vitals reviewed.    Lab Results:  CBC    Component Value Date/Time   WBC 9.8 07/30/2017 0429   RBC 2.91 (L) 07/30/2017 0429   HGB 8.6 (L) 07/30/2017 0429   HCT 26.8 (L) 07/30/2017 0429   PLT 273 07/30/2017 0429   MCV 92.1 07/30/2017 0429   MCH 29.6 07/30/2017 0429   MCHC 32.1 07/30/2017 0429   RDW 14.8 07/30/2017 0429   LYMPHSABS 0.7 07/29/2017 0305   MONOABS 1.3 (H) 07/29/2017 0305   EOSABS 0.1 07/29/2017 0305   BASOSABS 0.0 07/29/2017 0305    BMET    Component Value Date/Time   NA 136 07/30/2017 0429   K 3.5  07/30/2017 0429   CL 106 07/30/2017 0429   CO2 26 07/30/2017 0429   GLUCOSE 85 07/30/2017 0429   BUN 6 (L) 07/30/2017 0429   CREATININE 0.42 (L) 07/30/2017 0429   CALCIUM 8.0 (L) 07/30/2017 0429   GFRNONAA >60 07/30/2017 0429   GFRAA >60 07/30/2017 0429    BNP No results found for: BNP  ProBNP No results found for: PROBNP  Imaging: Dg Chest 2 View  Result Date: 08/13/2017 CLINICAL DATA:  Right upper lobe pneumonia.  Follow-up. EXAM: CHEST - 2 VIEW COMPARISON:  07/29/2017 FINDINGS: Right perihilar interstitial airspace disease and mild right basilar airspace disease which may reflect atelectasis versus scarring. There is no pleural effusion or pneumothorax. There is elevation of the right diaphragm. The heart mediastinum are stable. No acute osseous abnormality. IMPRESSION: Right perihilar interstitial airspace disease and mild right basilar airspace disease which may reflect atelectasis versus scarring. Electronically Signed   By: Kathreen Devoid   On: 08/13/2017 12:29   Dg Chest 2 View  Result Date: 07/16/2017 CLINICAL DATA:  One-week history of fever and productive cough. Acute onset of shortness of breath this morning. Current smoker. EXAM: CHEST - 2 VIEW COMPARISON:  10/10/2015. FINDINGS: Cardiac silhouette normal in size, unchanged. Thoracic aorta mildly atherosclerotic, unchanged. Mass or masslike consolidation involving the RIGHT hilum extending into the RIGHT UPPER LOBE. Linear atelectasis in the adjacent RIGHT UPPER LOBE and in the RIGHT MIDDLE LOBE. LEFT lung clear. Small RIGHT pleural effusion. Degenerative changes involving the thoracic spine. Gaseous distention of numerous loops of small bowel in the visualized upper abdomen, demonstrating air-fluid levels. IMPRESSION: 1. Mass or masslike consolidation involving the RIGHT hilum extending into the RIGHT UPPER LOBE. 2. Linear atelectasis in the adjacent RIGHT UPPER LOBE and in the RIGHT MIDDLE LOBE. 3. Small RIGHT pleural effusion. 4.  Partial small bowel obstruction as noted on recent prior abdominal imaging. CT of the chest with contrast may be helpful in further evaluation to confirm or deny the presence of a RIGHT hilar mass. These results will be called to the ordering clinician or representative by the Radiologist Assistant, and communication documented in the PACS or zVision Dashboard. Electronically Signed   By: Evangeline Dakin M.D.   On: 07/16/2017 16:02   Ct Chest W Contrast  Addendum Date: 07/17/2017   ADDENDUM REPORT: 07/17/2017 16:18 ADDENDUM: Results were further discussed by telephone with Dr. Greer Pickerel. The enteric contrast was administered on 07/14/2017 as part of the small  bowel obstruction protocol. As noted, this contrast is now in the colon. The appendix is visualized on this CT and appears normal (best seen on coronal images 77 through 87). Current findings still support a progressive partial distal small bowel obstruction. Electronically Signed   By: Richardean Sale M.D.   On: 07/17/2017 16:18   Result Date: 07/17/2017 CLINICAL DATA:  Right hilar mass on radiographs. Bowel obstruction. History of basal cell carcinoma and hypertension. EXAM: CT CHEST, ABDOMEN, AND PELVIS WITH CONTRAST TECHNIQUE: Multidetector CT imaging of the chest, abdomen and pelvis was performed following the standard protocol during bolus administration of intravenous contrast. CONTRAST:  14mL OMNIPAQUE IOHEXOL 300 MG/ML  SOLN COMPARISON:  Chest radiographs 07/16/2017. Abdominopelvic CT 07/14/2017 and 04/27/2017. FINDINGS: CT CHEST FINDINGS Cardiovascular: There is atherosclerosis of the aorta, great vessels and coronary arteries. No acute vascular findings are demonstrated. The heart size is normal. There is no pericardial effusion. Mediastinum/Nodes: There are no enlarged axillary or mediastinal lymph nodes. Right hilar soft tissue measuring 2.7 x 1.1 cm on image 32/3 may reflect adenopathy, likely reactive in etiology. Small right thyroid  nodule is of doubtful significance. The trachea and esophagus appear normal. Lungs/Pleura: Trace bilateral pleural effusions. There is focal consolidation in the anterior segment of the right upper lobe which was not present on the abdominal CT done 04/27/2017. This measures up to 6.9 x 5.0 cm on image 32/3. There is associated central low-density and small air bubbles. On the reformatted images, there is mild inferior bowing of the minor fissure. Surrounding this area of consolidation, there is patchy airspace disease elsewhere in the right upper lobe. There is linear atelectasis in both lower lobes. Mild underlying emphysema noted. Musculoskeletal/Chest wall: No chest wall mass or suspicious osseous findings. CT ABDOMEN AND PELVIS FINDINGS Hepatobiliary: The liver is normal in density without focal abnormality. No evidence of gallstones, gallbladder wall thickening or biliary dilatation. Pancreas: Unremarkable. No pancreatic ductal dilatation or surrounding inflammatory changes. Spleen: Normal in size without focal abnormality. Adrenals/Urinary Tract: Both adrenal glands appear normal. There are stable small renal cysts bilaterally. There is cortical scarring in the lower pole of the left kidney. No evidence of urinary tract calculus or hydronephrosis. The bladder appears normal. Stomach/Bowel: There is a small amount of enteric contrast which is present in the proximal colon. The stomach and small bowel are fluid-filled and mildly dilated diffusely. This has progressed from the recent CT. The extreme terminal ileum appears decompressed, best seen on the coronal images. The cecum is located in the right mid abdomen. There is no significant colonic distention. There is mild wall thickening of the sigmoid colon without focal surrounding inflammation. Vascular/Lymphatic: There are no enlarged abdominal or pelvic lymph nodes. There is aortic and branch vessel atherosclerosis. No acute vascular findings. Reproductive:  The prostate gland and seminal vesicles appear normal. Other: There is progressive ascites with edema throughout the mesenteric and subcutaneous fat. No focal extraluminal fluid or air collections are seen. There is no extravasated enteric contrast. Musculoskeletal: No acute or significant osseous findings. There is extensive lumbar spondylosis associated with a convex right scoliosis. IMPRESSION: 1. Right upper lobe bacterial pneumonia with possible early lung abscesses. Given the associated bowing of the minor fissure, consider Klebsiella pneumonia. Radiographic follow-up recommended to document clearing and exclude underlying malignancy. Probable associated reactive right hilar adenopathy. 2. Progressive distal small bowel obstruction. Specific etiology is not delineated, although based on previous study and location of the cecum, this could be secondary to an internal hernia  or adhesions. 3. Progressive ascites and generalized soft tissue edema. No focal extraluminal fluid collection. 4. Mild sigmoid colon wall thickening, nonspecific. 5. Aortic Atherosclerosis (ICD10-I70.0). No acute vascular findings. Electronically Signed: By: Richardean Sale M.D. On: 07/17/2017 14:53   Ct Abdomen Pelvis W Contrast  Addendum Date: 07/17/2017   ADDENDUM REPORT: 07/17/2017 16:18 ADDENDUM: Results were further discussed by telephone with Dr. Greer Pickerel. The enteric contrast was administered on 07/14/2017 as part of the small bowel obstruction protocol. As noted, this contrast is now in the colon. The appendix is visualized on this CT and appears normal (best seen on coronal images 77 through 87). Current findings still support a progressive partial distal small bowel obstruction. Electronically Signed   By: Richardean Sale M.D.   On: 07/17/2017 16:18   Result Date: 07/17/2017 CLINICAL DATA:  Right hilar mass on radiographs. Bowel obstruction. History of basal cell carcinoma and hypertension. EXAM: CT CHEST, ABDOMEN, AND  PELVIS WITH CONTRAST TECHNIQUE: Multidetector CT imaging of the chest, abdomen and pelvis was performed following the standard protocol during bolus administration of intravenous contrast. CONTRAST:  190mL OMNIPAQUE IOHEXOL 300 MG/ML  SOLN COMPARISON:  Chest radiographs 07/16/2017. Abdominopelvic CT 07/14/2017 and 04/27/2017. FINDINGS: CT CHEST FINDINGS Cardiovascular: There is atherosclerosis of the aorta, great vessels and coronary arteries. No acute vascular findings are demonstrated. The heart size is normal. There is no pericardial effusion. Mediastinum/Nodes: There are no enlarged axillary or mediastinal lymph nodes. Right hilar soft tissue measuring 2.7 x 1.1 cm on image 32/3 may reflect adenopathy, likely reactive in etiology. Small right thyroid nodule is of doubtful significance. The trachea and esophagus appear normal. Lungs/Pleura: Trace bilateral pleural effusions. There is focal consolidation in the anterior segment of the right upper lobe which was not present on the abdominal CT done 04/27/2017. This measures up to 6.9 x 5.0 cm on image 32/3. There is associated central low-density and small air bubbles. On the reformatted images, there is mild inferior bowing of the minor fissure. Surrounding this area of consolidation, there is patchy airspace disease elsewhere in the right upper lobe. There is linear atelectasis in both lower lobes. Mild underlying emphysema noted. Musculoskeletal/Chest wall: No chest wall mass or suspicious osseous findings. CT ABDOMEN AND PELVIS FINDINGS Hepatobiliary: The liver is normal in density without focal abnormality. No evidence of gallstones, gallbladder wall thickening or biliary dilatation. Pancreas: Unremarkable. No pancreatic ductal dilatation or surrounding inflammatory changes. Spleen: Normal in size without focal abnormality. Adrenals/Urinary Tract: Both adrenal glands appear normal. There are stable small renal cysts bilaterally. There is cortical scarring in  the lower pole of the left kidney. No evidence of urinary tract calculus or hydronephrosis. The bladder appears normal. Stomach/Bowel: There is a small amount of enteric contrast which is present in the proximal colon. The stomach and small bowel are fluid-filled and mildly dilated diffusely. This has progressed from the recent CT. The extreme terminal ileum appears decompressed, best seen on the coronal images. The cecum is located in the right mid abdomen. There is no significant colonic distention. There is mild wall thickening of the sigmoid colon without focal surrounding inflammation. Vascular/Lymphatic: There are no enlarged abdominal or pelvic lymph nodes. There is aortic and branch vessel atherosclerosis. No acute vascular findings. Reproductive: The prostate gland and seminal vesicles appear normal. Other: There is progressive ascites with edema throughout the mesenteric and subcutaneous fat. No focal extraluminal fluid or air collections are seen. There is no extravasated enteric contrast. Musculoskeletal: No acute or significant osseous  findings. There is extensive lumbar spondylosis associated with a convex right scoliosis. IMPRESSION: 1. Right upper lobe bacterial pneumonia with possible early lung abscesses. Given the associated bowing of the minor fissure, consider Klebsiella pneumonia. Radiographic follow-up recommended to document clearing and exclude underlying malignancy. Probable associated reactive right hilar adenopathy. 2. Progressive distal small bowel obstruction. Specific etiology is not delineated, although based on previous study and location of the cecum, this could be secondary to an internal hernia or adhesions. 3. Progressive ascites and generalized soft tissue edema. No focal extraluminal fluid collection. 4. Mild sigmoid colon wall thickening, nonspecific. 5. Aortic Atherosclerosis (ICD10-I70.0). No acute vascular findings. Electronically Signed: By: Richardean Sale M.D. On:  07/17/2017 14:53   Dg Chest Port 1 View  Result Date: 07/29/2017 CLINICAL DATA:  RIGHT upper lobe pneumonia EXAM: PORTABLE CHEST 1 VIEW COMPARISON:  Portable exam 1422 hours compared to 07/26/2017 FINDINGS: RIGHT arm PICC line with tip projecting over SVC. Normal heart size. Apparent enlargement of the RIGHT pulmonary hilum is demonstrated by a recent CT exam to likely represent superimposed opacity in the anterior RIGHT upper lobe. Significant volume loss at RIGHT base since previous exam with elevation of RIGHT diaphragm and RIGHT basilar atelectasis. Bronchitic changes centrally with minimal atelectasis at LEFT base. Remaining lungs clear. No pleural effusion or pneumothorax. Bones unremarkable. IMPRESSION: Significant increase in RIGHT basilar atelectasis and volume loss since previous exam with elevation of RIGHT diaphragm. Persistent RIGHT upper lobe opacity as seen on a recent CT chest; radiographic followup until resolution recommended to exclude underlying abnormalities including pulmonary mass. Electronically Signed   By: Lavonia Dana M.D.   On: 07/29/2017 14:42   Dg Chest Port 1 View  Result Date: 07/26/2017 CLINICAL DATA:  Right chest pain EXAM: PORTABLE CHEST 1 VIEW COMPARISON:  07/16/2017 FINDINGS: Right upper lobe perihilar infiltrate has improved. No right effusion. Small left effusion. Negative for heart failure. Right arm PICC tip at the cavoatrial junction. IMPRESSION: Interval improvement in right upper lobe infiltrate. Small left pleural effusion unchanged. Right arm PICC tip at the cavoatrial junction. Electronically Signed   By: Franchot Gallo M.D.   On: 07/26/2017 13:21   Dg Abd Portable 1v-small Bowel Obstruction Protocol-initial, 8 Hr Delay  Result Date: 07/14/2017 CLINICAL DATA:  Small-bowel obstruction 8 hour film. EXAM: PORTABLE ABDOMEN - 1 VIEW COMPARISON:  Earlier same day. FINDINGS: Nasogastric tube is no longer present. Contrast is present within the stomach, throughout  the small intestine, in probably reaching the right colon. Small bowel does appear mildly prominent. IMPRESSION: Contrast reaches at least the right colon. Slightly prominent small intestinal pattern. Nasogastric tube no longer present. Electronically Signed   By: Nelson Chimes M.D.   On: 07/14/2017 21:49   [place] Ekg Site Rite  Result Date: 07/22/2017 If Site Rite image not attached, placement could not be confirmed due to current cardiac rhythm.    Assessment & Plan:   Pneumonia of right upper lobe due to infectious organism [facility]) Patient Instructions  You seem to be recovering well This will be a slow recovery process Please continue to use incentive spirometry Will follow up in 3 weeks May take melatonin for sleep May take mucinex OTC if needed Will follow up in 3 weeks Continue to work on smoking cessation       Fenton Foy, NP 08/13/2017

## 2017-08-13 NOTE — Patient Instructions (Addendum)
You seem to be recovering well This will be a slow recovery process Please continue to use incentive spirometry Will follow up in 4 weeks May take melatonin for sleep May take mucinex OTC if needed Will follow up in 3 weeks Continue to work on smoking cessation

## 2017-08-13 NOTE — Assessment & Plan Note (Addendum)
Patient Instructions  You seem to be recovering well This will be a slow recovery process Please continue to use incentive spirometry Will follow up in 3 weeks May take melatonin for sleep May take mucinex OTC if needed Will follow up in 3 weeks Continue to work on smoking cessation

## 2017-08-15 LAB — FUNGUS CULTURE RESULT

## 2017-08-15 LAB — FUNGUS CULTURE WITH STAIN

## 2017-08-15 LAB — FUNGAL ORGANISM REFLEX

## 2017-08-20 NOTE — Progress Notes (Signed)
Reviewed, agree 

## 2017-08-30 LAB — ACID FAST CULTURE WITH REFLEXED SENSITIVITIES: ACID FAST CULTURE - AFSCU3: NEGATIVE

## 2017-09-04 ENCOUNTER — Ambulatory Visit (INDEPENDENT_AMBULATORY_CARE_PROVIDER_SITE_OTHER): Payer: Non-veteran care | Admitting: Pulmonary Disease

## 2017-09-04 ENCOUNTER — Ambulatory Visit (INDEPENDENT_AMBULATORY_CARE_PROVIDER_SITE_OTHER)
Admission: RE | Admit: 2017-09-04 | Discharge: 2017-09-04 | Disposition: A | Discharge status: Home / Self Care | Payer: Non-veteran care | Source: Ambulatory Visit | Attending: Pulmonary Disease | Admitting: Pulmonary Disease

## 2017-09-04 ENCOUNTER — Other Ambulatory Visit (INDEPENDENT_AMBULATORY_CARE_PROVIDER_SITE_OTHER): Payer: Non-veteran care

## 2017-09-04 ENCOUNTER — Encounter: Payer: Self-pay | Admitting: Pulmonary Disease

## 2017-09-04 VITALS — BP 140/80 | HR 68 | Ht 69.0 in | Wt 130.0 lb

## 2017-09-04 DIAGNOSIS — R0600 Dyspnea, unspecified: Secondary | ICD-10-CM | POA: Diagnosis not present

## 2017-09-04 DIAGNOSIS — J181 Lobar pneumonia, unspecified organism: Secondary | ICD-10-CM | POA: Diagnosis not present

## 2017-09-04 DIAGNOSIS — J189 Pneumonia, unspecified organism: Secondary | ICD-10-CM

## 2017-09-04 LAB — BRAIN NATRIURETIC PEPTIDE: Pro B Natriuretic peptide (BNP): 55 pg/mL (ref 0.0–100.0)

## 2017-09-04 MED ORDER — ZOLPIDEM TARTRATE 10 MG PO TABS: 5.0000 mg | ORAL_TABLET | Freq: Every evening | ORAL | 0 refills | Status: DC | PRN

## 2017-09-04 MED ORDER — ZOLPIDEM TARTRATE 5 MG PO TABS: 5.0000 mg | ORAL_TABLET | Freq: Every day | ORAL | 0 refills | Status: DC

## 2017-09-04 NOTE — Progress Notes (Signed)
Synopsis: First seen in July 2019 for HCAP in the setting of acute cholecystitis and a small bowel obstruction.  He is a longtime smoker.  He had a bronchoscopy performed by Dr. Lamonte Sakai in July 2019 for the same.  Subjective:   PATIENT ID: Gerald Farrell GENDER: male DOB: 01/10/1945, MRN: [id number]   HPI  Chief Complaint  Patient presents with  . Follow-up    still having shortness of breath, mostly in evening after dinner, interefering with sleep    Gerald Farrell returns to our clinic today for evaluation of pneumonia which she experienced while hospitalized in July.  He is making slow improvement with his shortness of breath but still has some sensation of dyspnea while lying flat at night.  He says this will cause him to wake up gasping for air from time to time.  He has ankle swelling but this is not changed.  He continues to have a cough.  He says that when he was using an electronic cigarette he felt a little worse.  Is now only smoking 1 cigarette every other day or so.  Past Medical History:  Diagnosis Date  . Acute cholecystitis 04/27/2017  . Alcohol abuse   . Basal cell carcinoma   . Hypertension      Family History  Problem Relation Age of Onset  . Heart disease Mother   . Heart disease Father      Social History   Socioeconomic History  . Marital status: Divorced    Spouse name: Not on file  . Number of children: Not on file  . Years of education: Not on file  . Highest education level: Not on file  Occupational History  . Not on file  Social Needs  . Financial resource strain: Not on file  . Food insecurity:    Worry: Not on file    Inability: Not on file  . Transportation needs:    Medical: Not on file    Non-medical: Not on file  Tobacco Use  . Smoking status: Current Every Day Smoker    Packs/day: 0.30    Types: Cigarettes  . Smokeless tobacco: Never Used  . Tobacco comment: 2 cigarettes a week  Substance and Sexual Activity  . Alcohol use: Not  Currently  . Drug use: Not Currently  . Sexual activity: Not on file  Lifestyle  . Physical activity:    Days per week: Not on file    Minutes per session: Not on file  . Stress: Not on file  Relationships  . Social connections:    Talks on phone: Not on file    Gets together: Not on file    Attends religious service: Not on file    Active member of club or organization: Not on file    Attends meetings of clubs or organizations: Not on file    Relationship status: Not on file  . Intimate partner violence:    Fear of current or ex partner: Not on file    Emotionally abused: Not on file    Physically abused: Not on file    Forced sexual activity: Not on file  Other Topics Concern  . Not on file  Social History Narrative   Divorced   [place] - goes to [employer] [employer] [facility]   No alcohol now   Smoker   Not using drugs now     No Known Allergies   Outpatient Medications Prior to Visit  Medication Sig Dispense Refill  . acetaminophen (TYLENOL)  500 MG tablet He can take 2 tablets every 8 hours as needed for pain.  This should be your first choice for pain medications.  Do not exceed more than 4000 mg of Tylenol per day.  This can harm your liver.  You can buy this at any drugstore over-the-counter. 30 tablet 0  . carvedilol (COREG) 25 MG tablet Do not restart this medicine until you have talked with your primary care and they have recommended you resume it.  Your blood pressure has been too low while you were here to be on this.    . cloNIDine (CATAPRES) 0.1 MG tablet Take 0.1 mg by mouth 3 (three) times daily.    Marland Kitchen ENSURE (ENSURE) Take 0.5 Cans by mouth 3 (three) times daily between meals.    . gabapentin (NEURONTIN) 400 MG capsule Take 800 mg by mouth 3 (three) times daily.    Marland Kitchen lisinopril (PRINIVIL,ZESTRIL) 40 MG tablet You are currently taking this as 1 tablet daily;   You were listed as taking this twice a day before admission.    . meloxicam (MOBIC) 15 MG tablet Take 15 mg by mouth daily.      . methocarbamol (ROBAXIN) 750 MG tablet Take 750 mg by mouth 3 (three) times daily as needed for muscle spasms.     . nicotine (NICODERM CQ - DOSED IN MG/24 HOURS) 14 mg/24hr patch If you need this to stay off your cigarettes you can buy this over-the-counter at any drugstore and follow the package directions. 28 patch 0  . omeprazole (PRILOSEC) 20 MG capsule Take 40 mg by mouth daily.     . Potassium Chloride ER 20 MEQ TBCR Take 20 mEq by mouth daily. 90 tablet 1  . tamsulosin (FLOMAX) 0.4 MG CAPS capsule Take 0.4 mg by mouth daily.    Marland Kitchen amLODipine (NORVASC) 10 MG tablet Take 5 mg by mouth daily.    . Multiple Vitamins-Iron (MULTIVITAMINS WITH IRON) TABS tablet He can buy a One-A-Day multiple vitamin with iron and take 1 daily.  Follow package directions.  You can buy this over-the-counter at any drugstore. (Patient not taking: Reported on 09/04/2017)  0  . polyethylene glycol (MIRALAX / GLYCOLAX) packet If you have issues with constipation you can use this as the package directions instructed.  You can buy this over-the-counter at any drugstore. (Patient not taking: Reported on 09/04/2017) 14 each 0  . traMADol (ULTRAM) 50 MG tablet You can take 1 tablet every 6 hours as needed for pain not relieved by Tylenol, ibuprofen, Robaxin, and your daily oxycodone. (Patient not taking: Reported on 09/04/2017) 30 tablet 0   No facility-administered medications prior to visit.     ROS    Objective:  Physical Exam   Vitals:   09/04/17 1136  BP: 140/80  Pulse: 68  SpO2: 99%  Weight: 130 lb (59 kg)  Height: _0  (1.753 m)    Gen: chronically ill appearing HENT: OP clear, TM's clear, neck supple PULM: CTA B, normal percussion CV: RRR, no mgr, notable ankle edema GI: BS+, soft, nontender Derm: no cyanosis or rash Psyche: normal mood and affect   CBC    Component Value Date/Time   WBC 9.8 07/30/2017 0429   RBC 2.91 (L) 07/30/2017 0429   HGB 8.6 (L) 07/30/2017 0429   HCT 26.8 (L) 07/30/2017  0429   PLT 273 07/30/2017 0429   MCV 92.1 07/30/2017 0429   MCH 29.6 07/30/2017 0429   MCHC 32.1 07/30/2017 0429  RDW 14.8 07/30/2017 0429   LYMPHSABS 0.7 07/29/2017 0305   MONOABS 1.3 (H) 07/29/2017 0305   EOSABS 0.1 07/29/2017 0305   BASOSABS 0.0 07/29/2017 0305     Chest imaging: August 13, 2017 chest x-ray images independently reviewed showing what appears to be a right lower lobe effusion.  Improve right upper lobe airspace disease.  PFT:  Labs:  Path: September 18, 2017 cytology BAL negative, inflammatory changes noted.  Echo:  Heart Catheterization:  July 2019 BAL bacterial, fungal, AFB negative     Assessment & Plan:   Pneumonia of right upper lobe due to infectious organism [facility]) - Plan: DG Chest 2 View  Dyspnea, unspecified type - Plan: B Nat Peptide  Discussion: Vishaal has been making slow improvement over the last few weeks though he still has some shortness of breath which is not unexpected considering the fact that he had a severe case of pneumonia.  I am puzzled by the fact that his chest x-ray and August seem to have evidence of either right hemidiaphragm elevation or a pleural effusion in the right base.  I would like to investigate this further with a repeat chest x-ray.  Today on physical exam he did not seem to have evidence of a pleural effusion.  He did seem somewhat volume overloaded with leg swelling, he thinks this may be due to amlodipine.  I like to check a BNP to make sure there is no evidence of heart failure considering his orthopnea.  Plan: Cigarette smoker: Stop smoking altogether  Healthcare associated pneumonia: We will repeat a chest x-ray today to make sure things are clearing up If there is evidence of an underlying fluid collection I may need to order a CT scan of your chest   Shortness of breath with leg swelling: We will check a test today called a proBNP to make sure there is no evidence of heart failure  Mild insomnia: Use  Ambien at night as needed to help with sleep, 15 pills prescribed today, 0 refills Follow up with the [facility] [place] regarding this  Follow up here with an NP in 4 weeks   Current Outpatient Medications:  .  acetaminophen (TYLENOL) 500 MG tablet, He can take 2 tablets every 8 hours as needed for pain.  This should be your first choice for pain medications.  Do not exceed more than 4000 mg of Tylenol per day.  This can harm your liver.  You can buy this at any drugstore over-the-counter., Disp: 30 tablet, Rfl: 0 .  carvedilol (COREG) 25 MG tablet, Do not restart this medicine until you have talked with your primary care and they have recommended you resume it.  Your blood pressure has been too low while you were here to be on this., Disp: , Rfl:  .  cloNIDine (CATAPRES) 0.1 MG tablet, Take 0.1 mg by mouth 3 (three) times daily., Disp: , Rfl:  .  ENSURE (ENSURE), Take 0.5 Cans by mouth 3 (three) times daily between meals., Disp: , Rfl:  .  gabapentin (NEURONTIN) 400 MG capsule, Take 800 mg by mouth 3 (three) times daily., Disp: , Rfl:  .  lisinopril (PRINIVIL,ZESTRIL) 40 MG tablet, You are currently taking this as 1 tablet daily;   You were listed as taking this twice a day before admission., Disp: , Rfl:  .  meloxicam (MOBIC) 15 MG tablet, Take 15 mg by mouth daily., Disp: , Rfl:  .  methocarbamol (ROBAXIN) 750 MG tablet, Take 750 mg by mouth 3 (  three) times daily as needed for muscle spasms. , Disp: , Rfl:  .  nicotine (NICODERM CQ - DOSED IN MG/24 HOURS) 14 mg/24hr patch, If you need this to stay off your cigarettes you can buy this over-the-counter at any drugstore and follow the package directions., Disp: 28 patch, Rfl: 0 .  omeprazole (PRILOSEC) 20 MG capsule, Take 40 mg by mouth daily. , Disp: , Rfl:  .  Potassium Chloride ER 20 MEQ TBCR, Take 20 mEq by mouth daily., Disp: 90 tablet, Rfl: 1 .  tamsulosin (FLOMAX) 0.4 MG CAPS capsule, Take 0.4 mg by mouth daily., Disp: , Rfl:  .  amLODipine (NORVASC) 10  MG tablet, Take 5 mg by mouth daily., Disp: , Rfl:  .  Multiple Vitamins-Iron (MULTIVITAMINS WITH IRON) TABS tablet, He can buy a One-A-Day multiple vitamin with iron and take 1 daily.  Follow package directions.  You can buy this over-the-counter at any drugstore. (Patient not taking: Reported on 09/04/2017), Disp: , Rfl: 0 .  polyethylene glycol (MIRALAX / GLYCOLAX) packet, If you have issues with constipation you can use this as the package directions instructed.  You can buy this over-the-counter at any drugstore. (Patient not taking: Reported on 09/04/2017), Disp: 14 each, Rfl: 0 .  traMADol (ULTRAM) 50 MG tablet, You can take 1 tablet every 6 hours as needed for pain not relieved by Tylenol, ibuprofen, Robaxin, and your daily oxycodone. (Patient not taking: Reported on 09/04/2017), Disp: 30 tablet, Rfl: 0 .  zolpidem (AMBIEN) 5 MG tablet, Take 1 tablet (5 mg total) by mouth at bedtime for 15 days., Disp: 15 tablet, Rfl: 0

## 2017-09-04 NOTE — Patient Instructions (Signed)
Cigarette smoker: Stop smoking altogether  Healthcare associated pneumonia: We will repeat a chest x-ray today to make sure things are clearing up If there is evidence of an underlying fluid collection I may need to order a CT scan of your chest   Shortness of breath with leg swelling: We will check a test today called a proBNP to make sure there is no evidence of heart failure  Mild insomnia: Use Ambien at night as needed to help with sleep, 15 pills prescribed today, 0 refills Follow up with the [place] regarding this  Follow up here with an NP in 4 weeks

## 2017-10-02 ENCOUNTER — Ambulatory Visit (INDEPENDENT_AMBULATORY_CARE_PROVIDER_SITE_OTHER): Payer: Non-veteran care | Admitting: Primary Care

## 2017-10-02 ENCOUNTER — Ambulatory Visit (INDEPENDENT_AMBULATORY_CARE_PROVIDER_SITE_OTHER)
Admission: RE | Admit: 2017-10-02 | Discharge: 2017-10-02 | Disposition: A | Discharge status: Home / Self Care | Payer: Non-veteran care | Source: Ambulatory Visit | Attending: Primary Care | Admitting: Primary Care

## 2017-10-02 ENCOUNTER — Encounter: Payer: Self-pay | Admitting: Primary Care

## 2017-10-02 VITALS — BP 144/84 | HR 75 | Ht 69.0 in | Wt 133.8 lb

## 2017-10-02 DIAGNOSIS — F419 Anxiety disorder, unspecified: Secondary | ICD-10-CM

## 2017-10-02 DIAGNOSIS — Z9189 Other specified personal risk factors, not elsewhere classified: Secondary | ICD-10-CM | POA: Insufficient documentation

## 2017-10-02 DIAGNOSIS — R0602 Shortness of breath: Secondary | ICD-10-CM

## 2017-10-02 DIAGNOSIS — J449 Chronic obstructive pulmonary disease, unspecified: Secondary | ICD-10-CM | POA: Diagnosis not present

## 2017-10-02 DIAGNOSIS — J189 Pneumonia, unspecified organism: Secondary | ICD-10-CM

## 2017-10-02 DIAGNOSIS — G4700 Insomnia, unspecified: Secondary | ICD-10-CM

## 2017-10-02 DIAGNOSIS — J181 Lobar pneumonia, unspecified organism: Secondary | ICD-10-CM | POA: Diagnosis not present

## 2017-10-02 MED ORDER — UMECLIDINIUM-VILANTEROL 62.5-25 MCG/INH IN AEPB: 1.0000 | INHALATION_SPRAY | Freq: Every day | RESPIRATORY_TRACT | 6 refills | Status: DC

## 2017-10-02 MED ORDER — UMECLIDINIUM-VILANTEROL 62.5-25 MCG/INH IN AEPB: 1.0000 | INHALATION_SPRAY | Freq: Every day | RESPIRATORY_TRACT | 0 refills | Status: DC

## 2017-10-02 MED ORDER — ALBUTEROL SULFATE HFA 108 (90 BASE) MCG/ACT IN AERS: 2.0000 | INHALATION_SPRAY | Freq: Four times a day (QID) | RESPIRATORY_TRACT | 6 refills | Status: DC | PRN

## 2017-10-02 MED ORDER — UMECLIDINIUM-VILANTEROL 62.5-25 MCG/INH IN AEPB: 1.0000 | INHALATION_SPRAY | Freq: Every day | RESPIRATORY_TRACT | Status: DC

## 2017-10-02 NOTE — Patient Instructions (Addendum)
Office treatments: Received flu shot today   Testing: Spirometry today showed obstruction  CXR today  RX: Starting you on Anoro- 1 puff daily (take every day no matter how you feel)  Follow-up: Patient needs HST for symptoms of apnea  FU in 4-6 weeks with Dr. [place] Bells for sleep test results

## 2017-10-02 NOTE — Assessment & Plan Note (Signed)
-   Suspected sleep apnea - Needs HST - FU with Dr. [place] Bells in 4-6 weeks

## 2017-10-02 NOTE — Progress Notes (Signed)
Reviewed, discussed, agree 

## 2017-10-02 NOTE — Assessment & Plan Note (Addendum)
-   Stable; Ambien 5mg  per [facility]

## 2017-10-02 NOTE — Assessment & Plan Note (Addendum)
-   Stable; Lorazepam 0.5 mg q8hrs per [place]

## 2017-10-02 NOTE — Progress Notes (Signed)
@Patient  ID: Gerald Farrell, male    DOB: 1945-10-20, 72 y.o.   MRN: [id number]  Chief Complaint  Patient presents with  . PPD Reading    BQ patient; Pt states he is feeling slightly better but notes Insomnia-was given Ambien and Lorazepam by the [facility] doctor. Pt notes panic attacks now as well as increase in SOB episodes.      Referring provider: No ref. provider found  HPI: 72 year old male, current everyday smoker. PMH pneumonia RUL, abnormal chest CT. Patient of Dr. [place] Bells, last seen on 09/04/17 for follow-up visit regarding recent PNA.   First seen in July 2019 for HCAP in the setting of acute cholecystitis and SBO. Bronchoscopy performed by Dr. Lamonte Sakai in July 2019. Slow to recover with continued sob, dyspnea when lying down and cough. BNP was normal at 55. Repeat CXR showed persistent atelectasis with overall improved RIGHT basilar infiltrate since previous exam.  10/02/2017 Patient presents today for 4 week follow-up visit. Prescribed ambien and lorazepam for insomnia and anxiety from [place]. Feels it helps, still has some shortness of breath after eating a large meal at night and when lying down to go to sleep. States that he's had a terrible time sleeping, having panic attacks at night and waking up short of breath. Patient wants to make sure PNA has resolved. Also states that he was told her had COPD while in the hospital. For the most part he is not smoking, slipped up her and there.    No Known Allergies  Immunization History  Administered Date(s) Administered  . Influenza Split 01/30/2017    Past Medical History:  Diagnosis Date  . Acute cholecystitis 04/27/2017  . Alcohol abuse   . Basal cell carcinoma   . Hypertension     Tobacco History: Social History   Tobacco Use  Smoking Status Current Some Day Smoker  . Packs/day: 0.30  . Types: Cigarettes  Smokeless Tobacco Never Used  Tobacco Comment   2 cigarettes a week-using nicotine patches to help   Ready to quit:  Not Answered Counseling given: Not Answered Comment: 2 cigarettes a week-using nicotine patches to help   Outpatient Medications Prior to Visit  Medication Sig Dispense Refill  . acetaminophen (TYLENOL) 500 MG tablet He can take 2 tablets every 8 hours as needed for pain.  This should be your first choice for pain medications.  Do not exceed more than 4000 mg of Tylenol per day.  This can harm your liver.  You can buy this at any drugstore over-the-counter. 30 tablet 0  . amLODipine (NORVASC) 10 MG tablet Take 5 mg by mouth daily.    . carvedilol (COREG) 25 MG tablet Do not restart this medicine until you have talked with your primary care and they have recommended you resume it.  Your blood pressure has been too low while you were here to be on this.    . cloNIDine (CATAPRES) 0.1 MG tablet Take 0.1 mg by mouth 3 (three) times daily.    Marland Kitchen ENSURE (ENSURE) Take 0.5 Cans by mouth 3 (three) times daily between meals.    . gabapentin (NEURONTIN) 400 MG capsule Take 800 mg by mouth 3 (three) times daily.    Marland Kitchen lisinopril (PRINIVIL,ZESTRIL) 40 MG tablet You are currently taking this as 1 tablet daily;   You were listed as taking this twice a day before admission.    Marland Kitchen LORazepam (ATIVAN) 0.5 MG tablet Take 0.5 mg by mouth every 8 (eight) hours.    Marland Kitchen  meloxicam (MOBIC) 15 MG tablet Take 15 mg by mouth daily.    . methocarbamol (ROBAXIN) 750 MG tablet Take 750 mg by mouth 3 (three) times daily as needed for muscle spasms.     . Multiple Vitamins-Iron (MULTIVITAMINS WITH IRON) TABS tablet He can buy a One-A-Day multiple vitamin with iron and take 1 daily.  Follow package directions.  You can buy this over-the-counter at any drugstore.  0  . nicotine (NICODERM CQ - DOSED IN MG/24 HOURS) 14 mg/24hr patch If you need this to stay off your cigarettes you can buy this over-the-counter at any drugstore and follow the package directions. 28 patch 0  . omeprazole (PRILOSEC) 20 MG capsule Take 40 mg by mouth daily.      . polyethylene glycol (MIRALAX / GLYCOLAX) packet If you have issues with constipation you can use this as the package directions instructed.  You can buy this over-the-counter at any drugstore. 14 each 0  . Potassium Chloride ER 20 MEQ TBCR Take 20 mEq by mouth daily. 90 tablet 1  . tamsulosin (FLOMAX) 0.4 MG CAPS capsule Take 0.4 mg by mouth daily.    . traMADol (ULTRAM) 50 MG tablet You can take 1 tablet every 6 hours as needed for pain not relieved by Tylenol, ibuprofen, Robaxin, and your daily oxycodone. 30 tablet 0  . zolpidem (AMBIEN) 5 MG tablet Take 1 tablet (5 mg total) by mouth at bedtime for 15 days. 15 tablet 0   No facility-administered medications prior to visit.     Review of Systems  Review of Systems  Constitutional: Negative.   HENT: Negative.   Respiratory: Positive for shortness of breath. Negative for wheezing.   Cardiovascular: Negative.   Psychiatric/Behavioral: Positive for sleep disturbance. The patient is nervous/anxious.     Physical Exam  BP (!) 144/84 (BP Location: Left Arm, Cuff Size: Normal)   Pulse 75   Ht 5\' 9"  (1.753 m)   Wt 133 lb 12.8 oz (60.7 kg)   SpO2 97%   BMI 19.76 kg/m  Physical Exam  Constitutional: He is oriented to person, place, and time. He appears well-developed and well-nourished.  Thin adult male, no acute distress  HENT:  Head: Normocephalic and atraumatic.  Eyes: Pupils are equal, round, and reactive to light. EOM are normal.  Neck: Normal range of motion. Neck supple.  Cardiovascular: Normal rate and regular rhythm.  Pulmonary/Chest: Effort normal and breath sounds normal. No respiratory distress. He has no wheezes.  CTA   Abdominal: There is no tenderness.  Neurological: He is alert and oriented to person, place, and time.  Skin: Skin is warm and dry. No rash noted. No erythema.  Psychiatric: He has a normal mood and affect. His behavior is normal. Judgment normal.     Lab Results:  CBC    Component Value  Date/Time   WBC 9.8 07/30/2017 0429   RBC 2.91 (L) 07/30/2017 0429   HGB 8.6 (L) 07/30/2017 0429   HCT 26.8 (L) 07/30/2017 0429   PLT 273 07/30/2017 0429   MCV 92.1 07/30/2017 0429   MCH 29.6 07/30/2017 0429   MCHC 32.1 07/30/2017 0429   RDW 14.8 07/30/2017 0429   LYMPHSABS 0.7 07/29/2017 0305   MONOABS 1.3 (H) 07/29/2017 0305   EOSABS 0.1 07/29/2017 0305   BASOSABS 0.0 07/29/2017 0305    BMET    Component Value Date/Time   NA 136 07/30/2017 0429   K 3.5 07/30/2017 0429   CL 106 07/30/2017 0429  CO2 26 07/30/2017 0429   GLUCOSE 85 07/30/2017 0429   BUN 6 (L) 07/30/2017 0429   CREATININE 0.42 (L) 07/30/2017 0429   CALCIUM 8.0 (L) 07/30/2017 0429   GFRNONAA >60 07/30/2017 0429   GFRAA >60 07/30/2017 0429    BNP No results found for: BNP  ProBNP    Component Value Date/Time   PROBNP 55.0 09/04/2017 1251    Imaging: Dg Chest 2 View  Result Date: 10/02/2017 CLINICAL DATA:  Dyspnea.  Follow up abnormal radiograph.  Smoker. EXAM: CHEST - 2 VIEW COMPARISON:  Chest radiograph September 04, 2017 FINDINGS: Cardiac silhouette is normal in size, mediastinal silhouette is not suspicious. Persistently elevated RIGHT hemidiaphragm with RIGHT mid lung zone bandlike density. No pleural effusion or focal consolidation. No pneumothorax. Surgical clips in the included right abdomen compatible with cholecystectomy. Soft tissue planes and included osseous structures are non suspicious. IMPRESSION: Stable RIGHT mid lung zone atelectasis or scarring. Electronically Signed   By: Elon Alas M.D.   On: 10/02/2017 15:55   Dg Chest 2 View  Result Date: 09/04/2017 CLINICAL DATA:  Dyspnea, hypertension, pneumonia EXAM: CHEST - 2 VIEW COMPARISON:  08/13/2017 FINDINGS: Normal heart size, mediastinal contours, and pulmonary vascularity. Atelectasis at minor fissure and anterior mid RIGHT lung. Improved RIGHT basilar infiltrate since previous exam. Remaining lungs clear. Chronic elevation of RIGHT  diaphragm. No pleural effusion or pneumothorax. Bones unremarkable. IMPRESSION: Persistent atelectasis in RIGHT mid at minor fissure with overall improved RIGHT basilar infiltrate since previous exam. Electronically Signed   By: Lavonia Dana M.D.   On: 09/04/2017 17:39     Assessment & Plan:   Pneumonia of right upper lobe due to infectious organism ([place]) - Repeat CXR today showed fully resolved PNA. NO evidence of pleural effusion or consolidation. Stable atelectasis or scarring right mid base.   COPD (chronic obstructive pulmonary disease) (HCC) - Current some day smoker - Spirometry 10/02/2017 showed obstruction    FVC 3.3 (68%), FEV1 2.1 (68%), ratio 65  - Start trial Anoro 1 puff daily - FU in 4 weeks with Dr. [place] B[place]    At risk for sleep apnea - Suspected sleep apnea - Needs HST - FU with Dr. [place] Bells in 4-6 weeks   Insomnia - Stable; Ambien 5mg  [facility]  Anxiety - Stable; Lorazepam 0.5 mg q8hrs per [facility], NP 10/02/2017

## 2017-10-02 NOTE — Assessment & Plan Note (Addendum)
-   Current some day smoker - Spirometry 10/02/2017 showed obstruction    FVC 3.3 (68%), FEV1 2.1 (68%), ratio 65  - Start trial Anoro 1 puff daily - FU in 4 weeks with Dr. [place] Bells

## 2017-10-02 NOTE — Assessment & Plan Note (Signed)
-   Repeat CXR today showed fully resolved PNA. NO evidence of pleural effusion or consolidation. Stable atelectasis or scarring right mid base.

## 2017-10-18 ENCOUNTER — Other Ambulatory Visit: Payer: Self-pay

## 2017-10-18 ENCOUNTER — Encounter (HOSPITAL_COMMUNITY): Payer: Self-pay | Admitting: Emergency Medicine

## 2017-10-18 ENCOUNTER — Emergency Department (HOSPITAL_COMMUNITY)
Admission: EM | Admit: 2017-10-18 | Discharge: 2017-10-19 | Disposition: A | Discharge status: Home / Self Care | Payer: Non-veteran care | Attending: Emergency Medicine | Admitting: Emergency Medicine

## 2017-10-18 DIAGNOSIS — I10 Essential (primary) hypertension: Secondary | ICD-10-CM | POA: Insufficient documentation

## 2017-10-18 DIAGNOSIS — R1030 Lower abdominal pain, unspecified: Secondary | ICD-10-CM | POA: Diagnosis present

## 2017-10-18 DIAGNOSIS — F17210 Nicotine dependence, cigarettes, uncomplicated: Secondary | ICD-10-CM | POA: Insufficient documentation

## 2017-10-18 DIAGNOSIS — Z79899 Other long term (current) drug therapy: Secondary | ICD-10-CM | POA: Diagnosis not present

## 2017-10-18 DIAGNOSIS — J449 Chronic obstructive pulmonary disease, unspecified: Secondary | ICD-10-CM | POA: Insufficient documentation

## 2017-10-18 DIAGNOSIS — K5900 Constipation, unspecified: Secondary | ICD-10-CM | POA: Diagnosis not present

## 2017-10-18 HISTORY — DX: Chronic obstructive pulmonary disease, unspecified: J44.9

## 2017-10-18 LAB — URINALYSIS, ROUTINE W REFLEX MICROSCOPIC
BILIRUBIN URINE: NEGATIVE
Glucose, UA: NEGATIVE mg/dL
Hgb urine dipstick: NEGATIVE
Ketones, ur: NEGATIVE mg/dL
LEUKOCYTES UA: NEGATIVE
NITRITE: NEGATIVE
Protein, ur: NEGATIVE mg/dL
Specific Gravity, Urine: 1.015 (ref 1.005–1.030)
pH: 6 (ref 5.0–8.0)

## 2017-10-18 LAB — COMPREHENSIVE METABOLIC PANEL
ALBUMIN: 3.7 g/dL (ref 3.5–5.0)
ALT: 14 U/L (ref 0–44)
AST: 19 U/L (ref 15–41)
Alkaline Phosphatase: 69 U/L (ref 38–126)
Anion gap: 8 (ref 5–15)
BUN: 13 mg/dL (ref 8–23)
CHLORIDE: 107 mmol/L (ref 98–111)
CO2: 26 mmol/L (ref 22–32)
Calcium: 9.3 mg/dL (ref 8.9–10.3)
Creatinine, Ser: 0.67 mg/dL (ref 0.61–1.24)
GFR calc Af Amer: >60 mL/min (ref >60)
GFR calc non Af Amer: >60 mL/min (ref >60)
GLUCOSE: 104 mg/dL — AB (ref 70–99)
POTASSIUM: 3.8 mmol/L (ref 3.5–5.1)
Sodium: 141 mmol/L (ref 135–145)
Total Bilirubin: 0.4 mg/dL (ref 0.3–1.2)
Total Protein: 6.7 g/dL (ref 6.5–8.1)

## 2017-10-18 LAB — CBC
HEMATOCRIT: 42.2 % (ref 39.0–52.0)
HEMOGLOBIN: 13.2 g/dL (ref 13.0–17.0)
MCH: 28.4 pg (ref 26.0–34.0)
MCHC: 31.3 g/dL (ref 30.0–36.0)
MCV: 90.8 fL (ref 80.0–100.0)
Platelets: 206 10*3/uL (ref 150–400)
RBC: 4.65 MIL/uL (ref 4.22–5.81)
RDW: 17.6 % — AB (ref 11.5–15.5)
WBC: 5.5 10*3/uL (ref 4.0–10.5)
nRBC: 0 % (ref 0.0–0.2)

## 2017-10-18 LAB — LIPASE, BLOOD: LIPASE: 24 U/L (ref 11–51)

## 2017-10-18 NOTE — ED Triage Notes (Signed)
Pt reports intermittent generalized increasing abd pain over the last month but has become consistent over the last few days. Pt reports constant pain and cramping now. Pt reports sob/anxiety with pain. Pt had a small bowel tear repair two months ago, also gallbladder removal in April. Denies N/V/D. Last BM yesterday. Recently diagnosed with COPD 3 weeks ago, bilateral leg swelling.

## 2017-10-18 NOTE — ED Notes (Signed)
Results reviewed.  No changes in acuity at this time 

## 2017-10-18 NOTE — ED Provider Notes (Signed)
Patient placed in Quick Look pathway, seen and evaluated   Chief Complaint: Abdominal pain  HPI:  Patient with numerous abdominal surgeries presents with acute onset of severe lower abdominal pain today.  Denies nausea, vomiting, urinary symptoms.  Last bowel movement was yesterday.  No fevers.  Has been passing flatus.  Notes recent diagnosis of COPD which is being worked up with low-power pulmonology, back pain awaiting MRI of the [place].  ROS: +abd pain  Physical Exam:   Gen: No distress  Neuro: Awake and Alert  Skin: Warm    Focused Exam: Midline surgical scar.  Tenderness palpation in the suprapubic and left lower quadrant region of the abdomen.  No guarding or rebound.  No CVA tenderness.  Active bowel sounds noted.   Initiation of care has begun. The patient has been counseled on the process, plan, and necessity for staying for the completion/evaluation, and the remainder of the medical screening examination    Debroah Baller 94/70/96 [id number]    Delora Fuel, MD 62/94/76 [phone]

## 2017-10-19 ENCOUNTER — Emergency Department (HOSPITAL_COMMUNITY): Payer: Non-veteran care

## 2017-10-19 MED ORDER — IOHEXOL 300 MG/ML  SOLN
100.0000 mL | Freq: Once | INTRAMUSCULAR | Status: AC | PRN
  Administered 2017-10-19: 100 mL via INTRAVENOUS

## 2017-10-19 NOTE — Discharge Instructions (Addendum)
Use miralax and a stool softener daily for the next 2 weeks.  Increase the amount of water you are drinking. Follow up with your primary care doctor about your symptoms.  Return to the ER if you develop severe/constant abdominal pain, persistent vomiting, inability to have a bowel movement, or any new or concerning symptoms.

## 2017-10-19 NOTE — ED Provider Notes (Signed)
[facility] EMERGENCY DEPARTMENT Provider Note   CSN: [id number] Arrival date & time: 10/18/17  1851     History   Chief Complaint Chief Complaint  Patient presents with  . Abdominal Pain    HPI KENDELL SAGRAVES is a 72 y.o. male presenting for evaluation of abdominal pain.  Patient states for the past month, he has been having intermittent abdominal pain.  Over the past 3 days, the pain has become more constant.  Additionally, patient states today he felt sick, but is unable to describe anymore.  Patient states pain is mostly in the central abdomen, usually moving from one side to the other.  Nothing makes the pain better or worse.  He denies associated fevers, chills, chest pain, shortness of breath, nausea, vomiting, urinary symptoms.  Patient states he has had to strain more frequently while having a bowel movement.  Last bowel movement was yesterday.  He is not taking anything for constipation.  Patient tolerating p.o. without difficulty.  In April, patient had a gangrenous gallbladder which had to be removed surgically.  In July, patient had small bowel obstruction with rectus tear.  After surgery, patient symptoms completely resolved before abdominal pain returned more recently.  Patient states pain today feels similar to when he had a small bowel obstruction.   HPI  Past Medical History:  Diagnosis Date  . Acute cholecystitis 04/27/2017  . Alcohol abuse   . Basal cell carcinoma   . COPD (chronic obstructive pulmonary disease) ([place])   . Hypertension     Patient Active Problem List   Diagnosis Date Noted  . COPD (chronic obstructive pulmonary disease) ([place]) 10/02/2017  . At risk for sleep apnea 10/02/2017  . Insomnia 10/02/2017  . Anxiety 10/02/2017  . Pneumonia of right upper lobe due to infectious organism ([place]) 08/13/2017  . Protein-calorie malnutrition, severe 07/23/2017  . Status post surgery 07/18/2017  . Cough   . Abnormal CT of the chest   .  Small bowel obstruction ([place]) 07/14/2017  . Abnormal cholangiogram   . Bile duct leak     Past Surgical History:  Procedure Laterality Date  . BASAL CELL CARCINOMA EXCISION    . CHOLECYSTECTOMY N/A 04/28/2017   Procedure: LAPAROSCOPIC CHOLECYSTECTOMY;  Surgeon: Ralene Ok, MD;  Location: [facility];  Service: General;  Laterality: N/A;  . ENDOSCOPIC RETROGRADE CHOLANGIOPANCREATOGRAPHY (ERCP) WITH PROPOFOL N/A 06/26/2017   Procedure: ENDOSCOPIC RETROGRADE CHOLANGIOPANCREATOGRAPHY (ERCP) WITH PROPOFOL;  Surgeon: Gatha Mayer, MD;  Location: [facility];  Service: Endoscopy;  Laterality: N/A;  . ERCP N/A 04/30/2017   Procedure: ENDOSCOPIC RETROGRADE CHOLANGIOPANCREATOGRAPHY (ERCP);  Surgeon: Gatha Mayer, MD;  Location: [facility];  Service: Endoscopy;  Laterality: N/A;  . LAPAROSCOPY N/A 07/18/2017   Procedure: LAPAROSCOPY DIAGNOSTIC, lysis of adhesions;  Surgeon: Greer Pickerel, MD;  Location: [facility];  Service: General;  Laterality: N/A;  . LAPAROTOMY  07/18/2017   Procedure: EXPLORATORY LAPAROTOMY, COMPLETION OF CHOLECYSTECTOMY, REPAIR OF SMALL BOWEL CIRRHOSIS, SMALL BOWEL RESECTION.;  Surgeon: Greer Pickerel, MD;  Location: [facility];  Service: General;;  . LUMBAR LAMINECTOMY    . REPLACEMENT TOTAL KNEE BILATERAL    . STENT REMOVAL  06/26/2017   Procedure: STENT REMOVAL;  Surgeon: Gatha Mayer, MD;  Location: [facility];  Service: Endoscopy;;  . VIDEO BRONCHOSCOPY N/A 07/18/2017   Procedure: VIDEO BRONCHOSCOPY;  Surgeon: Collene Gobble, MD;  Location: [facility] OR;  Service: Thoracic;  Laterality: N/A;        Home Medications  Prior to Admission medications   Medication Sig Start Date End Date Taking? Authorizing Provider  acetaminophen (TYLENOL) 500 MG tablet He can take 2 tablets every 8 hours as needed for pain.  This should be your first choice for pain medications.  Do not exceed more than 4000 mg of Tylenol per day.  This can harm your liver.  You can buy this at any drugstore  over-the-counter. 08/01/17   Earnstine Regal, PA-C  albuterol (PROVENTIL HFA;VENTOLIN HFA) 108 (90 Base) MCG/ACT inhaler Inhale 2 puffs into the lungs every 6 (six) hours as needed for wheezing or shortness of breath. 10/02/17   Martyn Ehrich, NP  amLODipine (NORVASC) 10 MG tablet Take 5 mg by mouth daily.    [provider]  carvedilol (COREG) 25 MG tablet Do not restart this medicine until you have talked with your primary care and they have recommended you resume it.  Your blood pressure has been too low while you were here to be on this. 08/01/17   Earnstine Regal, PA-C  cloNIDine (CATAPRES) 0.1 MG tablet Take 0.1 mg by mouth 3 (three) times daily.    [provider]  ENSURE (ENSURE) Take 0.5 Cans by mouth 3 (three) times daily between meals.    [provider]  gabapentin (NEURONTIN) 400 MG capsule Take 800 mg by mouth 3 (three) times daily.    [provider]  lisinopril (PRINIVIL,ZESTRIL) 40 MG tablet You are currently taking this as 1 tablet daily;   You were listed as taking this twice a day before admission. 08/01/17   Earnstine Regal, PA-C  LORazepam (ATIVAN) 0.5 MG tablet Take 0.5 mg by mouth every 8 (eight) hours.    [provider]  meloxicam (MOBIC) 15 MG tablet Take 15 mg by mouth daily.    [provider]  methocarbamol (ROBAXIN) 750 MG tablet Take 750 mg by mouth 3 (three) times daily as needed for muscle spasms.     [provider]  Multiple Vitamins-Iron (MULTIVITAMINS WITH IRON) TABS tablet He can buy a One-A-Day multiple vitamin with iron and take 1 daily.  Follow package directions.  You can buy this over-the-counter at any drugstore. 08/01/17   Earnstine Regal, PA-C  nicotine (NICODERM CQ - DOSED IN MG/24 HOURS) 14 mg/24hr patch If you need this to stay off your cigarettes you can buy this over-the-counter at any drugstore and follow the package directions. 08/01/17   Earnstine Regal, PA-C  omeprazole (PRILOSEC)  20 MG capsule Take 40 mg by mouth daily.     [provider]  polyethylene glycol (MIRALAX / GLYCOLAX) packet If you have issues with constipation you can use this as the package directions instructed.  You can buy this over-the-counter at any drugstore. 08/01/17   Earnstine Regal, PA-C  Potassium Chloride ER 20 MEQ TBCR Take 20 mEq by mouth daily. 06/21/17   Gatha Mayer, MD  tamsulosin (FLOMAX) 0.4 MG CAPS capsule Take 0.4 mg by mouth daily.    [provider]  umeclidinium-vilanterol (ANORO ELLIPTA) 62.5-25 MCG/INH AEPB Inhale 1 puff into the lungs daily. 10/02/17   Martyn Ehrich, NP  umeclidinium-vilanterol (ANORO ELLIPTA) 62.5-25 MCG/INH AEPB Inhale 1 puff into the lungs daily. 10/02/17   Martyn Ehrich, NP  zolpidem (AMBIEN) 5 MG tablet Take 1 tablet (5 mg total) by mouth at bedtime for 15 days. 09/04/17 09/19/17  Juanito Doom, MD    Family History Family History  Problem Relation Age of Onset  . Heart  disease Mother   . Heart disease Father     Social History Social History   Tobacco Use  . Smoking status: Current Some Day Smoker    Packs/day: 0.30    Types: Cigarettes  . Smokeless tobacco: Never Used  . Tobacco comment: 2 cigarettes a week-using nicotine patches to help  Substance Use Topics  . Alcohol use: Not Currently  . Drug use: Not Currently     Allergies   Patient has no known allergies.   Review of Systems Review of Systems  Gastrointestinal: Positive for abdominal pain.  All other systems reviewed and are negative.    Physical Exam Updated Vital Signs BP (!) 161/80   Pulse 61   Temp 98.2 F (36.8 C) (Oral)   Resp (!) 28   Ht 5\' 9"  (1.753 m)   Wt 61.2 kg   SpO2 98%   BMI 19.94 kg/m   Physical Exam  Constitutional: He is oriented to person, place, and time. He appears well-developed and well-nourished. No distress.  Elderly male in no acute distress  HENT:  Head: Normocephalic and atraumatic.  Eyes: Pupils are  equal, round, and reactive to light. Conjunctivae and EOM are normal.  Neck: Normal range of motion. Neck supple.  Cardiovascular: Normal rate, regular rhythm and intact distal pulses.  Pulmonary/Chest: Effort normal and breath sounds normal. No respiratory distress. He has no wheezes.  Abdominal: Soft. He exhibits no distension and no mass. There is tenderness. There is no rebound and no guarding.  Generalized tenderness palpation of the abdomen.  Soft without rigidity, guarding or distention.  Negative rebound.  No signs of peritonitis.  Musculoskeletal: Normal range of motion.  Neurological: He is alert and oriented to person, place, and time.  Skin: Skin is warm and dry. Capillary refill takes less than 2 seconds.  Psychiatric: He has a normal mood and affect.  Nursing note and vitals reviewed.    ED Treatments / Results  Labs (all labs ordered are listed, but only abnormal results are displayed) Labs Reviewed  COMPREHENSIVE METABOLIC PANEL - Abnormal; Notable for the following components:      Result Value   Glucose, Bld 104 (*)    All other components within normal limits  CBC - Abnormal; Notable for the following components:   RDW 17.6 (*)    All other components within normal limits  LIPASE, BLOOD  URINALYSIS, ROUTINE W REFLEX MICROSCOPIC    EKG EKG Interpretation  Date/Time:  Friday October 18 2017 18:56:58 EDT Ventricular Rate:  75 PR Interval:  208 QRS Duration: 86 QT Interval:  400 QTC Calculation: 446 R Axis:   -86 Text Interpretation:  Normal sinus rhythm Left axis deviation RSR' or QR pattern in V1 suggests right ventricular conduction delay Possible Anterior infarct , age undetermined Abnormal ECG When compared with ECG of 07/26/2017, No significant change was found Confirmed by Delora Fuel ([id number]) on 10/18/2017 11:02:48 PM   Radiology Ct Abdomen Pelvis W Contrast  Result Date: 10/19/2017 CLINICAL DATA:  Generalized abdominal pain history of recent  abdominal surgery EXAM: CT ABDOMEN AND PELVIS WITH CONTRAST TECHNIQUE: Multidetector CT imaging of the abdomen and pelvis was performed using the standard protocol following bolus administration of intravenous contrast. CONTRAST:  178mL OMNIPAQUE IOHEXOL 300 MG/ML  SOLN COMPARISON:  07/17/2017, 07/14/2017, 04/27/2017 CT FINDINGS: Lower chest: Lung bases demonstrate no acute consolidation or effusion. The heart size is within normal limits. Hepatobiliary: No focal hepatic abnormality. Post cholecystectomy. No significant biliary dilatation. Pancreas: Unremarkable. No  pancreatic ductal dilatation or surrounding inflammatory changes. Spleen: Normal in size without focal abnormality. Adrenals/Urinary Tract: Adrenal glands are normal. No hydronephrosis. Cyst in the mid left kidney. Negative urinary bladder. Stomach/Bowel: The stomach is moderately enlarged and fluid-filled. Interval postsurgical changes of the small bowel within the central abdomen, with slightly enlarged fluid-filled segment of small bowel in the region of surgical change there is no additional dilated small bowel. There is no colon wall thickening. Large volume of stool in the colon. Appendix is negative. Vascular/Lymphatic: Mild aortic atherosclerosis. No aneurysmal dilatation. No significantly enlarged lymph nodes Reproductive: Prostate is unremarkable. Other: Negative for free air or free fluid Musculoskeletal: Degenerative changes of the spine. No acute or suspicious abnormality. IMPRESSION: 1. Interval postsurgical changes of central small bowel with slightly enlarged fluid-filled segment of small bowel in the region of surgical change but without convincing evidence for small bowel obstruction. 2. Large amount of stool throughout the colon, possible constipation 3. No definite CT evidence for acute intra-abdominal or pelvic abnormality. Electronically Signed   By: Donavan Foil M.D.   On: 10/19/2017 03:32    Procedures Procedures (including  critical care time)  Medications Ordered in ED Medications  iohexol (OMNIPAQUE) 300 MG/ML solution 100 mL (100 mLs Intravenous Contrast Given 10/19/17 0304)     Initial Impression / Assessment and Plan / ED Course  I have reviewed the triage vital signs and the nursing notes.  Pertinent labs & imaging results that were available during my care of the patient were reviewed by me and considered in my medical decision making (see chart for details).     Pt presenting for evaluation of abdominal pain.  Physical exam reassuring, he is afebrile not tachycardic.  Appears nontoxic.  Abdominal exam with generalized mild tenderness, but otherwise reassuring.  Labs reassuring, no leukocytosis.  Kidney, liver, pancreatic function reassuring.  However, as patient had recent small bowel obstruction and has had a complicated abdominal surgical history of the past year, will obtain CT scan for further evaluation. Case discussed with attending, Dr. Roxanne Mins evaluated the pt.   CT shows large stool burden.  No signs of intra-abdominal infection, perforation, obstruction, or surgical abdomen.  Discussed findings with patient and son.  Discussed importance of making sure he is having regular bowel movements.  Hydration encouraged.  Patient to use MiraLAX and stool softeners as needed to have regular bowel movements.  Patient encouraged to follow-up with his PCP.  At this time, patient appears safe for discharge.  Return precautions given.  Patient states he understands and agrees plan.  Final Clinical Impressions(s) / ED Diagnoses   Final diagnoses:  Constipation, unspecified constipation type    ED Discharge Orders    None       Franchot Heidelberg, PA-C 98/33/82 [id number]    Delora Fuel, MD 97/67/34 [phone]

## 2017-10-30 ENCOUNTER — Ambulatory Visit: Payer: Non-veteran care | Admitting: Pulmonary Disease

## 2017-10-30 NOTE — Progress Notes (Deleted)
Synopsis: First seen in July 2019 for HCAP in the setting of acute cholecystitis and a small bowel obstruction.  He is a longtime smoker.  He had a bronchoscopy performed by Dr. Lamonte Sakai in July 2019 for the same.  Subjective:   PATIENT ID: Gerald Farrell GENDER: male DOB: [birth year], MRN: [id number]   HPI  No chief complaint on file.   ***  Past Medical History:  Diagnosis Date  . Acute cholecystitis 04/27/2017  . Alcohol abuse   . Basal cell carcinoma   . COPD (chronic obstructive pulmonary disease) ([place])   . Hypertension       ROS    Objective:  Physical Exam   There were no vitals filed for this visit.  ***   CBC    Component Value Date/Time   WBC 5.5 10/18/2017 1937   RBC 4.65 10/18/2017 1937   HGB 13.2 10/18/2017 1937   HCT 42.2 10/18/2017 1937   PLT 206 10/18/2017 1937   MCV 90.8 10/18/2017 1937   MCH 28.4 10/18/2017 1937   MCHC 31.3 10/18/2017 1937   RDW 17.6 (H) 10/18/2017 1937   LYMPHSABS 0.7 07/29/2017 0305   MONOABS 1.3 (H) 07/29/2017 0305   EOSABS 0.1 07/29/2017 0305   BASOSABS 0.0 07/29/2017 0305     Chest imaging: August 13, 2017 chest x-ray images independently reviewed showing what appears to be a right lower lobe effusion.  Improve right upper lobe airspace disease.  PFT:  Labs:  Path: September 18, 2017 cytology BAL negative, inflammatory changes noted.  Echo:  Heart Catheterization:  July 2019 BAL bacterial, fungal, AFB negative     Assessment & Plan:   No diagnosis found.  Discussion: ***   Current Outpatient Medications:  .  acetaminophen (TYLENOL) 500 MG tablet, He can take 2 tablets every 8 hours as needed for pain.  This should be your first choice for pain medications.  Do not exceed more than 4000 mg of Tylenol per day.  This can harm your liver.  You can buy this at any drugstore over-the-counter., Disp: 30 tablet, Rfl: 0 .  albuterol (PROVENTIL HFA;VENTOLIN HFA) 108 (90 Base) MCG/ACT inhaler, Inhale 2  puffs into the lungs every 6 (six) hours as needed for wheezing or shortness of breath., Disp: 1 Inhaler, Rfl: 6 .  amLODipine (NORVASC) 10 MG tablet, Take 5 mg by mouth daily., Disp: , Rfl:  .  carvedilol (COREG) 25 MG tablet, Do not restart this medicine until you have talked with your primary care and they have recommended you resume it.  Your blood pressure has been too low while you were here to be on this., Disp: , Rfl:  .  cloNIDine (CATAPRES) 0.1 MG tablet, Take 0.1 mg by mouth 3 (three) times daily., Disp: , Rfl:  .  ENSURE (ENSURE), Take 0.5 Cans by mouth 3 (three) times daily between meals., Disp: , Rfl:  .  gabapentin (NEURONTIN) 400 MG capsule, Take 800 mg by mouth 3 (three) times daily., Disp: , Rfl:  .  lisinopril (PRINIVIL,ZESTRIL) 40 MG tablet, You are currently taking this as 1 tablet daily;   You were listed as taking this twice a day before admission., Disp: , Rfl:  .  LORazepam (ATIVAN) 0.5 MG tablet, Take 0.5 mg by mouth every 8 (eight) hours., Disp: , Rfl:  .  meloxicam (MOBIC) 15 MG tablet, Take 15 mg by mouth daily., Disp: , Rfl:  .  methocarbamol (ROBAXIN) 750 MG tablet, Take 750 mg by mouth 3 (  three) times daily as needed for muscle spasms. , Disp: , Rfl:  .  Multiple Vitamins-Iron (MULTIVITAMINS WITH IRON) TABS tablet, He can buy a One-A-Day multiple vitamin with iron and take 1 daily.  Follow package directions.  You can buy this over-the-counter at any drugstore., Disp: , Rfl: 0 .  nicotine (NICODERM CQ - DOSED IN MG/24 HOURS) 14 mg/24hr patch, If you need this to stay off your cigarettes you can buy this over-the-counter at any drugstore and follow the package directions., Disp: 28 patch, Rfl: 0 .  omeprazole (PRILOSEC) 20 MG capsule, Take 40 mg by mouth daily. , Disp: , Rfl:  .  polyethylene glycol (MIRALAX / GLYCOLAX) packet, If you have issues with constipation you can use this as the package directions instructed.  You can buy this over-the-counter at any drugstore.,  Disp: 14 each, Rfl: 0 .  Potassium Chloride ER 20 MEQ TBCR, Take 20 mEq by mouth daily., Disp: 90 tablet, Rfl: 1 .  tamsulosin (FLOMAX) 0.4 MG CAPS capsule, Take 0.4 mg by mouth daily., Disp: , Rfl:  .  umeclidinium-vilanterol (ANORO ELLIPTA) 62.5-25 MCG/INH AEPB, Inhale 1 puff into the lungs daily., Disp: 14 each, Rfl: 6 .  umeclidinium-vilanterol (ANORO ELLIPTA) 62.5-25 MCG/INH AEPB, Inhale 1 puff into the lungs daily., Disp: 1 each, Rfl: 0 .  zolpidem (AMBIEN) 5 MG tablet, Take 1 tablet (5 mg total) by mouth at bedtime for 15 days., Disp: 15 tablet, Rfl: 0

## 2018-02-03 DIAGNOSIS — M4306 Spondylolysis, lumbar region: Secondary | ICD-10-CM | POA: Insufficient documentation

## 2018-02-03 DIAGNOSIS — M5116 Intervertebral disc disorders with radiculopathy, lumbar region: Secondary | ICD-10-CM | POA: Insufficient documentation

## 2018-02-24 ENCOUNTER — Telehealth: Payer: Self-pay | Admitting: Primary Care

## 2018-02-24 NOTE — Telephone Encounter (Signed)
-----   Message from Martyn Ehrich, NP sent at 02/11/2018  3:40 PM EST ----- Regarding: RE: HST Ok, thank you ----- Message ----- From: Tana [place] Sent: 02/11/2018   3:14 PM EST To: Martyn Ehrich, NP Subject: HST                                            I have been trying to get this patient setup to do HST since you placed the order on 10/02/2017. Each time I have talked with him he keeps asking me to call him back later each time. When I spoke with him today I ask if he would like for me to put the HST order back in our stack and when he was ready he could call the office to schedule at that time.  Thanks, Rodena Piety

## 2018-02-24 NOTE — Telephone Encounter (Signed)
Unable to schedule HST with Patient at this time

## 2018-11-02 HISTORY — PX: THYROIDECTOMY, PARTIAL: SHX18

## 2019-03-28 DIAGNOSIS — D649 Anemia, unspecified: Secondary | ICD-10-CM | POA: Insufficient documentation

## 2019-03-28 DIAGNOSIS — E8809 Other disorders of plasma-protein metabolism, not elsewhere classified: Secondary | ICD-10-CM | POA: Insufficient documentation

## 2019-05-31 IMAGING — CT CT CHEST W/ CM
2 of 5 series · 11 of 36 positions shown, 13 images · IV contrast (omnipaque)
Comparison: Chest radiographs 07/16/2017. Abdominopelvic CT
07/14/2017 and 04/27/2017.

ADDENDUM:
Results were further discussed by telephone with Dr. Siufun Runas.
The enteric contrast was administered on 07/14/2017 as part of the
small bowel obstruction protocol. As noted, this contrast is now in
the colon. The appendix is visualized on this CT and appears normal
(best seen on coronal images 77 through 87). Current findings still
support a progressive partial distal small bowel obstruction.
CLINICAL DATA: Right hilar mass on radiographs. Bowel obstruction.
History of basal cell carcinoma and hypertension.

EXAM:
CT CHEST, ABDOMEN, AND PELVIS WITH CONTRAST
TECHNIQUE: Multidetector CT imaging of the chest, abdomen and pelvis was
performed following the standard protocol during bolus
administration of intravenous contrast.
CONTRAST:  100mL OMNIPAQUE IOHEXOL 300 MG/ML  SOLN

[Series 3: cap 5.0 i31f 2 · axial · 0.80mm/px · z∈[+668,+1208]mm · 8 of 132 slices shown, 10 images]
[im 12/132  mediastinal]
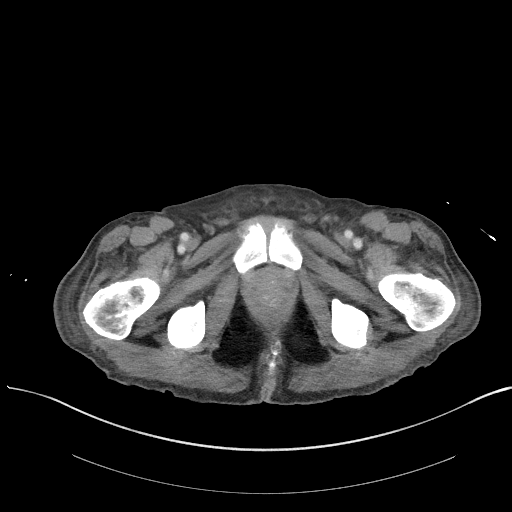
[im 12/132  lung]
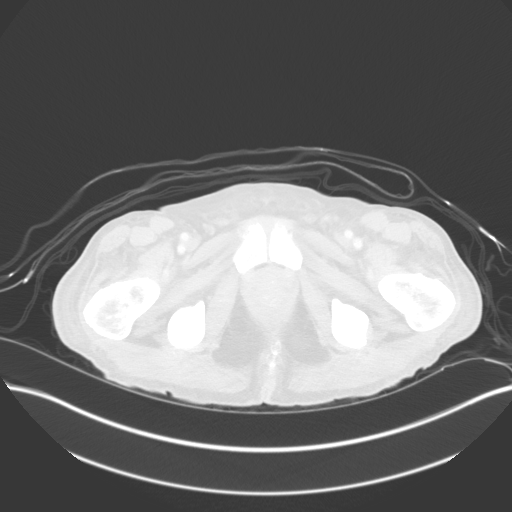
[im 24/132  lung]
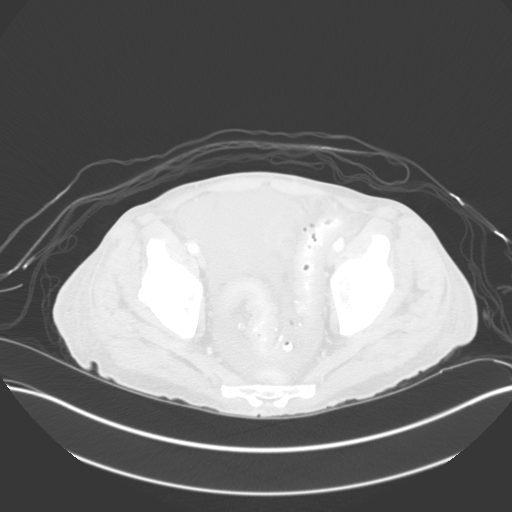
[im 48/132  lung]
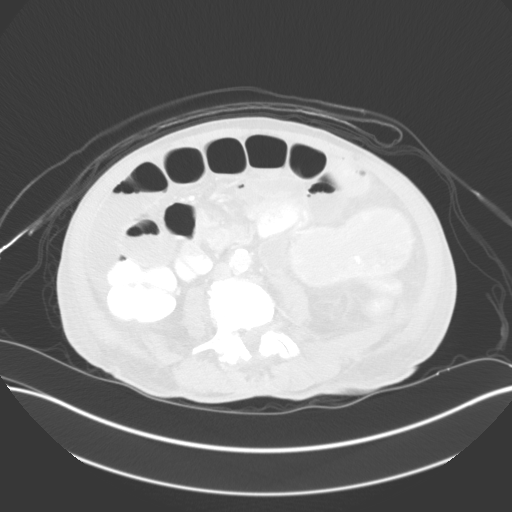
[im 60/132  lung]
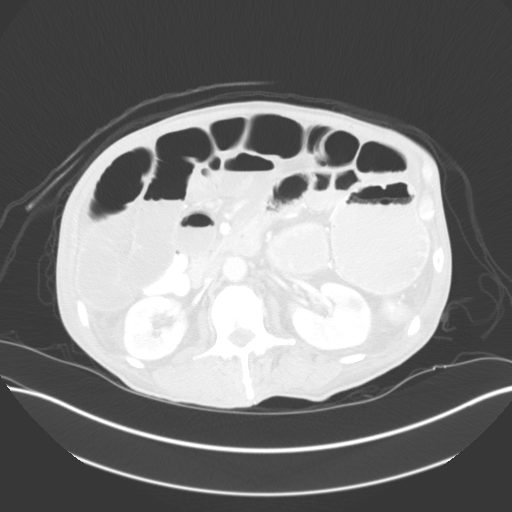
[im 72/132  mediastinal]
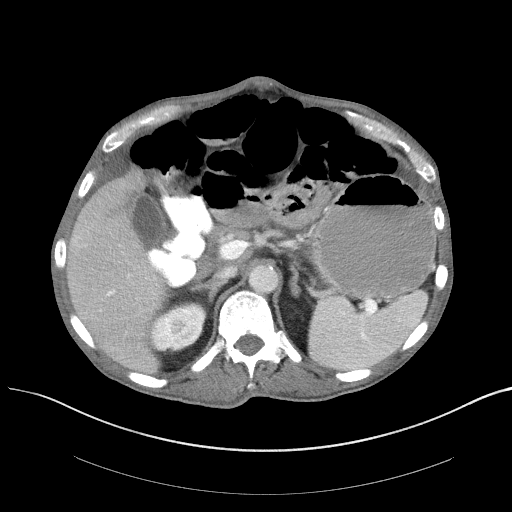
[im 72/132  lung]
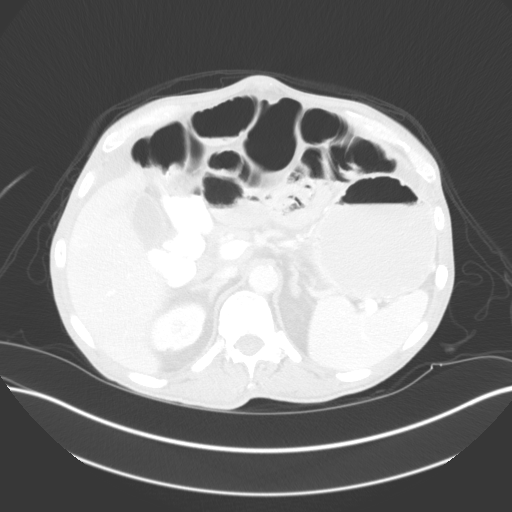
[im 84/132  lung]
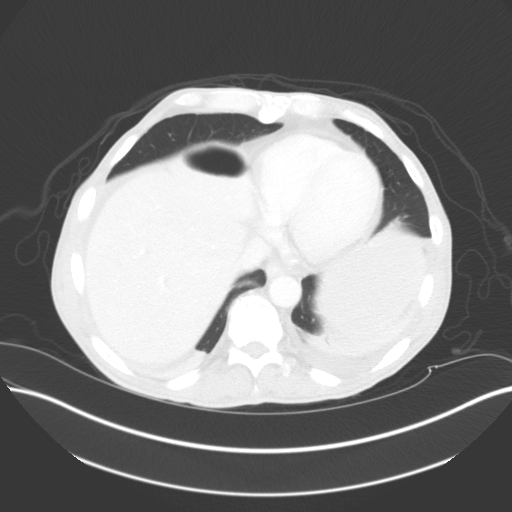
[im 108/132  lung]
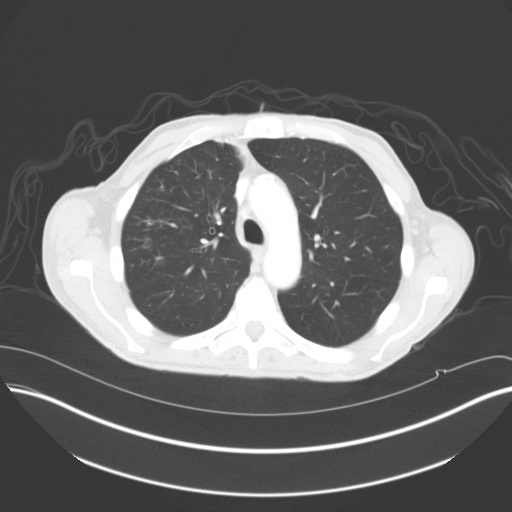
[im 120/132  lung]
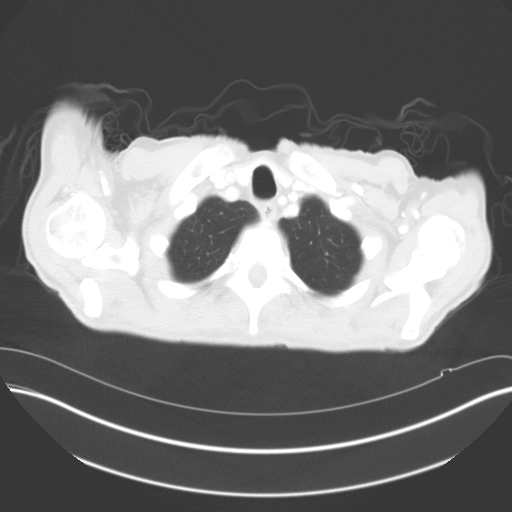

[Series 6: coronal · coronal · 0.75mm/px · 3 of 151 slices shown]
[im 31/151  lung]
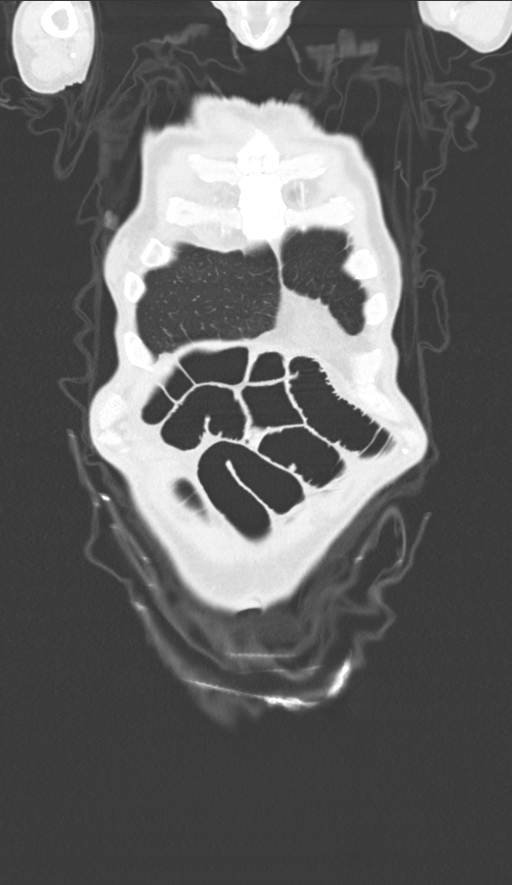
[im 61/151  lung]
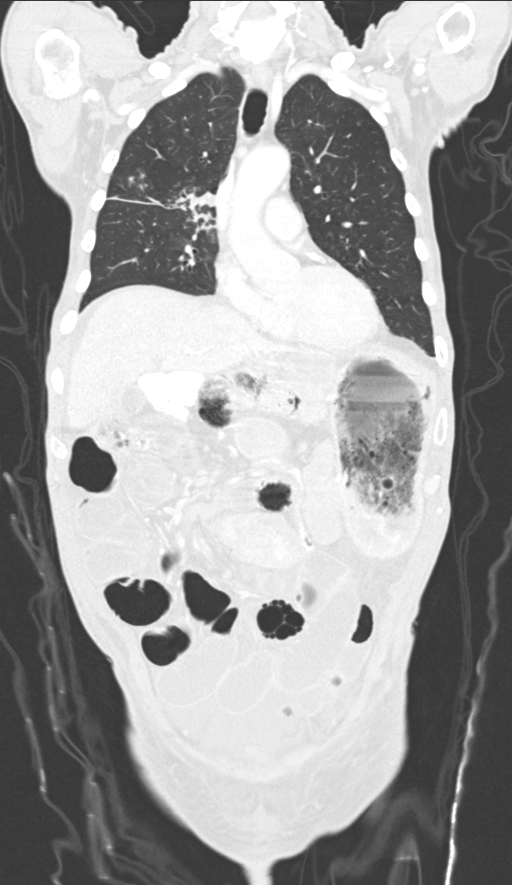
[im 91/151  lung]
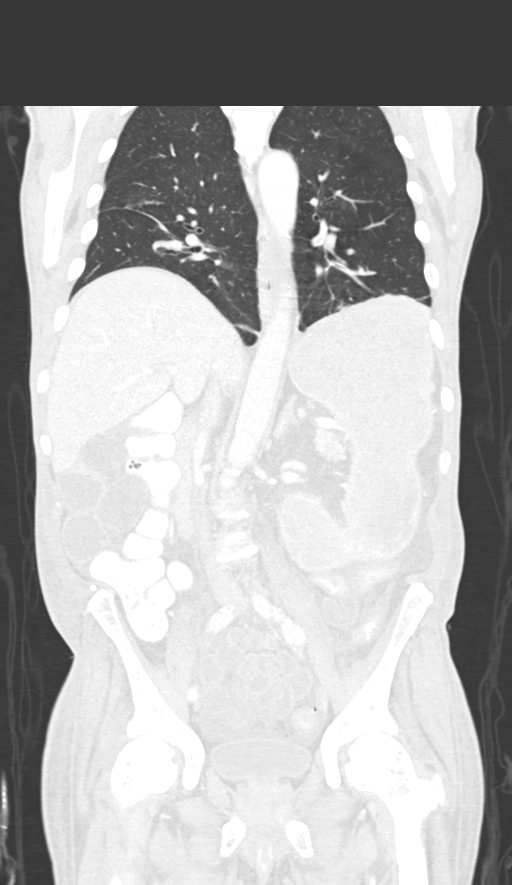

[11 of 36 positions shown; findings below may reference images not displayed]

FINDINGS: CT CHEST FINDINGS

Cardiovascular: There is atherosclerosis of the aorta, great vessels
and coronary arteries. No acute vascular findings are demonstrated.
The heart size is normal. There is no pericardial effusion.

Mediastinum/Nodes: There are no enlarged axillary or mediastinal
lymph nodes. Right hilar soft tissue measuring 2.7 x 1.1 cm on image
[DATE] reflect adenopathy, likely reactive in etiology. Small
right thyroid nodule is of doubtful significance. The trachea and
esophagus appear normal.

Lungs/Pleura: Trace bilateral pleural effusions. There is focal
consolidation in the anterior segment of the right upper lobe which
was not present on the abdominal CT done 04/27/2017. This measures
up to 6.9 x 5.0 cm on image 32/3. There is associated central
low-density and small air bubbles. On the reformatted images, there
is mild inferior bowing of the minor fissure. Surrounding this area
of consolidation, there is patchy airspace disease elsewhere in the
right upper lobe. There is linear atelectasis in both lower lobes.
Mild underlying emphysema noted.

Musculoskeletal/Chest wall: No chest wall mass or suspicious osseous
findings.

CT ABDOMEN AND PELVIS FINDINGS

Hepatobiliary: The liver is normal in density without focal
abnormality. No evidence of gallstones, gallbladder wall thickening
or biliary dilatation.

Pancreas: Unremarkable. No pancreatic ductal dilatation or
surrounding inflammatory changes.

Spleen: Normal in size without focal abnormality.

Adrenals/Urinary Tract: Both adrenal glands appear normal. There are
stable small renal cysts bilaterally. There is cortical scarring in
the lower pole of the left kidney. No evidence of urinary tract
calculus or hydronephrosis. The bladder appears normal.

Stomach/Bowel: There is a small amount of enteric contrast which is
present in the proximal colon. The stomach and small bowel are
fluid-filled and mildly dilated diffusely. This has progressed from
the recent CT. The extreme terminal ileum appears decompressed, best
seen on the coronal images. The cecum is located in the right mid
abdomen. There is no significant colonic distention. There is mild
wall thickening of the sigmoid colon without focal surrounding
inflammation.

Vascular/Lymphatic: There are no enlarged abdominal or pelvic lymph
nodes. There is aortic and branch vessel atherosclerosis. No acute
vascular findings.

Reproductive: The prostate gland and seminal vesicles appear normal.

Other: There is progressive ascites with edema throughout the
mesenteric and subcutaneous fat. No focal extraluminal fluid or air
collections are seen. There is no extravasated enteric contrast.

Musculoskeletal: No acute or significant osseous findings. There is
extensive lumbar spondylosis associated with a convex right
scoliosis.
IMPRESSION: 1. Right upper lobe bacterial pneumonia with possible early lung
abscesses. Given the associated bowing of the minor fissure,
consider Klebsiella pneumonia. Radiographic follow-up recommended to
document clearing and exclude underlying malignancy. Probable
associated reactive right hilar adenopathy.
2. Progressive distal small bowel obstruction. Specific etiology is
not delineated, although based on previous study and location of the
cecum, this could be secondary to an internal hernia or adhesions.
3. Progressive ascites and generalized soft tissue edema. No focal
extraluminal fluid collection.
4. Mild sigmoid colon wall thickening, nonspecific.
5. Aortic Atherosclerosis ([id number].M). No acute vascular findings.

## 2019-06-08 DIAGNOSIS — I10 Essential (primary) hypertension: Secondary | ICD-10-CM | POA: Diagnosis present

## 2020-01-06 ENCOUNTER — Other Ambulatory Visit: Payer: Self-pay

## 2020-01-06 ENCOUNTER — Emergency Department (HOSPITAL_BASED_OUTPATIENT_CLINIC_OR_DEPARTMENT_OTHER): Payer: No Typology Code available for payment source

## 2020-01-06 ENCOUNTER — Inpatient Hospital Stay (HOSPITAL_BASED_OUTPATIENT_CLINIC_OR_DEPARTMENT_OTHER)
Admission: EM | Admit: 2020-01-06 | Discharge: 2020-01-12 | DRG: 389 | Disposition: A | Discharge status: Home / Self Care | Payer: No Typology Code available for payment source | Attending: Physician Assistant | Admitting: Physician Assistant

## 2020-01-06 ENCOUNTER — Encounter (HOSPITAL_BASED_OUTPATIENT_CLINIC_OR_DEPARTMENT_OTHER): Payer: Self-pay

## 2020-01-06 ENCOUNTER — Emergency Department (HOSPITAL_COMMUNITY): Payer: No Typology Code available for payment source

## 2020-01-06 DIAGNOSIS — Z85828 Personal history of other malignant neoplasm of skin: Secondary | ICD-10-CM | POA: Diagnosis not present

## 2020-01-06 DIAGNOSIS — J449 Chronic obstructive pulmonary disease, unspecified: Secondary | ICD-10-CM | POA: Diagnosis present

## 2020-01-06 DIAGNOSIS — Z79899 Other long term (current) drug therapy: Secondary | ICD-10-CM

## 2020-01-06 DIAGNOSIS — I10 Essential (primary) hypertension: Secondary | ICD-10-CM | POA: Diagnosis present

## 2020-01-06 DIAGNOSIS — G4700 Insomnia, unspecified: Secondary | ICD-10-CM | POA: Diagnosis not present

## 2020-01-06 DIAGNOSIS — Z96653 Presence of artificial knee joint, bilateral: Secondary | ICD-10-CM | POA: Diagnosis present

## 2020-01-06 DIAGNOSIS — K56609 Unspecified intestinal obstruction, unspecified as to partial versus complete obstruction: Secondary | ICD-10-CM | POA: Diagnosis present

## 2020-01-06 DIAGNOSIS — E876 Hypokalemia: Secondary | ICD-10-CM | POA: Diagnosis present

## 2020-01-06 DIAGNOSIS — K565 Intestinal adhesions [bands], unspecified as to partial versus complete obstruction: Secondary | ICD-10-CM | POA: Diagnosis not present

## 2020-01-06 DIAGNOSIS — Z0189 Encounter for other specified special examinations: Secondary | ICD-10-CM

## 2020-01-06 DIAGNOSIS — Z4659 Encounter for fitting and adjustment of other gastrointestinal appliance and device: Secondary | ICD-10-CM

## 2020-01-06 DIAGNOSIS — F17210 Nicotine dependence, cigarettes, uncomplicated: Secondary | ICD-10-CM | POA: Diagnosis not present

## 2020-01-06 DIAGNOSIS — F419 Anxiety disorder, unspecified: Secondary | ICD-10-CM | POA: Diagnosis not present

## 2020-01-06 DIAGNOSIS — R64 Cachexia: Secondary | ICD-10-CM | POA: Diagnosis not present

## 2020-01-06 DIAGNOSIS — Z20822 Contact with and (suspected) exposure to covid-19: Secondary | ICD-10-CM | POA: Diagnosis present

## 2020-01-06 DIAGNOSIS — Z8719 Personal history of other diseases of the digestive system: Secondary | ICD-10-CM | POA: Diagnosis not present

## 2020-01-06 DIAGNOSIS — E890 Postprocedural hypothyroidism: Secondary | ICD-10-CM | POA: Diagnosis not present

## 2020-01-06 DIAGNOSIS — Z9049 Acquired absence of other specified parts of digestive tract: Secondary | ICD-10-CM | POA: Diagnosis not present

## 2020-01-06 DIAGNOSIS — L8990 Pressure ulcer of unspecified site, unspecified stage: Secondary | ICD-10-CM | POA: Insufficient documentation

## 2020-01-06 HISTORY — DX: Other specified diseases of biliary tract: K83.8

## 2020-01-06 LAB — CBC
HCT: 45.6 % (ref 39.0–52.0)
Hemoglobin: 15.8 g/dL (ref 13.0–17.0)
MCH: 32.2 pg (ref 26.0–34.0)
MCHC: 34.6 g/dL (ref 30.0–36.0)
MCV: 93.1 fL (ref 80.0–100.0)
Platelets: 254 10*3/uL (ref 150–400)
RBC: 4.9 MIL/uL (ref 4.22–5.81)
RDW: 14.7 % (ref 11.5–15.5)
WBC: 12.5 10*3/uL — ABNORMAL HIGH (ref 4.0–10.5)
nRBC: 0 % (ref 0.0–0.2)

## 2020-01-06 LAB — COMPREHENSIVE METABOLIC PANEL
ALT: 13 U/L (ref 0–44)
AST: 28 U/L (ref 15–41)
Albumin: 3.1 g/dL — ABNORMAL LOW (ref 3.5–5.0)
Alkaline Phosphatase: 55 U/L (ref 38–126)
Anion gap: 14 (ref 5–15)
BUN: 27 mg/dL — ABNORMAL HIGH (ref 8–23)
CO2: 31 mmol/L (ref 22–32)
Calcium: 9 mg/dL (ref 8.9–10.3)
Chloride: 89 mmol/L — ABNORMAL LOW (ref 98–111)
Creatinine, Ser: 1.01 mg/dL (ref 0.61–1.24)
GFR, Estimated: >60 mL/min (ref >60)
Glucose, Bld: 113 mg/dL — ABNORMAL HIGH (ref 70–99)
Potassium: 2.7 mmol/L — CL (ref 3.5–5.1)
Sodium: 134 mmol/L — ABNORMAL LOW (ref 135–145)
Total Bilirubin: 0.8 mg/dL (ref 0.3–1.2)
Total Protein: 6.2 g/dL — ABNORMAL LOW (ref 6.5–8.1)

## 2020-01-06 LAB — URINALYSIS, ROUTINE W REFLEX MICROSCOPIC
Glucose, UA: NEGATIVE mg/dL
Hgb urine dipstick: NEGATIVE
Ketones, ur: NEGATIVE mg/dL
Leukocytes,Ua: NEGATIVE
Nitrite: NEGATIVE
Protein, ur: NEGATIVE mg/dL
Specific Gravity, Urine: 1.015 (ref 1.005–1.030)
pH: 6.5 (ref 5.0–8.0)

## 2020-01-06 LAB — LIPASE, BLOOD: Lipase: 17 U/L (ref 11–51)

## 2020-01-06 LAB — RESP PANEL BY RT-PCR (FLU A&B, COVID) ARPGX2
Influenza A by PCR: NEGATIVE
Influenza B by PCR: NEGATIVE
SARS Coronavirus 2 by RT PCR: NEGATIVE

## 2020-01-06 LAB — MAGNESIUM: Magnesium: 1.7 mg/dL (ref 1.7–2.4)

## 2020-01-06 MED ORDER — POTASSIUM CHLORIDE 10 MEQ/100ML IV SOLN
10.0000 meq | INTRAVENOUS | Status: AC
  Administered 2020-01-06 (×2): 10 meq via INTRAVENOUS
  Filled 2020-01-06 (×2): qty 100

## 2020-01-06 MED ORDER — SODIUM CHLORIDE 0.9 % IV BOLUS
500.0000 mL | Freq: Once | INTRAVENOUS | Status: AC
  Administered 2020-01-06: 500 mL via INTRAVENOUS

## 2020-01-06 MED ORDER — DIATRIZOATE MEGLUMINE & SODIUM 66-10 % PO SOLN
90.0000 mL | Freq: Once | ORAL | Status: AC
  Administered 2020-01-06: 90 mL via NASOGASTRIC
  Filled 2020-01-06: qty 90

## 2020-01-06 MED ORDER — MORPHINE SULFATE (PF) 4 MG/ML IV SOLN: 4.0000 mg | Freq: Once | INTRAVENOUS | Status: DC

## 2020-01-06 MED ORDER — METOPROLOL TARTRATE 5 MG/5ML IV SOLN
5.0000 mg | Freq: Four times a day (QID) | INTRAVENOUS | Status: DC | PRN
  Administered 2020-01-09: 5 mg via INTRAVENOUS
  Filled 2020-01-06: qty 5

## 2020-01-06 MED ORDER — SIMETHICONE 80 MG PO CHEW
40.0000 mg | CHEWABLE_TABLET | Freq: Four times a day (QID) | ORAL | Status: DC | PRN
  Filled 2020-01-06: qty 1

## 2020-01-06 MED ORDER — LIP MEDEX EX OINT
1.0000 "application " | TOPICAL_OINTMENT | Freq: Two times a day (BID) | CUTANEOUS | Status: DC
  Administered 2020-01-06 – 2020-01-12 (×12): 1 via TOPICAL
  Filled 2020-01-06: qty 7

## 2020-01-06 MED ORDER — SODIUM CHLORIDE 0.9 % IV SOLN
25.0000 mg | Freq: Four times a day (QID) | INTRAVENOUS | Status: DC | PRN
  Filled 2020-01-06: qty 1

## 2020-01-06 MED ORDER — MAGNESIUM SULFATE 4 GM/100ML IV SOLN
4.0000 g | Freq: Once | INTRAVENOUS | Status: AC
  Administered 2020-01-06: 4 g via INTRAVENOUS
  Filled 2020-01-06: qty 100

## 2020-01-06 MED ORDER — SODIUM CHLORIDE 0.9 % IV SOLN
8.0000 mg | Freq: Four times a day (QID) | INTRAVENOUS | Status: DC | PRN
  Filled 2020-01-06: qty 4

## 2020-01-06 MED ORDER — ONDANSETRON HCL 4 MG/2ML IJ SOLN: 4.0000 mg | Freq: Four times a day (QID) | INTRAMUSCULAR | Status: DC | PRN

## 2020-01-06 MED ORDER — MORPHINE SULFATE (PF) 2 MG/ML IV SOLN
2.0000 mg | Freq: Once | INTRAVENOUS | Status: AC
  Administered 2020-01-06: 2 mg via INTRAVENOUS
  Filled 2020-01-06: qty 1

## 2020-01-06 MED ORDER — METHOCARBAMOL 1000 MG/10ML IJ SOLN
1000.0000 mg | Freq: Four times a day (QID) | INTRAVENOUS | Status: DC | PRN
  Filled 2020-01-06 (×2): qty 10

## 2020-01-06 MED ORDER — DIPHENHYDRAMINE HCL 50 MG/ML IJ SOLN
12.5000 mg | Freq: Four times a day (QID) | INTRAMUSCULAR | Status: DC | PRN
Start: 2020-01-06 — End: 2020-01-12

## 2020-01-06 MED ORDER — PHENOL 1.4 % MT LIQD
2.0000 | OROMUCOSAL | Status: DC | PRN
  Filled 2020-01-06: qty 177

## 2020-01-06 MED ORDER — LACTATED RINGERS IV BOLUS
1000.0000 mL | Freq: Once | INTRAVENOUS | Status: AC
  Administered 2020-01-06: 1000 mL via INTRAVENOUS

## 2020-01-06 MED ORDER — NICOTINE 14 MG/24HR TD PT24
14.0000 mg | MEDICATED_PATCH | Freq: Once | TRANSDERMAL | Status: DC
  Filled 2020-01-06: qty 1

## 2020-01-06 MED ORDER — POTASSIUM CHLORIDE CRYS ER 20 MEQ PO TBCR
40.0000 meq | EXTENDED_RELEASE_TABLET | Freq: Once | ORAL | Status: AC
  Administered 2020-01-06: 40 meq via ORAL
  Filled 2020-01-06: qty 2

## 2020-01-06 MED ORDER — LACTATED RINGERS IV SOLN: INTRAVENOUS | Status: DC

## 2020-01-06 MED ORDER — DIAZEPAM 5 MG/ML IJ SOLN
5.0000 mg | Freq: Once | INTRAMUSCULAR | Status: AC
  Administered 2020-01-06: 5 mg via INTRAVENOUS
  Filled 2020-01-06: qty 2

## 2020-01-06 MED ORDER — SODIUM CHLORIDE 0.9 % IV SOLN: INTRAVENOUS | Status: DC | PRN

## 2020-01-06 MED ORDER — IOHEXOL 300 MG/ML  SOLN
100.0000 mL | Freq: Once | INTRAMUSCULAR | Status: AC | PRN
  Administered 2020-01-06: 100 mL via INTRAVENOUS

## 2020-01-06 MED ORDER — MENTHOL 3 MG MT LOZG
1.0000 | LOZENGE | OROMUCOSAL | Status: DC | PRN
  Filled 2020-01-06: qty 9

## 2020-01-06 MED ORDER — ENOXAPARIN SODIUM 40 MG/0.4ML ~~LOC~~ SOLN
40.0000 mg | SUBCUTANEOUS | Status: DC
  Administered 2020-01-07 – 2020-01-11 (×6): 40 mg via SUBCUTANEOUS
  Filled 2020-01-06 (×6): qty 0.4

## 2020-01-06 MED ORDER — BISACODYL 10 MG RE SUPP: 10.0000 mg | Freq: Two times a day (BID) | RECTAL | Status: DC | PRN

## 2020-01-06 MED ORDER — MAGIC MOUTHWASH
15.0000 mL | Freq: Four times a day (QID) | ORAL | Status: DC | PRN
  Filled 2020-01-06: qty 15

## 2020-01-06 MED ORDER — LACTATED RINGERS IV BOLUS: 1000.0000 mL | Freq: Three times a day (TID) | INTRAVENOUS | Status: AC | PRN

## 2020-01-06 MED ORDER — FENTANYL CITRATE (PF) 100 MCG/2ML IJ SOLN
25.0000 ug | INTRAMUSCULAR | Status: DC | PRN
  Administered 2020-01-06: 25 ug via INTRAVENOUS
  Administered 2020-01-07 – 2020-01-09 (×9): 50 ug via INTRAVENOUS
  Administered 2020-01-10: 25 ug via INTRAVENOUS
  Administered 2020-01-10 – 2020-01-11 (×8): 50 ug via INTRAVENOUS
  Filled 2020-01-06 (×19): qty 2

## 2020-01-06 MED ORDER — POTASSIUM CHLORIDE 10 MEQ/100ML IV SOLN
10.0000 meq | INTRAVENOUS | Status: AC
  Administered 2020-01-06: 10 meq via INTRAVENOUS
  Filled 2020-01-06: qty 100

## 2020-01-06 MED ORDER — NICOTINE 21 MG/24HR TD PT24
21.0000 mg | MEDICATED_PATCH | Freq: Once | TRANSDERMAL | Status: DC
  Administered 2020-01-06: 21 mg via TRANSDERMAL
  Filled 2020-01-06: qty 1

## 2020-01-06 MED ORDER — SODIUM CHLORIDE 0.9 % IV SOLN: 1000.0000 mL | Freq: Three times a day (TID) | INTRAVENOUS | Status: DC | PRN

## 2020-01-06 NOTE — ED Notes (Signed)
Pt here with complaints of mid abdominal pain for 10 hours, has eased up some now.  Pt has had extensive abdominal surgery in the past.

## 2020-01-06 NOTE — ED Notes (Signed)
ED Provider at bedside. 

## 2020-01-06 NOTE — ED Provider Notes (Signed)
[facility] EMERGENCY DEPARTMENT Provider Note   CSN: [id number] Arrival date & time: 01/06/20  0855     History Chief Complaint  Patient presents with  . Abdominal Pain    Gerald [place] is a 75 y.o. male.  HPI   Patient with several medical history of gangrenous cholecystitis, small bowel obstruction, COPD, hypertension presents to the emerge department chief complaint of acute onset of abdominal pain.  Patient states pain started last night around 9 PM, describes the pain as a sharp throbbing sensation he feels in his epigastric/right upper quadrant and radiates into his back.  He had associated nausea but denies any vomiting.  He states the pain has eased off but continues to have pain.  He endorses some constipation but denies dark tarry stools, blood in stools or urinary symptoms.  He states he has no history of stomach ulcers, does not drink alcohol, does not frequently use NSAIDs but does smoke occasionally.  Patient states he is tolerating p.o. at this time.  He denies any alleviating factors.  Patient denies headaches, fevers, chills, shortness of breath, chest pain, nausea, diarrhea, pedal edema.  Past Medical History:  Diagnosis Date  . Acute cholecystitis 04/27/2017  . Alcohol abuse   . Basal cell carcinoma   . COPD (chronic obstructive pulmonary disease) (HCC)   . Hypertension     Patient Active Problem List   Diagnosis Date Noted  . COPD (chronic obstructive pulmonary disease) (HCC) 10/02/2017  . At risk for sleep apnea 10/02/2017  . Insomnia 10/02/2017  . Anxiety 10/02/2017  . Pneumonia of right upper lobe due to infectious organism 08/13/2017  . Protein-calorie malnutrition, severe 07/23/2017  . Status post surgery 07/18/2017  . Cough   . Abnormal CT of the chest   . Small bowel obstruction (HCC) 07/14/2017  . Abnormal cholangiogram   . Bile duct leak     Past Surgical History:  Procedure Laterality Date  . BASAL CELL CARCINOMA EXCISION    .  CHOLECYSTECTOMY N/A 04/28/2017   Procedure: LAPAROSCOPIC CHOLECYSTECTOMY;  Surgeon: Axel Filler, MD;  Location: [facility] OR;  Service: General;  Laterality: N/A;  . ENDOSCOPIC RETROGRADE CHOLANGIOPANCREATOGRAPHY (ERCP) WITH PROPOFOL N/A 06/26/2017   Procedure: ENDOSCOPIC RETROGRADE CHOLANGIOPANCREATOGRAPHY (ERCP) WITH PROPOFOL;  Surgeon: Iva Boop, MD;  Location: [facility] ENDOSCOPY;  Service: Endoscopy;  Laterality: N/A;  . ERCP N/A 04/30/2017   Procedure: ENDOSCOPIC RETROGRADE CHOLANGIOPANCREATOGRAPHY (ERCP);  Surgeon: Iva Boop, MD;  Location: [facility] ENDOSCOPY;  Service: Endoscopy;  Laterality: N/A;  . LAPAROSCOPY N/A 07/18/2017   Procedure: LAPAROSCOPY DIAGNOSTIC, lysis of adhesions;  Surgeon: Gaynelle Adu, MD;  Location: [facility] OR;  Service: General;  Laterality: N/A;  . LAPAROTOMY  07/18/2017   Procedure: EXPLORATORY LAPAROTOMY, COMPLETION OF CHOLECYSTECTOMY, REPAIR OF SMALL BOWEL CIRRHOSIS, SMALL BOWEL RESECTION.;  Surgeon: Gaynelle Adu, MD;  Location: [facility] OR;  Service: General;;  . LUMBAR LAMINECTOMY    . REPLACEMENT TOTAL KNEE BILATERAL    . STENT REMOVAL  06/26/2017   Procedure: STENT REMOVAL;  Surgeon: Iva Boop, MD;  Location: [facility] ENDOSCOPY;  Service: Endoscopy;;  . THYROIDECTOMY, PARTIAL  11/2018   at [facility]  . VIDEO BRONCHOSCOPY N/A 07/18/2017   Procedure: VIDEO BRONCHOSCOPY;  Surgeon: Leslye Peer, MD;  Location: [facility] OR;  Service: Thoracic;  Laterality: N/A;       Family History  Problem Relation Age of Onset  . Heart disease Mother   . Heart disease Father     Social History   Tobacco  Use  . Smoking status: Current Some Day Smoker    Packs/day: 0.30    Types: Cigarettes  . Smokeless tobacco: Never Used  . Tobacco comment: 2 cigarettes a week-using nicotine patches to help  Vaping Use  . Vaping Use: Never used  Substance Use Topics  . Alcohol use: Not Currently  . Drug use: Not Currently    Home Medications Prior to Admission medications   Medication Sig Start Date  End Date Taking? Authorizing Provider  acetaminophen (TYLENOL) 500 MG tablet He can take 2 tablets every 8 hours as needed for pain.  This should be your first choice for pain medications.  Do not exceed more than 4000 mg of Tylenol per day.  This can harm your liver.  You can buy this at any drugstore over-the-counter. 08/01/17  Yes Sherrie George, PA-C  albuterol (PROVENTIL HFA;VENTOLIN HFA) 108 (90 Base) MCG/ACT inhaler Inhale 2 puffs into the lungs every 6 (six) hours as needed for wheezing or shortness of breath. 10/02/17  Yes Glenford Bayley, NP  amLODipine (NORVASC) 10 MG tablet Take 5 mg by mouth daily.   Yes [provider]  carvedilol (COREG) 25 MG tablet Do not restart this medicine until you have talked with your primary care and they have recommended you resume it.  Your blood pressure has been too low while you were here to be on this. 08/01/17  Yes Sherrie George, PA-C  cloNIDine (CATAPRES) 0.1 MG tablet Take 0.1 mg by mouth 3 (three) times daily.   Yes [provider]  gabapentin (NEURONTIN) 400 MG capsule Take 800 mg by mouth 3 (three) times daily.   Yes [provider]  lisinopril (PRINIVIL,ZESTRIL) 40 MG tablet You are currently taking this as 1 tablet daily;   You were listed as taking this twice a day before admission. 08/01/17  Yes Sherrie George, PA-C  omeprazole (PRILOSEC) 20 MG capsule Take 40 mg by mouth daily.    Yes [provider]  oxyCODONE (OXY IR/ROXICODONE) 5 MG immediate release tablet Take 5 mg by mouth every 4 (four) hours as needed for severe pain (prn).   Yes [provider]  polyethylene glycol (MIRALAX / GLYCOLAX) packet If you have issues with constipation you can use this as the package directions instructed.  You can buy this over-the-counter at any drugstore. 08/01/17  Yes Sherrie George, PA-C  Potassium Chloride ER 20 MEQ TBCR Take 20 mEq by mouth daily. 06/21/17  Yes Iva Boop, MD  tamsulosin (FLOMAX)  0.4 MG CAPS capsule Take 0.4 mg by mouth daily.   Yes [provider]  umeclidinium-vilanterol (ANORO ELLIPTA) 62.5-25 MCG/INH AEPB Inhale 1 puff into the lungs daily. 10/02/17  Yes Glenford Bayley, NP  umeclidinium-vilanterol (ANORO ELLIPTA) 62.5-25 MCG/INH AEPB Inhale 1 puff into the lungs daily. 10/02/17  Yes Glenford Bayley, NP  ENSURE (ENSURE) Take 0.5 Cans by mouth 3 (three) times daily between meals.    [provider]  LORazepam (ATIVAN) 0.5 MG tablet Take 0.5 mg by mouth every 8 (eight) hours.    [provider]  meloxicam (MOBIC) 15 MG tablet Take 15 mg by mouth daily.    [provider]  methocarbamol (ROBAXIN) 750 MG tablet Take 750 mg by mouth 3 (three) times daily as needed for muscle spasms.     [provider]  Multiple Vitamins-Iron (MULTIVITAMINS WITH IRON) TABS tablet He can buy a One-A-Day multiple vitamin with iron and take 1 daily.  Follow package directions.  You can buy this over-the-counter at any drugstore. 08/01/17   Earnstine Regal, PA-C  nicotine (NICODERM CQ - DOSED IN MG/24 HOURS) 14 mg/24hr patch If you need this to stay off your cigarettes you can buy this over-the-counter at any drugstore and follow the package directions. 08/01/17   Earnstine Regal, PA-C  zolpidem (AMBIEN) 5 MG tablet Take 1 tablet (5 mg total) by mouth at bedtime for 15 days. 09/04/17 09/19/17  Juanito Doom, MD    Allergies    Patient has no known allergies.  Review of Systems   Review of Systems  Constitutional: Negative for chills and fever.  HENT: Negative for congestion.   Respiratory: Negative for shortness of breath.   Cardiovascular: Negative for chest pain.  Gastrointestinal: Positive for abdominal pain and nausea. Negative for diarrhea and vomiting.  Genitourinary: Negative for enuresis.  Musculoskeletal: Negative for back pain.  Skin: Negative for rash.  Neurological: Negative for headaches.  Hematological: Does not  bruise/bleed easily.    Physical Exam Updated Vital Signs BP 123/73 (BP Location: Right Arm)   Pulse 66   Temp 97.8 F (36.6 C) (Oral)   Resp 16   Ht 5\' 9"  (1.753 m)   Wt 61.2 kg   SpO2 98%   BMI 19.94 kg/m   Physical Exam Vitals and nursing note reviewed.  Constitutional:      General: He is not in acute distress.    Appearance: He is not ill-appearing.  HENT:     Head: Normocephalic and atraumatic.     Nose: No congestion.  Eyes:     Conjunctiva/sclera: Conjunctivae normal.  Cardiovascular:     Rate and Rhythm: Normal rate and regular rhythm.     Pulses: Normal pulses.     Heart sounds: No murmur heard. No friction rub. No gallop.   Pulmonary:     Effort: No respiratory distress.     Breath sounds: No wheezing, rhonchi or rales.  Abdominal:     Palpations: Abdomen is soft.     Tenderness: There is abdominal tenderness. There is no right CVA tenderness, left CVA tenderness or guarding.     Comments: Patient's abdomen is nondistended, there is noted surgical scars around his umbilicus, he has normoactive bowel sounds, dull to percussion.  Patient was tender to palpation in his epigastric region and right upper quadrant, negative Murphy sign, no rebound tenderness, negative McBurney sign, no peritoneal sign.  Musculoskeletal:     Right lower leg: No edema.     Left lower leg: No edema.     Comments: Patient's move all 4 extremities at difficulty.  Skin:    General: Skin is warm and dry.  Neurological:     Mental Status: He is alert.  Psychiatric:        Mood and Affect: Mood normal.     ED Results / Procedures / Treatments   Labs (all labs ordered are listed, but only abnormal results are displayed) Labs Reviewed  COMPREHENSIVE METABOLIC PANEL - Abnormal; Notable for the following components:      Result Value   Sodium 134 (*)    Potassium 2.7 (*)    Chloride 89 (*)    Glucose, Bld 113 (*)    BUN 27 (*)    Total Protein 6.2 (*)    Albumin 3.1 (*)    All  other components within normal limits  CBC - Abnormal; Notable for the following components:   WBC 12.5 (*)    All other components  within normal limits  URINALYSIS, ROUTINE W REFLEX MICROSCOPIC - Abnormal; Notable for the following components:   Color, Urine ORANGE (*)    Bilirubin Urine LARGE (*)    All other components within normal limits  RESP PANEL BY RT-PCR (FLU A&B, COVID) ARPGX2  LIPASE, BLOOD  MAGNESIUM    EKG EKG Interpretation  Date/Time:  Wednesday January 06 2020 09:43:24 EST Ventricular Rate:  79 PR Interval:  178 QRS Duration: 92 QT Interval:  418 QTC Calculation: 479 R Axis:   -65 Text Interpretation: Normal sinus rhythm Left anterior fascicular block Septal infarct , age undetermined Lateral infarct , age undetermined Abnormal ECG No STEMI Confirmed by Nanda Quinton [phone]) on 01/06/2020 10:56:09 AM   Radiology CT Abdomen Pelvis W Contrast  Result Date: 01/06/2020 CLINICAL DATA:  75 year old male with acute abdominal and pelvic pain. EXAM: CT ABDOMEN AND PELVIS WITH CONTRAST TECHNIQUE: Multidetector CT imaging of the abdomen and pelvis was performed using the standard protocol following bolus administration of intravenous contrast. CONTRAST:  180mL OMNIPAQUE IOHEXOL 300 MG/ML  SOLN COMPARISON:  06/08/2010 FINDINGS: Lower chest: Mild bibasilar atelectasis is identified. Hepatobiliary: Patient is status post cholecystectomy. No acute hepatic abnormalities are noted. Mild intrahepatic biliary fullness again noted without CBD dilatation. Pancreas: Mildly atrophic Spleen: Unremarkable Adrenals/Urinary Tract: The kidneys, adrenal glands and bladder are unremarkable except for a stable LEFT renal cyst. Stomach/Bowel: Dilated stomach and proximal and mid small bowel are noted compatible with high-grade small bowel obstruction. A transition point appears to lie within the RIGHT mid abdomen (series 3: Image 43) without definite obstructing cause noted. There is no evidence of  pneumoperitoneum. A moderate to large amount stool within the colon is present. Colonic diverticulosis is identified. Vascular/Lymphatic: Aortic atherosclerosis. No enlarged abdominal or pelvic lymph nodes. RIGHT common iliac artery is unchanged is unchanged. Reproductive: Mild prostate enlargement again noted. Other: A small to moderate amount of free fluid within abdomen pelvis noted. Musculoskeletal: No acute or suspicious bony abnormalities are noted. Moderate to severe multilevel degenerative disc disease and spondylosis within the lumbar spine again identified. IMPRESSION: 1. High-grade small bowel obstruction with transition point within the RIGHT mid abdomen. No definite obstructing cause identified. Small to moderate amount of free fluid within the abdomen and pelvis. No evidence of pneumoperitoneum. 2. Aortic Atherosclerosis (ICD10-I70.0). Electronically Signed   By: Margarette Canada M.D.   On: 01/06/2020 14:00    Procedures Procedures (including critical care time)  Medications Ordered in ED Medications  nicotine (NICODERM CQ - dosed in mg/24 hours) patch 21 mg (21 mg Transdermal Patch Applied 01/06/20 1313)  0.9 %  sodium chloride infusion ( Intravenous New Bag/Given 01/06/20 1412)  sodium chloride 0.9 % bolus 500 mL (0 mLs Intravenous Stopped 01/06/20 1410)  potassium chloride 10 mEq in 100 mL IVPB (0 mEq Intravenous Stopped 01/06/20 1551)  potassium chloride SA (KLOR-CON) CR tablet 40 mEq (40 mEq Oral Given 01/06/20 1239)  morphine 2 MG/ML injection 2 mg (2 mg Intravenous Given 01/06/20 1237)  iohexol (OMNIPAQUE) 300 MG/ML solution 100 mL (100 mLs Intravenous Contrast Given 01/06/20 1327)  diazepam (VALIUM) injection 5 mg (5 mg Intravenous Given 01/06/20 1513)  morphine 2 MG/ML injection 2 mg (2 mg Intravenous Given 01/06/20 1513)    ED Course  I have reviewed the triage vital signs and the nursing notes.  Pertinent labs & imaging results that were available during my care of the patient were reviewed  by me and considered in my medical decision making (see chart for  details).    MDM Rules/Calculators/A&P                          Patient presents with acute onset of abdominal pain.  He is alert, does not appear in acute distress, vital signs reassuring.  Due to patient's tender abdomen and significant surgical history will obtain CT imaging for further evaluation.  Will obtain basic lab work-up provide patient with fluids and pain meds and reevaluate.  Patient reassessed after providing him with pain meds and fluids, states pain has improved, vital signs remained stable. Due to hypokalemia most likely secondary to nausea and  HCTZ will provide IV potassium and oral potassium.    Patient has high-grade small bowel will make patient n.p.o., insert NG tube and consult with general surgery for further recommendations spoke with Dr. Marlou Starks of general surgery He recommends transferred ED to ED so that he can consult on the patient.  Spoke with Dr. Alvino Chapel at [facility] emergency department he has accepted the patient for transfer, he will consult with Dr. Marlou Starks upon his arrival to determine if patient can be admitted to medicine or admitted to general surgery service  CBC shows slight leukocytosis of 12.5, no signs of anemia.  CMP shows slight hyponeutropenia of 134, hypokalemia of 2.7, slight hyper glycemia of 113, elevated BUN of 27, no elevated liver enzymes, no anion gap present.  Lipase is 17, magnesium 1.7.  CT abdomen pelvis shows high-grade small bowel obstruction with transition point within the right mid abdomen, no definite obstruction cause, there is a small to moderate amount of free fluid within the abdomen no evidence of pneumoperitoneum.  EKG sinus rhythm without signs of ischemia no ST elevation or depression noted.  Low suspicion for systemic infection as patient is nontoxic-appearing, vital signs reassuring, no obvious source infection on my exam.  Patient does have noted elevated  white blood cells but I suspect this is a acute phase reactant due to the nausea and vomiting.  Low suspicion for UTI or pyelonephritis as patient denies urinary symptoms, there is no signs of infection noted on CT scan.  Low suspicion for pneumoperitoneum as abdomen is nondistended, dull to percussion, there is no free air noted within abdomen on CT imaging.  I suspect patient's bowel obstruction is secondary due to his extensive abdominal history.  Will anticipate admission and further observation      Final Clinical Impression(s) / ED Diagnoses Final diagnoses:  Small bowel obstruction ([place])  Hypokalemia    Rx / DC Orders ED Discharge Orders    None       Marcello Fennel, PA-C 01/06/20 1605    Long, Wonda Olds, MD 01/08/20 [phone]

## 2020-01-06 NOTE — ED Provider Notes (Signed)
Patient from [facility] via [facility] for ED to ED transfer to be assessed by general surgery for high grade small bowel obstruction.  Previous provider consulted with Dr. Carolynne Edouard.  He recommended evaluation in the ED to determine if patient can be placed on general surgery team versus medicine team.   Patient assessed.  Has NG tube placed.  He states his pain has improved.  No emesis since NG tube placed.  Will consult general surgery follow-up in [place] patient has arrived  1725: CONSULT with Dr. Michaell Cowing with general surgery. Will evaluate patient for admission.  The patient appears reasonably stabilized for admission considering the current resources, flow, and capabilities available in the ED at this time, and I doubt any other [facility] requiring further screening and/or treatment in the ED prior to admission.   Gerald Kaman A, PA-C 01/06/20 1812    Charlynne Pander, MD 01/07/20 [phone]

## 2020-01-06 NOTE — ED Notes (Signed)
Pt appears very sleepy after IV morphine and valium

## 2020-01-06 NOTE — ED Notes (Signed)
PT BELONGINGS: PT HAS ON ONE PAIR OF PANTS, WITH BELT, HAS PAIR OF GLASSES. HAS 2 PT BELONGING BAGS WITH LABELS. PTS  MOBILE PHONE PLACED IN BLUE BACKPACK AND LABELED AS WELL

## 2020-01-06 NOTE — ED Notes (Signed)
[facility] AT BEDSIDE 

## 2020-01-06 NOTE — ED Triage Notes (Signed)
Pt arrives via [facility] from home with reports of intense abdominal pain. States that 2 years ago he had to have abdominal surgery and this feels similar. Pt states he had part of his colon removed. No colostomy, last BM 2 days ago per patient, some nausea, denies vomiting, denies diarrhea.   EMS reports pain in RLQ, VS - 120/70 93% Ra, history of COPD, 97.8 temp, 70 HR.

## 2020-01-06 NOTE — ED Notes (Signed)
12FR NG TUBE PLACED VIA LEFT NARE, KUB ORDERED PER PROTOCOL FOR PLACEMENT VERIFICATION

## 2020-01-06 NOTE — H&P (Signed)
Gerald Farrell  [birth year] [id number]  CARE TEAM:  PCP: [facility]  Outpatient Care Team: Patient Care Team: [facility] as PCP - General  Inpatient Treatment Team: Treatment Team: Attending Provider: Charlynne Pander, MD; Registered Nurse: Hendricks Limes, RN; Rounding Team: Bishop Limbo, MD; Physician Assistant: Linwood Dibbles, PA-C   This patient is a 75 y.o.male who presents today for surgical evaluation at the request of Latanya Presser.   Chief complaint / Reason for evaluation: Recurrent SBO  Patient with multiple medical problems including hypertension and melanoma.  COPD.  Some cachexia and malnutrition.  Gangrenous cholecystitis status post laparoscopic partial cholecystectomy at [facility] 02 April 2017.  Transferred to [facility] health system.  Developed bile leak with ERCP stenting.  Developed bowel obstruction summer 2019.  Initially seemed to improve but then progressed requiring operative exploration.  Laparoscopic converted to open lysis adhesions with small bowel resection.  Completion cholecystectomy.  Had ileus but eventually resolved.  Bile leak resolved with stents out.  Discharged August 2019.  Patient notes he gets some intermittent abdominal cramping a couple times a month that usually resolves after a few hours.  Has not had to be readmitted for bowel obstructions.  No issues until last night started having severe crampy abdominal pain.  Persistent and worsened.  10 out of 10 for 10 hours.  Unremitting, so he went to the emergency room.  Examination concerning and CT confirming small bowel obstruction.  Transition zone seems to be right midabdomen consistent with prior completion cholecystectomy and bowel resection needed.  Surgical consultation requested.  Placed with nasogastric tube.  Patient transferred [facility] for surgical evaluation.   Assessment  Gerald Farrell  75 y.o. male       Problem List:  Principal  Problem:   Small bowel obstruction (HCC) Active Problems:   COPD (chronic obstructive pulmonary disease) (HCC)   Insomnia   Anxiety   SBO (small bowel obstruction) (HCC)   HTN (hypertension)   Hypokalemia   Hypomagnesemia   SBO most likely due to adhesions  Plan:  Nasogastric tube decompression.  Already got 2 L out and feeling much better.  Small bowel protocol.  Nausea and pain control.  Hypokalemia.  Correct.  Hypomagnesemia.  Corrected.  Suspect persistent malnutrition with cachexia.  Not toxic.  Hopefully this can resolve nonoperatively but he is already required 1 operation for a bowel obstruction in the past.  Most of the source of the abdomen was related to his chronic severe cholecystitis and chronic bile duct leak that is since resolved.  He is not toxic at this time so we will see if we can manage this with nasogastric tube decompression and nonoperative means.  He would like to avoid surgery as well.  -VTE prophylaxis- SCDs, etc -mobilize as tolerated to help recovery  40 minutes spent in review, evaluation, examination, counseling, and coordination of care.  More than 50% of that time was spent in counseling.  Ardeth Sportsman, MD, FACS, MASCRS Gastrointestinal and Minimally Invasive Surgery  [facility] [address], [address] [place], [place] [address] [phone] Fax [phone] Main/Paging  CONTACT INFORMATION: Weekday (9AM-5PM) concerns: Call [facility] main office at [phone] Weeknight (5PM-9AM) or Weekend/Holiday concerns: Check [email or web address] for General Surgery [facility] coverage (Please, do not use SecureChat as it is not reliable communication to operating surgeons for immediate patient care)      01/06/2020      Past Medical History:  Diagnosis Date  . Acute cholecystitis 04/27/2017  . Alcohol abuse   . Basal cell carcinoma   . COPD (chronic obstructive pulmonary disease) ([place])   . Hypertension     Past Surgical  History:  Procedure Laterality Date  . BASAL CELL CARCINOMA EXCISION    . CHOLECYSTECTOMY N/A 04/28/2017   Procedure: LAPAROSCOPIC CHOLECYSTECTOMY;  Surgeon: Ralene Ok, MD;  Location: [place];  Service: General;  Laterality: N/A;  . ENDOSCOPIC RETROGRADE CHOLANGIOPANCREATOGRAPHY (ERCP) WITH PROPOFOL N/A 06/26/2017   Procedure: ENDOSCOPIC RETROGRADE CHOLANGIOPANCREATOGRAPHY (ERCP) WITH PROPOFOL;  Surgeon: Gatha Mayer, MD;  Location: [facility] ENDOSCOPY;  Service: Endoscopy;  Laterality: N/A;  . ERCP N/A 04/30/2017   Procedure: ENDOSCOPIC RETROGRADE CHOLANGIOPANCREATOGRAPHY (ERCP);  Surgeon: Gatha Mayer, MD;  Location: [facility] ENDOSCOPY;  Service: Endoscopy;  Laterality: N/A;  . LAPAROSCOPY N/A 07/18/2017   Procedure: LAPAROSCOPY DIAGNOSTIC, lysis of adhesions;  Surgeon: Greer Pickerel, MD;  Location: [place];  Service: General;  Laterality: N/A;  . LAPAROTOMY  07/18/2017   Procedure: EXPLORATORY LAPAROTOMY, COMPLETION OF CHOLECYSTECTOMY, REPAIR OF SMALL BOWEL CIRRHOSIS, SMALL BOWEL RESECTION.;  Surgeon: Greer Pickerel, MD;  Location: [place];  Service: General;;  . LUMBAR LAMINECTOMY    . REPLACEMENT TOTAL KNEE BILATERAL    . STENT REMOVAL  06/26/2017   Procedure: STENT REMOVAL;  Surgeon: Gatha Mayer, MD;  Location: [facility] ENDOSCOPY;  Service: Endoscopy;;  . THYROIDECTOMY, PARTIAL  11/2018   at [facility]  . VIDEO BRONCHOSCOPY N/A 07/18/2017   Procedure: VIDEO BRONCHOSCOPY;  Surgeon: Collene Gobble, MD;  Location: [facility] OR;  Service: Thoracic;  Laterality: N/A;    Social History   Socioeconomic History  . Marital status: Divorced    Spouse name: Not on file  . Number of children: Not on file  . Years of education: Not on file  . Highest education level: Not on file  Occupational History  . Not on file  Tobacco Use  . Smoking status: Current Some Day Smoker    Packs/day: 0.30    Types: Cigarettes  . Smokeless tobacco: Never Used  . Tobacco comment: 2 cigarettes a week-using nicotine patches to help   Vaping Use  . Vaping Use: Never used  Substance and Sexual Activity  . Alcohol use: Not Currently  . Drug use: Not Currently  . Sexual activity: Not on file  Other Topics Concern  . Not on file  Social History Narrative   Divorced   [place] - goes to [employer]   No alcohol now   Smoker   Not using drugs now   Social Determinants of Radio broadcast assistant Strain: Not on file  Food Insecurity: Not on file  Transportation Needs: Not on file  Physical Activity: Not on file  Stress: Not on file  Social Connections: Not on file  Intimate Partner Violence: Not on file    Family History  Problem Relation Age of Onset  . Heart disease Mother   . Heart disease Father     Current Facility-Administered Medications  Medication Dose Route Frequency Provider Last Rate Last Admin  . 0.9 %  sodium chloride infusion   Intravenous PRN Margette Fast, MD 150 mL/hr at 01/06/20 1412 New Bag at 01/06/20 1412  . bisacodyl (DULCOLAX) suppository 10 mg  10 mg Rectal Q12H PRN Michael Boston, MD      . diatrizoate meglumine-sodium (GASTROGRAFIN) 66-10 % solution 90 mL  90 mL Per NG tube Once Michael Boston, MD      . Leonides Schanz  balm (CARMEX) ointment 1 application  1 application Topical BID Michael Boston, MD      . magic mouthwash  15 mL Oral QID PRN Michael Boston, MD      . nicotine (NICODERM CQ - dosed in mg/24 hours) patch 21 mg  21 mg Transdermal Once Long, Wonda Olds, MD   21 mg at 01/06/20 1313   Current Outpatient Medications  Medication Sig Dispense Refill  . acetaminophen (TYLENOL) 500 MG tablet He can take 2 tablets every 8 hours as needed for pain.  This should be your first choice for pain medications.  Do not exceed more than 4000 mg of Tylenol per day.  This can harm your liver.  You can buy this at any drugstore over-the-counter. 30 tablet 0  . albuterol (PROVENTIL HFA;VENTOLIN HFA) 108 (90 Base) MCG/ACT inhaler Inhale 2 puffs into the lungs every 6 (six) hours as needed for wheezing or  shortness of breath. 1 Inhaler 6  . amLODipine (NORVASC) 10 MG tablet Take 5 mg by mouth daily.    . carvedilol (COREG) 25 MG tablet Do not restart this medicine until you have talked with your primary care and they have recommended you resume it.  Your blood pressure has been too low while you were here to be on this.    . cloNIDine (CATAPRES) 0.1 MG tablet Take 0.1 mg by mouth 3 (three) times daily.    Marland Kitchen gabapentin (NEURONTIN) 400 MG capsule Take 800 mg by mouth 3 (three) times daily.    Marland Kitchen lisinopril (PRINIVIL,ZESTRIL) 40 MG tablet You are currently taking this as 1 tablet daily;   You were listed as taking this twice a day before admission.    Marland Kitchen omeprazole (PRILOSEC) 20 MG capsule Take 40 mg by mouth daily.     Marland Kitchen oxyCODONE (OXY IR/ROXICODONE) 5 MG immediate release tablet Take 5 mg by mouth every 4 (four) hours as needed for severe pain (prn).    . polyethylene glycol (MIRALAX / GLYCOLAX) packet If you have issues with constipation you can use this as the package directions instructed.  You can buy this over-the-counter at any drugstore. 14 each 0  . Potassium Chloride ER 20 MEQ TBCR Take 20 mEq by mouth daily. 90 tablet 1  . tamsulosin (FLOMAX) 0.4 MG CAPS capsule Take 0.4 mg by mouth daily.    Marland Kitchen umeclidinium-vilanterol (ANORO ELLIPTA) 62.5-25 MCG/INH AEPB Inhale 1 puff into the lungs daily. 14 each 6  . umeclidinium-vilanterol (ANORO ELLIPTA) 62.5-25 MCG/INH AEPB Inhale 1 puff into the lungs daily. 1 each 0  . ENSURE (ENSURE) Take 0.5 Cans by mouth 3 (three) times daily between meals.    Marland Kitchen LORazepam (ATIVAN) 0.5 MG tablet Take 0.5 mg by mouth every 8 (eight) hours.    . meloxicam (MOBIC) 15 MG tablet Take 15 mg by mouth daily.    . methocarbamol (ROBAXIN) 750 MG tablet Take 750 mg by mouth 3 (three) times daily as needed for muscle spasms.     . Multiple Vitamins-Iron (MULTIVITAMINS WITH IRON) TABS tablet He can buy a One-A-Day multiple vitamin with iron and take 1 daily.  Follow package  directions.  You can buy this over-the-counter at any drugstore.  0  . nicotine (NICODERM CQ - DOSED IN MG/24 HOURS) 14 mg/24hr patch If you need this to stay off your cigarettes you can buy this over-the-counter at any drugstore and follow the package directions. 28 patch 0  . zolpidem (AMBIEN) 5 MG tablet Take 1 tablet (5  mg total) by mouth at bedtime for 15 days. 15 tablet 0     No Known Allergies  ROS:   All other systems reviewed & are negative except per HPI or as noted below: Constitutional:  No fevers, chills, sweats.  Weight stable Eyes:  No vision changes, No discharge HENT:  No sore throats, nasal drainage Lymph: No neck swelling, No bruising easily Pulmonary:  No cough, productive sputum CV: No orthopnea, PND  Patient walks 15 minutes for about 1/4 miles without difficulty.  No exertional chest/neck/shoulder/arm pain. GI:  No personal nor family history of GI/colon cancer, inflammatory bowel disease, irritable bowel syndrome, allergy such as Celiac Sprue, dietary/dairy problems, colitis, ulcers nor gastritis.  No recent sick contacts/gastroenteritis.  No travel outside the country.  No changes in diet. Renal: No UTIs, No hematuria Genital:  No drainage, bleeding, masses Musculoskeletal: No severe joint pain.  Good ROM major joints Skin:  No sores or lesions.  No rashes Heme/Lymph:  No easy bleeding.  No swollen lymph nodes Neuro: No focal weakness/numbness.  No seizures Psych: No suicidal ideation.  No hallucinations  BP (!) 140/91   Pulse 64   Temp 97.8 F (36.6 C) (Oral)   Resp 14   Ht 5\' 9"  (1.753 m)   Wt 61.2 kg   SpO2 96%   BMI 19.94 kg/m   Physical Exam: Constitutional: Cachectic.  Hygeine adequate.  Vitals signs as above.  Tired but not particularly toxic nor sickly.  Conversational. Eyes: Pupils reactive, normal extraocular movements. Sclera nonicteric Neuro: CN II-XII intact.  No major focal sensory defects.  No major motor deficits. Lymph: No  head/neck/groin lymphadenopathy Psych:  No severe agitation.  No severe anxiety.  Judgment & insight Adequate, Oriented x4, HENT: Normocephalic, Mucus membranes moist.  No thrush.   Neck: Supple, No tracheal deviation.  No obvious thyromegaly Chest: No pain to chest wall compression.  Good respiratory excursion.  No audible wheezing CV:  Pulses intact.   Regular rhythm.  No major extremity edema Abdomen:  Soft.  Moderately distended.  Nontender. No incarcerated hernias.  No hepatomegaly.  No splenomegaly Gen:  No inguinal hernias.  No inguinal lymphadenopathy.   Ext: No obvious deformity or contracture no significant edema.  No cyanosis Skin: No major subcutaneous nodules.  Warm and dry Musculoskeletal: Severe joint rigidity not present.  No obvious clubbing.  No digital petechiae.     Results:   Labs: Results for orders placed or performed during the hospital encounter of 01/06/20 (from the past 48 hour(s))  Lipase, blood     Status: None   Collection Time: 01/06/20  9:50 AM  Result Value Ref Range   Lipase 17 11 - 51 U/L    Comment: Performed at [facility], [address] [place], [place] [address]  Comprehensive metabolic panel     Status: Abnormal   Collection Time: 01/06/20  9:50 AM  Result Value Ref Range   Sodium 134 (L) 135 - 145 mmol/L   Potassium 2.7 (LL) 3.5 - 5.1 mmol/L    Comment: REPEATED TO VERIFY CRITICAL RESULT CALLED TO, READ BACK BY AND VERIFIED WITH: Raynelle Bring, RN AT 1104 BY KBARR    Chloride 89 (L) 98 - 111 mmol/L   CO2 31 22 - 32 mmol/L   Glucose, Bld 113 (H) 70 - 99 mg/dL    Comment: Glucose reference range applies only to samples taken after fasting for at least 8 hours.   BUN 27 (H) 8 -  23 mg/dL   Creatinine, Ser 1.01 0.61 - 1.24 mg/dL   Calcium 9.0 8.9 - 10.3 mg/dL   Total Protein 6.2 (L) 6.5 - 8.1 g/dL   Albumin 3.1 (L) 3.5 - 5.0 g/dL   AST 28 15 - 41 U/L   ALT 13 0 - 44 U/L   Alkaline Phosphatase 55 38 - 126 U/L   Total Bilirubin  0.8 0.3 - 1.2 mg/dL   GFR, Estimated >60 >60 mL/min    Comment: (NOTE) Calculated using the CKD-EPI Creatinine Equation (2021)    Anion gap 14 5 - 15    Comment: Performed at [facility], [address] [place], [place] [address]  CBC     Status: Abnormal   Collection Time: 01/06/20  9:50 AM  Result Value Ref Range   WBC 12.5 (H) 4.0 - 10.5 K/uL   RBC 4.90 4.22 - 5.81 MIL/uL   Hemoglobin 15.8 13.0 - 17.0 g/dL   HCT 45.6 39.0 - 52.0 %   MCV 93.1 80.0 - 100.0 fL   MCH 32.2 26.0 - 34.0 pg   MCHC 34.6 30.0 - 36.0 g/dL   RDW 14.7 11.5 - 15.5 %   Platelets 254 150 - 400 K/uL   nRBC 0.0 0.0 - 0.2 %    Comment: Performed at [facility], [address] [place], [place] [address]  Magnesium     Status: None   Collection Time: 01/06/20  9:50 AM  Result Value Ref Range   Magnesium 1.7 1.7 - 2.4 mg/dL    Comment: Performed at [facility], [address] [place], [place] [address]  Resp Panel by RT-PCR (Flu A&B, Covid) Nasopharyngeal Swab     Status: None   Collection Time: 01/06/20  2:39 PM   Specimen: Nasopharyngeal Swab; Nasopharyngeal(NP) swabs in vial transport medium  Result Value Ref Range   SARS Coronavirus 2 by RT PCR NEGATIVE NEGATIVE    Comment: (NOTE) SARS-CoV-2 target nucleic acids are NOT DETECTED.  The SARS-CoV-2 RNA is generally detectable in upper respiratory specimens during the acute phase of infection. The lowest concentration of SARS-CoV-2 viral copies this assay can detect is 138 copies/mL. A negative result does not preclude SARS-Cov-2 infection and should not be used as the sole basis for treatment or other patient management decisions. A negative result may occur with  improper specimen collection/handling, submission of specimen other than nasopharyngeal swab, presence of viral mutation(s) within the areas targeted by this assay, and inadequate number of viral copies(<138 copies/mL). A negative result must be combined  with clinical observations, patient history, and epidemiological information. The expected result is Negative.  Fact Sheet for Patients:  EntrepreneurPulse.com.au  Fact Sheet for Healthcare Providers:  IncredibleEmployment.be  This test is no t yet approved or cleared by the [place] FDA and  has been authorized for detection and/or diagnosis of SARS-CoV-2 by FDA under an Emergency Use Authorization (EUA). This EUA will remain  in effect (meaning this test can be used) for the duration of the COVID-19 declaration under Section 564(b)(1) of the Act, 21 U.S.C.section 360bbb-3(b)(1), unless the authorization is terminated  or revoked sooner.       Influenza A by PCR NEGATIVE NEGATIVE   Influenza B by PCR NEGATIVE NEGATIVE    Comment: (NOTE) The Xpert Xpress SARS-CoV-2/FLU/RSV plus assay is intended as an aid in the diagnosis of influenza from Nasopharyngeal swab specimens and should not be used as a sole basis for  treatment. Nasal washings and aspirates are unacceptable for Xpert Xpress SARS-CoV-2/FLU/RSV testing.  Fact Sheet for Patients: EntrepreneurPulse.com.au  Fact Sheet for Healthcare Providers: IncredibleEmployment.be  This test is not yet approved or cleared by the [place] FDA and has been authorized for detection and/or diagnosis of SARS-CoV-2 by FDA under an Emergency Use Authorization (EUA). This EUA will remain in effect (meaning this test can be used) for the duration of the COVID-19 declaration under Section 564(b)(1) of the Act, 21 U.S.C. section 360bbb-3(b)(1), unless the authorization is terminated or revoked.  Performed at [facility], [address] [place], [place] [address]   Urinalysis, Routine w reflex microscopic Urine, Clean Catch     Status: Abnormal   Collection Time: 01/06/20  3:37 PM  Result Value Ref Range   Color, Urine ORANGE (A) YELLOW     Comment: BIOCHEMICALS MAY BE AFFECTED BY COLOR   APPearance CLEAR CLEAR   Specific Gravity, Urine 1.015 1.005 - 1.030   pH 6.5 5.0 - 8.0   Glucose, UA NEGATIVE NEGATIVE mg/dL   Hgb urine dipstick NEGATIVE NEGATIVE   Bilirubin Urine LARGE (A) NEGATIVE   Ketones, ur NEGATIVE NEGATIVE mg/dL   Protein, ur NEGATIVE NEGATIVE mg/dL   Nitrite NEGATIVE NEGATIVE   Leukocytes,Ua NEGATIVE NEGATIVE    Comment: Microscopic not done on urines with negative protein, blood, leukocytes, nitrite, or glucose < 500 mg/dL. Performed at [facility], [address] [place], [place] [address]     Imaging / Studies: CT Abdomen Pelvis W Contrast  Result Date: 01/06/2020 CLINICAL DATA:  75 year old male with acute abdominal and pelvic pain. EXAM: CT ABDOMEN AND PELVIS WITH CONTRAST TECHNIQUE: Multidetector CT imaging of the abdomen and pelvis was performed using the standard protocol following bolus administration of intravenous contrast. CONTRAST:  17mL OMNIPAQUE IOHEXOL 300 MG/ML  SOLN COMPARISON:  06/08/2010 FINDINGS: Lower chest: Mild bibasilar atelectasis is identified. Hepatobiliary: Patient is status post cholecystectomy. No acute hepatic abnormalities are noted. Mild intrahepatic biliary fullness again noted without CBD dilatation. Pancreas: Mildly atrophic Spleen: Unremarkable Adrenals/Urinary Tract: The kidneys, adrenal glands and bladder are unremarkable except for a stable LEFT renal cyst. Stomach/Bowel: Dilated stomach and proximal and mid small bowel are noted compatible with high-grade small bowel obstruction. A transition point appears to lie within the RIGHT mid abdomen (series 3: Image 43) without definite obstructing cause noted. There is no evidence of pneumoperitoneum. A moderate to large amount stool within the colon is present. Colonic diverticulosis is identified. Vascular/Lymphatic: Aortic atherosclerosis. No enlarged abdominal or pelvic lymph nodes. RIGHT common iliac artery is  unchanged is unchanged. Reproductive: Mild prostate enlargement again noted. Other: A small to moderate amount of free fluid within abdomen pelvis noted. Musculoskeletal: No acute or suspicious bony abnormalities are noted. Moderate to severe multilevel degenerative disc disease and spondylosis within the lumbar spine again identified. IMPRESSION: 1. High-grade small bowel obstruction with transition point within the RIGHT mid abdomen. No definite obstructing cause identified. Small to moderate amount of free fluid within the abdomen and pelvis. No evidence of pneumoperitoneum. 2. Aortic Atherosclerosis (ICD10-I70.0). Electronically Signed   By: Margarette Canada M.D.   On: 01/06/2020 14:00   DG Abd Portable 1V  Result Date: 01/06/2020 CLINICAL DATA:  Nasogastric tube placement. EXAM: PORTABLE ABDOMEN - 1 VIEW COMPARISON:  July 14, 2017 FINDINGS: A nasogastric tube is seen with its distal tip overlying the body of the stomach. Mild areas of atelectasis and/or infiltrate are seen within the visualized  portions of the bilateral lung bases. The bowel gas pattern is normal. No radio-opaque calculi or other significant radiographic abnormality are seen. Radiopaque surgical clips are seen within the right upper quadrant. IMPRESSION: Nasogastric tube positioning, as described above. Electronically Signed   By: Virgina Norfolk M.D.   On: 01/06/2020 16:24    Medications / Allergies: per chart  Antibiotics: Anti-infectives (From admission, onward)   None        Note: Portions of this report may have been transcribed using voice recognition software. Every effort was made to ensure accuracy; however, inadvertent computerized transcription errors may be present.   Any transcriptional errors that result from this process are unintentional.    Adin Hector, MD, FACS, MASCRS Gastrointestinal and Minimally Invasive Surgery  [facility] [address] [place], [place]  [address] [phone] Fax [phone] Main/Paging  CONTACT INFORMATION: Weekday (9AM-5PM) concerns: Call [facility] main office at [phone] Weeknight (5PM-9AM) or Weekend/Holiday concerns: Check [email or web address] for General Surgery [facility] coverage (Please, do not use SecureChat as it is not reliable communication to operating surgeons for immediate patient care)      01/06/2020  5:29 PM

## 2020-01-07 ENCOUNTER — Inpatient Hospital Stay (HOSPITAL_COMMUNITY): Payer: No Typology Code available for payment source

## 2020-01-07 LAB — CREATININE, SERUM
Creatinine, Ser: 0.85 mg/dL (ref 0.61–1.24)
GFR, Estimated: >60 mL/min (ref >60)

## 2020-01-07 LAB — PHOSPHORUS: Phosphorus: 2.7 mg/dL (ref 2.5–4.6)

## 2020-01-07 LAB — BASIC METABOLIC PANEL
Anion gap: 9 (ref 5–15)
BUN: 20 mg/dL (ref 8–23)
CO2: 27 mmol/L (ref 22–32)
Calcium: 8.4 mg/dL — ABNORMAL LOW (ref 8.9–10.3)
Chloride: 97 mmol/L — ABNORMAL LOW (ref 98–111)
Creatinine, Ser: 0.83 mg/dL (ref 0.61–1.24)
GFR, Estimated: >60 mL/min (ref >60)
Glucose, Bld: 92 mg/dL (ref 70–99)
Potassium: 3.8 mmol/L (ref 3.5–5.1)
Sodium: 133 mmol/L — ABNORMAL LOW (ref 135–145)

## 2020-01-07 LAB — CBC
HCT: 41 % (ref 39.0–52.0)
Hemoglobin: 13.7 g/dL (ref 13.0–17.0)
MCH: 32.2 pg (ref 26.0–34.0)
MCHC: 33.4 g/dL (ref 30.0–36.0)
MCV: 96.5 fL (ref 80.0–100.0)
Platelets: 182 10*3/uL (ref 150–400)
RBC: 4.25 MIL/uL (ref 4.22–5.81)
RDW: 14.5 % (ref 11.5–15.5)
WBC: 11 10*3/uL — ABNORMAL HIGH (ref 4.0–10.5)
nRBC: 0 % (ref 0.0–0.2)

## 2020-01-07 LAB — PREALBUMIN: Prealbumin: 11.9 mg/dL — ABNORMAL LOW (ref 18–38)

## 2020-01-07 MED ORDER — LORAZEPAM 2 MG/ML IJ SOLN
0.5000 mg | Freq: Four times a day (QID) | INTRAMUSCULAR | Status: DC | PRN
  Administered 2020-01-07 – 2020-01-12 (×20): 0.5 mg via INTRAVENOUS
  Filled 2020-01-07 (×21): qty 1

## 2020-01-07 MED ORDER — NICOTINE 14 MG/24HR TD PT24
14.0000 mg | MEDICATED_PATCH | Freq: Every day | TRANSDERMAL | Status: DC
  Administered 2020-01-07 – 2020-01-12 (×6): 14 mg via TRANSDERMAL
  Filled 2020-01-07 (×6): qty 1

## 2020-01-07 MED ORDER — ORAL CARE MOUTH RINSE
15.0000 mL | Freq: Two times a day (BID) | OROMUCOSAL | Status: DC
  Administered 2020-01-08 – 2020-01-11 (×6): 15 mL via OROMUCOSAL

## 2020-01-07 MED ORDER — METHOCARBAMOL 1000 MG/10ML IJ SOLN
500.0000 mg | Freq: Three times a day (TID) | INTRAVENOUS | Status: DC | PRN
  Filled 2020-01-07: qty 5

## 2020-01-07 MED ORDER — NITROGLYCERIN 0.4 MG SL SUBL: 0.4000 mg | SUBLINGUAL_TABLET | SUBLINGUAL | Status: DC | PRN

## 2020-01-07 MED ORDER — BUDESONIDE 0.25 MG/2ML IN SUSP
0.2500 mg | Freq: Two times a day (BID) | RESPIRATORY_TRACT | Status: DC
  Administered 2020-01-07 – 2020-01-12 (×10): 0.25 mg via RESPIRATORY_TRACT
  Filled 2020-01-07 (×10): qty 2

## 2020-01-07 MED ORDER — ARFORMOTEROL TARTRATE 15 MCG/2ML IN NEBU: 15.0000 ug | INHALATION_SOLUTION | Freq: Two times a day (BID) | RESPIRATORY_TRACT | Status: DC

## 2020-01-07 MED ORDER — POTASSIUM CHLORIDE 10 MEQ/100ML IV SOLN
10.0000 meq | INTRAVENOUS | Status: AC
  Administered 2020-01-07 (×3): 10 meq via INTRAVENOUS
  Filled 2020-01-07 (×3): qty 100

## 2020-01-07 MED ORDER — CHLORHEXIDINE GLUCONATE 0.12 % MT SOLN
15.0000 mL | Freq: Two times a day (BID) | OROMUCOSAL | Status: DC
  Administered 2020-01-07 – 2020-01-12 (×11): 15 mL via OROMUCOSAL
  Filled 2020-01-07 (×11): qty 15

## 2020-01-07 MED ORDER — BUDESONIDE 0.25 MG/2ML IN SUSP: 0.2500 mg | Freq: Two times a day (BID) | RESPIRATORY_TRACT | Status: DC

## 2020-01-07 MED ORDER — ARFORMOTEROL TARTRATE 15 MCG/2ML IN NEBU
15.0000 ug | INHALATION_SOLUTION | Freq: Two times a day (BID) | RESPIRATORY_TRACT | Status: DC
  Administered 2020-01-07 – 2020-01-12 (×10): 15 ug via RESPIRATORY_TRACT
  Filled 2020-01-07 (×11): qty 2

## 2020-01-07 MED ORDER — ALBUTEROL SULFATE HFA 108 (90 BASE) MCG/ACT IN AERS
2.0000 | INHALATION_SPRAY | Freq: Four times a day (QID) | RESPIRATORY_TRACT | Status: DC | PRN
  Filled 2020-01-07: qty 6.7

## 2020-01-07 NOTE — Progress Notes (Signed)
NG tube was advanced this am an additional 10 cm per IR recommendation. LIWS was placed on hold and another xray will be obtained to recheck placement.  Once placement is confirmed, LIWS will be resumed. Priscille Kluver

## 2020-01-07 NOTE — Evaluation (Addendum)
Physical Therapy Evaluation Patient Details Name: Gerald Farrell MRN: [id number] DOB: December 14, 1945 Today's Date: 01/07/2020   History of Present Illness  Gerald Farrell is a 75 year old male with medical history of gangrenous cholecystitis s/p laparoscopic chole, small bowel obstruction, COPD, HTN with c/o acute onset of abdominal pain.  Clinical Impression  Pt admitted with above diagnosis. Pt requiring SUPV/min G to steady with OOB mobility. Pt able to take steps in room with RW to steady self, limited due to NG suction at wall. Provided 2 seated BLE strengthening exercises with good performance, pt denies pain with exercise. PTA, pt using RW to mobilize around home and in the community, typically independent with ADLs but living with son who was able to assist when pt fatigued. 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 Pt states his son owns his own business and is able to be home as needed to assist pt as he recovers. Pt currently with functional limitations due to the deficits listed below (see PT Problem List). Pt will benefit from skilled PT to increase their independence and safety with mobility to allow discharge to the venue listed below.       Follow Up Recommendations Outpatient PT;Supervision - Intermittent    Equipment Recommendations  None recommended by PT    Recommendations for Other Services       Precautions / Restrictions Precautions Precautions: Fall Precaution Comments: NG tube Restrictions Weight Bearing Restrictions: No      Mobility  Bed Mobility Overal bed mobility: Needs Assistance Bed Mobility: Supine to Sit  Supine to sit: Supervision    General bed mobility comments: increased time, use of bedrail to upright trunk and scoot out to EOB, cues for hand placement and for IV/NG lines to improve safety    Transfers Overall transfer level: Needs  assistance Equipment used: Rolling walker (2 wheeled) Transfers: Sit to/from Stand Sit to Stand: Min guard    General transfer comment: cued for hand placement on seated surface to power up, slow to rise, slightly posterior weight upon standing that improves with cues  Ambulation/Gait Ambulation/Gait assistance: Min guard Gait Distance (Feet): 3 Feet Assistive device: Rolling walker (2 wheeled) Gait Pattern/deviations: Step-to pattern;Decreased stride length;Trunk flexed    General Gait Details: 4-5 short steps from bed to chair, slightly unsteady requiring assist to steady, no overt LOB or fall, trunk flexed throughout  Stairs            Wheelchair Mobility    Modified Rankin (Stroke Patients Only)       Balance Overall balance assessment: Needs assistance Sitting-balance support: Feet supported;Single extremity supported Sitting balance-Leahy Scale: Fair Sitting balance - Comments: seated EOB   Standing balance support: During functional activity;Bilateral upper extremity supported Standing balance-Leahy Scale: Poor Standing balance comment: reliant on UE support          Pertinent Vitals/Pain Pain Assessment: 0-10 Pain Score: 6  Pain Location: "chronic LBP" Pain Descriptors / Indicators: Sore Pain Intervention(s): Limited activity within patient's tolerance;Monitored during session    Home Living Family/patient expects to be discharged to:: Private residence Living Arrangements: Children (son) Available Help at Discharge: Family;Available 24 hours/day (son owns own business, at home most of the time) Type of Home: House Home Access: Stairs to enter Entrance Stairs-Rails: None (getting one put in) Entrance Stairs-Number of Steps: 2-3 Home Layout: One level Home Equipment: Emergency planning/management officer - 2 wheels;Cane - single point;Other (comment) (3  wheeled walker)      Prior Function Level of Independence: Independent with assistive device(s)          Comments: Pt reports independent with home and community ambulation using RW or 3wheeled walker, holds onto shopping cart in store, drives, independent with ADLs. Son present to assist with dressing as needed.     Hand Dominance   Dominant Hand: Right    Extremity/Trunk Assessment   Upper Extremity Assessment Upper Extremity Assessment: Overall WFL for tasks assessed    Lower Extremity Assessment Lower Extremity Assessment: Overall WFL for tasks assessed (AROM WNL and strength 3+/5 throughout; pt reports decreased sensation throughout anterior lower leg/knee and entire feet)    Cervical / Trunk Assessment Cervical / Trunk Assessment: Kyphotic  Communication   Communication: No difficulties  Cognition Arousal/Alertness: Awake/alert Behavior During Therapy: WFL for tasks assessed/performed Overall Cognitive Status: Within Functional Limits for tasks assessed     General Comments      Exercises General Exercises - Lower Extremity Long Arc Quad: Seated;AROM;Strengthening;Both;10 reps Hip Flexion/Marching: Seated;AROM;Strengthening;Both;10 reps   Assessment/Plan    PT Assessment Patient needs continued PT services  PT Problem List Decreased activity tolerance;Decreased balance;Impaired sensation;Pain       PT Treatment Interventions DME instruction;Gait training;Functional mobility training;Therapeutic activities;Therapeutic exercise;Balance training;Neuromuscular re-education;Patient/family education    PT Goals (Current goals can be found in the Care Plan section)  Acute Rehab PT Goals Patient Stated Goal: home with son, OPPT for back pain and weakness PT Goal Formulation: With patient Time For Goal Achievement: 01/21/20 Potential to Achieve Goals: Good    Frequency Min 3X/week   Barriers to discharge        Co-evaluation               AM-PAC PT "6 Clicks" Mobility  Outcome Measure Help needed turning from your back to your side while in a flat bed  without using bedrails?: None Help needed moving from lying on your back to sitting on the side of a flat bed without using bedrails?: None Help needed moving to and from a bed to a chair (including a wheelchair)?: A Little Help needed standing up from a chair using your arms (e.g., wheelchair or bedside chair)?: A Little Help needed to walk in hospital room?: A Little Help needed climbing 3-5 steps with a railing? : A Little 6 Click Score: 20    End of Session   Activity Tolerance: Patient tolerated treatment well Patient left: in chair;with call bell/phone within reach;with chair alarm set Nurse Communication: Mobility status PT Visit Diagnosis: Unsteadiness on feet (R26.81);Other abnormalities of gait and mobility (R26.89)    Time: [id number] PT Time Calculation (min) (ACUTE ONLY): 28 min   Charges:   PT Evaluation $PT Eval Low Complexity: 1 Low PT Treatments $Therapeutic Exercise: 8-22 mins         Tori Zahirah Cheslock PT, DPT 01/07/20, 3:42 PM

## 2020-01-07 NOTE — Progress Notes (Signed)
Progress Note     Subjective: Patient reports severe anxiety overnight. He reports abdominal pain is improving and that he is passing some flatus. No BM. Has not been out of bed yet.   Objective: Vital signs in last 24 hours: Temp:  [97.4 F (36.3 C)] 97.4 F (36.3 C) (01/06 0604) Pulse Rate:  [56-88] 62 (01/06 0604) Resp:  [0-23] 14 (01/06 0604) BP: (77-168)/(54-134) 135/76 (01/06 0604) SpO2:  [95 %-100 %] 97 % (01/06 0604) Weight:  [62.9 kg] 62.9 kg (01/06 0205) Last BM Date:  (PTA)  Intake/Output from previous day: 01/05 0701 - 01/06 0700 In: 1773.4 [I.V.:676.4; IV Piggyback:1097] Out: 1700 [Urine:700; Emesis/NG output:1000] Intake/Output this shift: No intake/output data recorded.  PE: General: pleasant, WD, cachectic male who is laying in bed in NAD Heart: regular, rate, and rhythm.   Lungs: CTAB, no wheezes, rhonchi, or rales noted.  Respiratory effort nonlabored Abd: soft, NT, ND, +BS, midline surgical scar     Lab Results:  Recent Labs    01/06/20 0950 01/07/20 0802  WBC 12.5* 11.0*  HGB 15.8 13.7  HCT 45.6 41.0  PLT 254 182   BMET Recent Labs    01/06/20 0950 01/07/20 0802  NA 134* 133*  K 2.7* 3.8  CL 89* 97*  CO2 31 27  GLUCOSE 113* 92  BUN 27* 20  CREATININE 1.01 0.83  0.85  CALCIUM 9.0 8.4*   PT/INR No results for input(s): LABPROT, INR in the last 72 hours. CMP     Component Value Date/Time   NA 133 (L) 01/07/2020 0802   K 3.8 01/07/2020 0802   CL 97 (L) 01/07/2020 0802   CO2 27 01/07/2020 0802   GLUCOSE 92 01/07/2020 0802   BUN 20 01/07/2020 0802   CREATININE 0.85 01/07/2020 0802   CREATININE 0.83 01/07/2020 0802   CALCIUM 8.4 (L) 01/07/2020 0802   PROT 6.2 (L) 01/06/2020 0950   ALBUMIN 3.1 (L) 01/06/2020 0950   AST 28 01/06/2020 0950   ALT 13 01/06/2020 0950   ALKPHOS 55 01/06/2020 0950   BILITOT 0.8 01/06/2020 0950   GFRNONAA >60 01/07/2020 0802   GFRNONAA >60 01/07/2020 0802   GFRAA >60 10/18/2017 1937   Lipase      Component Value Date/Time   LIPASE 17 01/06/2020 0950       Studies/Results: CT Abdomen Pelvis W Contrast  Result Date: 01/06/2020 CLINICAL DATA:  75 year old male with acute abdominal and pelvic pain. EXAM: CT ABDOMEN AND PELVIS WITH CONTRAST TECHNIQUE: Multidetector CT imaging of the abdomen and pelvis was performed using the standard protocol following bolus administration of intravenous contrast. CONTRAST:  OMNIPAQUE IOHEXOL 300 MG/ML  SOLN COMPARISON:  06/08/2010 FINDINGS: Lower chest: Mild bibasilar atelectasis is identified. Hepatobiliary: Patient is status post cholecystectomy. No acute hepatic abnormalities are noted. Mild intrahepatic biliary fullness again noted without CBD dilatation. Pancreas: Mildly atrophic Spleen: Unremarkable Adrenals/Urinary Tract: The kidneys, adrenal glands and bladder are unremarkable except for a stable LEFT renal cyst. Stomach/Bowel: Dilated stomach and proximal and mid small bowel are noted compatible with high-grade small bowel obstruction. A transition point appears to lie within the RIGHT mid abdomen (series 3: Image 43) without definite obstructing cause noted. There is no evidence of pneumoperitoneum. A moderate to large amount stool within the colon is present. Colonic diverticulosis is identified. Vascular/Lymphatic: Aortic atherosclerosis. No enlarged abdominal or pelvic lymph nodes. RIGHT common iliac artery is unchanged is unchanged. Reproductive: Mild prostate enlargement again noted. Other: A small to moderate amount  of free fluid within abdomen pelvis noted. Musculoskeletal: No acute or suspicious bony abnormalities are noted. Moderate to severe multilevel degenerative disc disease and spondylosis within the lumbar spine again identified. IMPRESSION: 1. High-grade small bowel obstruction with transition point within the RIGHT mid abdomen. No definite obstructing cause identified. Small to moderate amount of free fluid within the abdomen and  pelvis. No evidence of pneumoperitoneum. 2. Aortic Atherosclerosis (ICD10-I70.0). Electronically Signed   By: Margarette Canada M.D.   On: 01/06/2020 14:00   DG Abd Portable 1V-Small Bowel Obstruction Protocol-initial, 8 hr delay  Result Date: 01/07/2020 CLINICAL DATA:  History of small-bowel obstruction. EXAM: PORTABLE ABDOMEN - 1 VIEW COMPARISON:  Abdomen 01/06/2020.  CT 01/06/2020. FINDINGS: Limited exam due to positioning and technique. NG tube tip noted in the upper most portion of the stomach with side hole just above the gastroesophageal junction. Advancement of approximately 10 cm should be considered. Contrast noted in the stomach. Persistent but improved small-bowel distention. Stool noted in the colon. No free air identified. Surgical clips right upper quadrant. Lumbar spine scoliosis and degenerative change. Atelectasis left lung base scratched it bibasilar atelectasis. IMPRESSION: 1. NG tube tip noted in the upper most portion of the stomach with side hole just above the gastroesophageal junction. Advancement of approximately 10 cm should be considered. Oral contrast noted in the stomach. 2. Persistent but improved small-bowel distention. Electronically Signed   By: Marcello Moores  Register   On: 01/07/2020 05:11   DG Abd Portable 1V-Small Bowel Protocol-Position Verification  Result Date: 01/06/2020 CLINICAL DATA:  Intense abdominal pain EXAM: PORTABLE ABDOMEN - 1 VIEW COMPARISON:  01/06/2020, CT 01/06/2020 FINDINGS: Esophageal tube tip overlies the stomach. Moderate air distension of the bowel, small bowel distension up to 7.1 cm. Gas and stool present in the colon. No radiopaque calculi. IMPRESSION: Moderate air distension of the bowel with distended small bowel up to 7.1 cm presumably corresponding to CT demonstrated bowel obstruction. Electronically Signed   By: Donavan Foil M.D.   On: 01/06/2020 18:39   DG Abd Portable 1V  Result Date: 01/06/2020 CLINICAL DATA:  Nasogastric tube placement. EXAM:  PORTABLE ABDOMEN - 1 VIEW COMPARISON:  July 14, 2017 FINDINGS: A nasogastric tube is seen with its distal tip overlying the body of the stomach. Mild areas of atelectasis and/or infiltrate are seen within the visualized portions of the bilateral lung bases. The bowel gas pattern is normal. No radio-opaque calculi or other significant radiographic abnormality are seen. Radiopaque surgical clips are seen within the right upper quadrant. IMPRESSION: Nasogastric tube positioning, as described above. Electronically Signed   By: Virgina Norfolk M.D.   On: 01/06/2020 16:24    Anti-infectives: Anti-infectives (From admission, onward)   None       Assessment/Plan COPD HTN Hx of alcohol abuse Anxiety - prn ativan while NPO  SBO - hx of multiple prior abdominal surgeries, including for SBO in 2019 - SBO protocol - contrast still in stomach on 8h delay film - continue on LIWS and recheck KUB in AM - needs to mobilize as able today  FEN: NPO, IVF, NGT to LIWS VTE: lovenox ID: no current abx indicated  LOS: 1 day    Norm Parcel , [facility] 01/07/2020, 10:03 AM Please see Amion for pager number during day hours 7:00am-4:30pm

## 2020-01-08 ENCOUNTER — Inpatient Hospital Stay (HOSPITAL_COMMUNITY): Payer: No Typology Code available for payment source

## 2020-01-08 LAB — BASIC METABOLIC PANEL
Anion gap: 8 (ref 5–15)
BUN: 17 mg/dL (ref 8–23)
CO2: 25 mmol/L (ref 22–32)
Calcium: 7.5 mg/dL — ABNORMAL LOW (ref 8.9–10.3)
Chloride: 99 mmol/L (ref 98–111)
Creatinine, Ser: 0.72 mg/dL (ref 0.61–1.24)
GFR, Estimated: >60 mL/min (ref >60)
Glucose, Bld: 64 mg/dL — ABNORMAL LOW (ref 70–99)
Potassium: 3.4 mmol/L — ABNORMAL LOW (ref 3.5–5.1)
Sodium: 132 mmol/L — ABNORMAL LOW (ref 135–145)

## 2020-01-08 LAB — CBC
HCT: 40.3 % (ref 39.0–52.0)
Hemoglobin: 13.5 g/dL (ref 13.0–17.0)
MCH: 31.9 pg (ref 26.0–34.0)
MCHC: 33.5 g/dL (ref 30.0–36.0)
MCV: 95.3 fL (ref 80.0–100.0)
Platelets: 156 10*3/uL (ref 150–400)
RBC: 4.23 MIL/uL (ref 4.22–5.81)
RDW: 14.1 % (ref 11.5–15.5)
WBC: 6.9 10*3/uL (ref 4.0–10.5)
nRBC: 0 % (ref 0.0–0.2)

## 2020-01-08 MED FILL — Fentanyl Citrate Preservative Free (PF) Inj 100 MCG/2ML: INTRAMUSCULAR | Qty: 2 | Status: AC

## 2020-01-08 NOTE — Progress Notes (Signed)
Subjective/Chief Complaint: No complaints   Objective: Vital signs in last 24 hours: Temp:  [97.7 F (36.5 C)-98.4 F (36.9 C)] 97.7 F (36.5 C) (01/07 0455) Pulse Rate:  [58-63] 58 (01/07 0455) Resp:  [14-15] 14 (01/07 0455) BP: (134-163)/(71-92) 134/71 (01/07 0455) SpO2:  [95 %-100 %] 95 % (01/07 0745) Last BM Date: 01/07/20  Intake/Output from previous day: 01/06 0701 - 01/07 0700 In: -  Out: 200 [Urine:200] Intake/Output this shift: No intake/output data recorded.  General appearance: alert and cooperative Resp: clear to auscultation bilaterally Cardio: regular rate and rhythm GI: soft, minimal tenderness  Lab Results:  Recent Labs    01/07/20 0802 01/08/20 0638  WBC 11.0* 6.9  HGB 13.7 13.5  HCT 41.0 40.3  PLT 182 156   BMET Recent Labs    01/07/20 0802 01/08/20 0638  NA 133* 132*  K 3.8 3.4*  CL 97* 99  CO2 27 25  GLUCOSE 92 64*  BUN 20 17  CREATININE 0.83  0.85 0.72  CALCIUM 8.4* 7.5*   PT/INR No results for input(s): LABPROT, INR in the last 72 hours. ABG No results for input(s): PHART, HCO3 in the last 72 hours.  Invalid input(s): PCO2, PO2  Studies/Results: CT Abdomen Pelvis W Contrast  Result Date: 01/06/2020 CLINICAL DATA:  75 year old male with acute abdominal and pelvic pain. EXAM: CT ABDOMEN AND PELVIS WITH CONTRAST TECHNIQUE: Multidetector CT imaging of the abdomen and pelvis was performed using the standard protocol following bolus administration of intravenous contrast. CONTRAST:  153mL OMNIPAQUE IOHEXOL 300 MG/ML  SOLN COMPARISON:  06/08/2010 FINDINGS: Lower chest: Mild bibasilar atelectasis is identified. Hepatobiliary: Patient is status post cholecystectomy. No acute hepatic abnormalities are noted. Mild intrahepatic biliary fullness again noted without CBD dilatation. Pancreas: Mildly atrophic Spleen: Unremarkable Adrenals/Urinary Tract: The kidneys, adrenal glands and bladder are unremarkable except for a stable LEFT renal  cyst. Stomach/Bowel: Dilated stomach and proximal and mid small bowel are noted compatible with high-grade small bowel obstruction. A transition point appears to lie within the RIGHT mid abdomen (series 3: Image 43) without definite obstructing cause noted. There is no evidence of pneumoperitoneum. A moderate to large amount stool within the colon is present. Colonic diverticulosis is identified. Vascular/Lymphatic: Aortic atherosclerosis. No enlarged abdominal or pelvic lymph nodes. RIGHT common iliac artery is unchanged is unchanged. Reproductive: Mild prostate enlargement again noted. Other: A small to moderate amount of free fluid within abdomen pelvis noted. Musculoskeletal: No acute or suspicious bony abnormalities are noted. Moderate to severe multilevel degenerative disc disease and spondylosis within the lumbar spine again identified. IMPRESSION: 1. High-grade small bowel obstruction with transition point within the RIGHT mid abdomen. No definite obstructing cause identified. Small to moderate amount of free fluid within the abdomen and pelvis. No evidence of pneumoperitoneum. 2. Aortic Atherosclerosis (ICD10-I70.0). Electronically Signed   By: Margarette Canada M.D.   On: 01/06/2020 14:00   DG Abd Portable 1V  Result Date: 01/08/2020 CLINICAL DATA:  Small-bowel obstruction EXAM: PORTABLE ABDOMEN - 1 VIEW COMPARISON:  01/07/2020 FINDINGS: Nasogastric tube tip overlies the expected gastric body within the left upper quadrant. Multiple gas-filled dilated loops of small bowel are again seen throughout the mid abdomen. Previously administered oral contrast is now seen throughout the colon in keeping with a a distal, partial small bowel obstruction. No gross free intraperitoneal gas. Cholecystectomy clips seen in the right upper quadrant. Degenerative changes are seen within the lumbar spine with superimposed dextroscoliosis. IMPRESSION: Oral contrast now opacifies the colon in keeping  with a high-grade distal  partial small bowel obstruction. Electronically Signed   By: Fidela Salisbury MD   On: 01/08/2020 05:41   DG Abd Portable 1V-Small Bowel Obstruction Protocol-initial, 8 hr delay  Result Date: 01/07/2020 CLINICAL DATA:  History of small-bowel obstruction. EXAM: PORTABLE ABDOMEN - 1 VIEW COMPARISON:  Abdomen 01/06/2020.  CT 01/06/2020. FINDINGS: Limited exam due to positioning and technique. NG tube tip noted in the upper most portion of the stomach with side hole just above the gastroesophageal junction. Advancement of approximately 10 cm should be considered. Contrast noted in the stomach. Persistent but improved small-bowel distention. Stool noted in the colon. No free air identified. Surgical clips right upper quadrant. Lumbar spine scoliosis and degenerative change. Atelectasis left lung base scratched it bibasilar atelectasis. IMPRESSION: 1. NG tube tip noted in the upper most portion of the stomach with side hole just above the gastroesophageal junction. Advancement of approximately 10 cm should be considered. Oral contrast noted in the stomach. 2. Persistent but improved small-bowel distention. Electronically Signed   By: Marcello Moores  Register   On: 01/07/2020 05:11   DG Abd Portable 1V-Small Bowel Protocol-Position Verification  Result Date: 01/06/2020 CLINICAL DATA:  Intense abdominal pain EXAM: PORTABLE ABDOMEN - 1 VIEW COMPARISON:  01/06/2020, CT 01/06/2020 FINDINGS: Esophageal tube tip overlies the stomach. Moderate air distension of the bowel, small bowel distension up to 7.1 cm. Gas and stool present in the colon. No radiopaque calculi. IMPRESSION: Moderate air distension of the bowel with distended small bowel up to 7.1 cm presumably corresponding to CT demonstrated bowel obstruction. Electronically Signed   By: Donavan Foil M.D.   On: 01/06/2020 18:39   DG Abd Portable 1V  Result Date: 01/06/2020 CLINICAL DATA:  Nasogastric tube placement. EXAM: PORTABLE ABDOMEN - 1 VIEW COMPARISON:  July 14, 2017  FINDINGS: A nasogastric tube is seen with its distal tip overlying the body of the stomach. Mild areas of atelectasis and/or infiltrate are seen within the visualized portions of the bilateral lung bases. The bowel gas pattern is normal. No radio-opaque calculi or other significant radiographic abnormality are seen. Radiopaque surgical clips are seen within the right upper quadrant. IMPRESSION: Nasogastric tube positioning, as described above. Electronically Signed   By: Virgina Norfolk M.D.   On: 01/06/2020 16:24    Anti-infectives: Anti-infectives (From admission, onward)   None      Assessment/Plan: s/p * No surgery found * contrast has reached colon but there are still some dilated loops of small bowel.  Will try clamping ng and allow clears but would go very slow sbo slowly improving  COPD HTN Hx of alcohol abuse Anxiety - prn ativan while NPO  LOS: 2 days    Gerald Farrell 01/08/2020

## 2020-01-09 DIAGNOSIS — L8990 Pressure ulcer of unspecified site, unspecified stage: Secondary | ICD-10-CM | POA: Insufficient documentation

## 2020-01-09 MED ORDER — CLONIDINE HCL 0.2 MG PO TABS
0.3000 mg | ORAL_TABLET | Freq: Every day | ORAL | Status: DC
  Administered 2020-01-09 – 2020-01-11 (×3): 0.3 mg via ORAL
  Filled 2020-01-09 (×3): qty 1

## 2020-01-09 MED ORDER — ASPIRIN EC 81 MG PO TBEC
81.0000 mg | DELAYED_RELEASE_TABLET | Freq: Every day | ORAL | Status: DC
  Administered 2020-01-10 – 2020-01-12 (×3): 81 mg via ORAL
  Filled 2020-01-09 (×3): qty 1

## 2020-01-09 MED ORDER — LISINOPRIL 20 MG PO TABS
40.0000 mg | ORAL_TABLET | Freq: Every day | ORAL | Status: DC
  Administered 2020-01-10 – 2020-01-12 (×3): 40 mg via ORAL
  Filled 2020-01-09 (×3): qty 2

## 2020-01-09 MED ORDER — TAMSULOSIN HCL 0.4 MG PO CAPS
0.4000 mg | ORAL_CAPSULE | Freq: Every day | ORAL | Status: DC
Start: 2020-01-10 — End: 2020-01-12
  Administered 2020-01-10 – 2020-01-12 (×3): 0.4 mg via ORAL
  Filled 2020-01-09 (×3): qty 1

## 2020-01-09 MED ORDER — CARVEDILOL 25 MG PO TABS
25.0000 mg | ORAL_TABLET | Freq: Two times a day (BID) | ORAL | Status: DC
  Administered 2020-01-10 – 2020-01-12 (×5): 25 mg via ORAL
  Filled 2020-01-09 (×5): qty 1

## 2020-01-09 MED ORDER — ENALAPRILAT 1.25 MG/ML IV SOLN
0.6250 mg | Freq: Four times a day (QID) | INTRAVENOUS | Status: DC | PRN
  Administered 2020-01-10: 1.25 mg via INTRAVENOUS
  Filled 2020-01-09 (×3): qty 1

## 2020-01-09 NOTE — Progress Notes (Signed)
   Subjective/Chief Complaint: Patient feels better.  He had 2 bowel movements.  Contrast in colon on x-ray   Objective: Vital signs in last 24 hours: Temp:  [97.2 F (36.2 C)-98.4 F (36.9 C)] 98.3 F (36.8 C) (01/08 0601) Pulse Rate:  [69-76] 70 (01/08 0601) Resp:  [15-20] 15 (01/08 0601) BP: (132-161)/(87-100) 133/87 (01/08 0601) SpO2:  [97 %-100 %] 97 % (01/08 0821) Last BM Date: 01/07/20  Intake/Output from previous day: 01/07 0701 - 01/08 0700 In: -  Out: 2600 [Urine:2600] Intake/Output this shift: No intake/output data recorded.   General: pleasant, WD, cachectic male who is laying in bed in NAD Heart: regular, rate, and rhythm.   Lungs: CTAB, no wheezes, rhonchi, or rales noted.  Respiratory effort nonlabored Abd: soft, NT, ND, +BS, midline surgical scar  Lab Results:  Recent Labs    01/07/20 0802 01/08/20 0638  WBC 11.0* 6.9  HGB 13.7 13.5  HCT 41.0 40.3  PLT 182 156   BMET Recent Labs    01/07/20 0802 01/08/20 0638  NA 133* 132*  K 3.8 3.4*  CL 97* 99  CO2 27 25  GLUCOSE 92 64*  BUN 20 17  CREATININE 0.83  0.85 0.72  CALCIUM 8.4* 7.5*   PT/INR No results for input(s): LABPROT, INR in the last 72 hours. ABG No results for input(s): PHART, HCO3 in the last 72 hours.  Invalid input(s): PCO2, PO2  Studies/Results: DG Abd Portable 1V  Result Date: 01/08/2020 CLINICAL DATA:  Small-bowel obstruction EXAM: PORTABLE ABDOMEN - 1 VIEW COMPARISON:  01/07/2020 FINDINGS: Nasogastric tube tip overlies the expected gastric body within the left upper quadrant. Multiple gas-filled dilated loops of small bowel are again seen throughout the mid abdomen. Previously administered oral contrast is now seen throughout the colon in keeping with a a distal, partial small bowel obstruction. No gross free intraperitoneal gas. Cholecystectomy clips seen in the right upper quadrant. Degenerative changes are seen within the lumbar spine with superimposed dextroscoliosis.  IMPRESSION: Oral contrast now opacifies the colon in keeping with a high-grade distal partial small bowel obstruction. Electronically Signed   By: Fidela Salisbury MD   On: 01/08/2020 05:41    Anti-infectives: Anti-infectives (From admission, onward)   None      Assessment/Plan: COPD HTN Hx of alcohol abuse Anxiety - prn ativan while NPO  SBO - hx of multiple prior abdominal surgeries, including for SBO in 2019 - SBO protocol -contrast in colon.  Advance diet.  NG tube is out.- needs to mobilize as able today  FEN: Clear liquid diet VTE: lovenox ID: no current abx indicated   LOS: 3 days    Gerald Farrell Gerald Farrell 01/09/2020

## 2020-01-10 ENCOUNTER — Inpatient Hospital Stay (HOSPITAL_COMMUNITY): Payer: No Typology Code available for payment source

## 2020-01-10 LAB — BASIC METABOLIC PANEL
Anion gap: 10 (ref 5–15)
BUN: 9 mg/dL (ref 8–23)
CO2: 22 mmol/L (ref 22–32)
Calcium: 7.8 mg/dL — ABNORMAL LOW (ref 8.9–10.3)
Chloride: 96 mmol/L — ABNORMAL LOW (ref 98–111)
Creatinine, Ser: 0.78 mg/dL (ref 0.61–1.24)
GFR, Estimated: >60 mL/min (ref >60)
Glucose, Bld: 121 mg/dL — ABNORMAL HIGH (ref 70–99)
Potassium: 3.6 mmol/L (ref 3.5–5.1)
Sodium: 128 mmol/L — ABNORMAL LOW (ref 135–145)

## 2020-01-10 MED ORDER — HYDROCHLOROTHIAZIDE 25 MG PO TABS
25.0000 mg | ORAL_TABLET | Freq: Every day | ORAL | Status: DC
  Administered 2020-01-10 – 2020-01-12 (×3): 25 mg via ORAL
  Filled 2020-01-10 (×3): qty 1

## 2020-01-10 MED ORDER — HYDRALAZINE HCL 25 MG PO TABS
25.0000 mg | ORAL_TABLET | Freq: Four times a day (QID) | ORAL | Status: DC | PRN
Start: 2020-01-10 — End: 2020-01-12

## 2020-01-10 MED ORDER — POTASSIUM CHLORIDE 20 MEQ PO PACK
40.0000 meq | PACK | Freq: Every day | ORAL | Status: DC
  Administered 2020-01-10 – 2020-01-12 (×3): 40 meq via ORAL
  Filled 2020-01-10 (×3): qty 2

## 2020-01-10 NOTE — Progress Notes (Signed)
Patient [name] is very unsalable during shift, No other symptoms noticed.  Gerald Corpus, NP and Michael Boston, MD was notified. Multiple times BP medication given. Will continue to monitor him.

## 2020-01-10 NOTE — Progress Notes (Signed)
   Subjective/Chief Complaint: Patient having bowel movements.  NG tube clamped on clears and tolerating well.  Blood pressure lability an issue which is his baseline blood pressure problem at home.  Denies headache, chest pain or shortness of breath.   Objective: Vital signs in last 24 hours: Temp:  [98 F (36.7 C)-98.2 F (36.8 C)] 98.2 F (36.8 C) (01/09 0433) Pulse Rate:  [84-110] 93 (01/09 0617) Resp:  [15-18] 17 (01/09 0433) BP: (116-183)/(88-136) 116/88 (01/09 0617) SpO2:  [97 %-100 %] 98 % (01/09 0433) Last BM Date: 01/08/20  Intake/Output from previous day: 01/08 0701 - 01/09 0700 In: 5231.2 [P.O.:118; I.V.:5053.2; NG/GT:60] Out: 500 [Urine:500] Intake/Output this shift: No intake/output data recorded.   General: pleasant, WD,cachecticmale who is laying in bed in NAD Heart: regular, rate, and rhythm.  Lungs: CTAB, no wheezes, rhonchi, or rales noted. Respiratory effort nonlabored Abd: soft, NT, ND, +BS,midline surgical scar Lab Results:  Recent Labs    01/08/20 0638  WBC 6.9  HGB 13.5  HCT 40.3  PLT 156   BMET Recent Labs    01/08/20 0638  NA 132*  K 3.4*  CL 99  CO2 25  GLUCOSE 64*  BUN 17  CREATININE 0.72  CALCIUM 7.5*   PT/INR No results for input(s): LABPROT, INR in the last 72 hours. ABG No results for input(s): PHART, HCO3 in the last 72 hours.  Invalid input(s): PCO2, PO2  Studies/Results: No results found.  Anti-infectives: Anti-infectives (From admission, onward)   None      Assessment/Plan: COPD HTN-labile blood pressure at home.  Resume all home medications.  We will also add a Apresoline to help treat blood pressure for now.  May need medical consultation this week if this does not stabilize once on home BP meds.  Denies headache, chest pain, shortness of breath at this point but will continue to monitor.  I doubt his blood pressure is well controlled at home at this point and that this may be his steady state. Hx of  alcohol abuse Anxiety - prn ativan while NPO  SBO - hx of multiple prior abdominal surgeries, including for SBO in 2019 -Remove NG tube today.  Advance diet  Continue work with PT OT  Goal is for discharge in the next 24 to 48 hours  Check KUB today  FEN:  Full liquid diet VTE: lovenox ID: no current abx indicated   LOS: 4 days    Gerald Farrell Crumpacker 01/10/2020

## 2020-01-11 MED ORDER — ACETAMINOPHEN 325 MG PO TABS: 650.0000 mg | ORAL_TABLET | Freq: Four times a day (QID) | ORAL | Status: DC | PRN

## 2020-01-11 MED ORDER — SODIUM CHLORIDE 0.9 % IV SOLN: INTRAVENOUS | Status: DC | PRN

## 2020-01-11 MED ORDER — OXYCODONE HCL 5 MG PO TABS
5.0000 mg | ORAL_TABLET | Freq: Four times a day (QID) | ORAL | Status: DC | PRN
  Administered 2020-01-11 – 2020-01-12 (×5): 5 mg via ORAL
  Filled 2020-01-11 (×6): qty 1

## 2020-01-11 NOTE — Progress Notes (Signed)
Subjective: CC: Patient NGT clamped. Reports he was able to stay clamped and did not have to return to suction. He is tolerating clears without abdominal pain, n/v. Passing flatus. He notes BM on 1/8 and has a BM recorded for 1/9 on I/O's. Voiding without difficulty. Mobilizing at baseline with walker. Lives at home with his son.   BP improved since adjustments Dr. Brantley Stage made. No HA, CP, or sob.   Objective: Vital signs in last 24 hours: Temp:  [97.5 F (36.4 C)-97.8 F (36.6 C)] 97.6 F (36.4 C) (01/10 0614) Pulse Rate:  [68-85] 68 (01/10 0614) Resp:  [16-18] 16 (01/10 0614) BP: (122-126)/(76-89) 125/76 (01/10 0614) SpO2:  [95 %-100 %] 100 % (01/10 0614) Last BM Date: 01/10/20  Intake/Output from previous day: 01/09 0701 - 01/10 0700 In: -  Out: 450 [Urine:450] Intake/Output this shift: No intake/output data recorded.  PE: Gen:  Alert, NAD, pleasant HEENT: EOM's intact, pupils equal and round. NGT clamped  Card:  RRR Pulm:  CTAB, no W/R/R, effort normal Abd: Soft, NT/ND, +BS Ext:  No LE edema  Psych: A&Ox3  Skin: no rashes noted, warm and dry  Lab Results:  No results for input(s): WBC, HGB, HCT, PLT in the last 72 hours. BMET Recent Labs    01/10/20 0856  NA 128*  K 3.6  CL 96*  CO2 22  GLUCOSE 121*  BUN 9  CREATININE 0.78  CALCIUM 7.8*   PT/INR No results for input(s): LABPROT, INR in the last 72 hours. CMP     Component Value Date/Time   NA 128 (L) 01/10/2020 0856   K 3.6 01/10/2020 0856   CL 96 (L) 01/10/2020 0856   CO2 22 01/10/2020 0856   GLUCOSE 121 (H) 01/10/2020 0856   BUN 9 01/10/2020 0856   CREATININE 0.78 01/10/2020 0856   CALCIUM 7.8 (L) 01/10/2020 0856   PROT 6.2 (L) 01/06/2020 0950   ALBUMIN 3.1 (L) 01/06/2020 0950   AST 28 01/06/2020 0950   ALT 13 01/06/2020 0950   ALKPHOS 55 01/06/2020 0950   BILITOT 0.8 01/06/2020 0950   GFRNONAA >60 01/10/2020 0856   GFRAA >60 10/18/2017 1937   Lipase     Component Value  Date/Time   LIPASE 17 01/06/2020 0950       Studies/Results: DG Abd 1 View  Result Date: 01/10/2020 CLINICAL DATA:  Small-bowel obstruction EXAM: ABDOMEN - 1 VIEW COMPARISON:  January 5-7, 2022 FINDINGS: Air and enteric contrast filled loops of bowel. There is a mildly dilated loop of small bowel in the LEFT upper quadrant, overall decreased in size in comparison to prior on January 5th. There has been progression of enteric contrast to the level of the rectum. Enteric tube tip and side port project over the stomach. Multilevel degenerative changes of the thoracolumbar spine with dextrocurvature of the lumbar spine. Status post cholecystectomy. Incomplete visualization of the lung bases. IMPRESSION: 1. Mild residual dilated loop of small bowel in the LEFT upper quadrant, overall decreased in size in comparison to prior from January 5th. 2. There has been progression of enteric contrast to the level of the rectum. Electronically Signed   By: Valentino Saxon MD   On: 01/10/2020 09:08    Anti-infectives: Anti-infectives (From admission, onward)   None       Assessment/Plan COPD HTN- improved since being restarted on home medications and Hydralazine. Denies headache, chest pain, shortness of breath at this point but will continue to monitor.  Hx  of alcohol abuse Anxiety  SBO - Hx of multiple prior abdominal surgeries, including for SBO in 2019 - Patient with ROBF. Xray with contrast to the level of the rectum on 1/9. D/c NGT and adv to FLD.  - Keep K > 4 and Mg > 2 for bowel function - Mobilize for bowel function  FEN - FLD VTE - SCDs, Loveonx ID - None   LOS: 5 days    Jillyn Ledger , [facility] 01/11/2020, 7:48 AM Please see Amion for pager number during day hours 7:00am-4:30pm

## 2020-01-11 NOTE — Progress Notes (Signed)
Physical Therapy Treatment Patient Details Name: Gerald Farrell MRN: [id number] DOB: February 19, 1945 Today's Date: 01/11/2020    History of Present Illness Gerald Farrell is a 75 year old male with medical history of gangrenous cholecystitis s/p laparoscopic chole, small bowel obstruction, COPD, HTN with c/o acute onset of abdominal pain.    PT Comments    General Comments: AxO x 3 pleasant retired Engineer, manufacturing systems OOB to amb.  General bed mobility comments: increased time, use of bedrail to upright trunk  General transfer comment: cued for hand placement on seated surface to power up, slow to rise, slightly posterior lean but self corrects.  Poor Kyphotic posture  General Gait Details: tolerated an increased distance.  Used walker just for distance.  Pt usually uses a cane.   Follow Up Recommendations  Outpatient PT;Supervision - Intermittent     Equipment Recommendations  None recommended by PT    Recommendations for Other Services       Precautions / Restrictions Precautions Precautions: Fall Precaution Comments: NG tube D/C Restrictions Weight Bearing Restrictions: No    Mobility  Bed Mobility Overal bed mobility: Needs Assistance Bed Mobility: Supine to Sit;Sit to Supine     Supine to sit: Supervision Sit to supine: Supervision   General bed mobility comments: increased time, use of bedrail to upright trunk  Transfers Overall transfer level: Needs assistance Equipment used: Rolling walker (2 wheeled) Transfers: Sit to/from Stand Sit to Stand: Supervision;Min guard         General transfer comment: cued for hand placement on seated surface to power up, slow to rise, slightly posterior lean but self corrects.  Poor Kyphotic posture  Ambulation/Gait Ambulation/Gait assistance: Supervision;Min guard Gait Distance (Feet): 110 Feet Assistive device: Rolling walker (2 wheeled) Gait Pattern/deviations: Step-to pattern;Decreased stride length;Trunk  flexed Gait velocity: decreased   General Gait Details: tolerated an increased distance.  Used walker just for distance.  Pt usually uses a cane.   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        Pertinent Vitals/Pain Pain Assessment: Faces Faces Pain Scale: Hurts a little bit Pain Location: "chronic LBP" Pain Descriptors / Indicators: Aching    Home Living                      Prior Function            PT Goals (current goals can now be found in the care plan section) Progress towards PT goals: Progressing toward goals    Frequency    Min 3X/week      PT Plan Current plan remains appropriate    Co-evaluation  AM-PAC PT "6 Clicks" Mobility   Outcome Measure  Help needed turning from your back to your side while in a flat bed without using bedrails?: None Help needed moving from lying on your back to sitting on the side of a flat bed without using bedrails?: None Help needed moving to and from a bed to a chair (including a wheelchair)?: A Little Help needed standing up from a chair using your arms (e.g., wheelchair or bedside chair)?: A Little Help needed to walk in hospital room?: A Little Help needed climbing 3-5 steps with a railing? : A Little 6 Click Score: 20    End of Session Equipment Utilized During Treatment: Gait belt Activity Tolerance: Patient tolerated treatment well Patient left: in bed;with bed alarm set;with call bell/phone within reach Nurse Communication: Mobility  status PT Visit Diagnosis: Unsteadiness on feet (R26.81);Other abnormalities of gait and mobility (R26.89)     Time: 8828-0034 PT Time Calculation (min) (ACUTE ONLY): 25 min  Charges:  $Gait Training: 8-22 mins $Therapeutic Activity: 8-22 mins                     Rica Koyanagi  PTA Acute  Rehabilitation Services Pager      [phone] Office      [phone]

## 2020-01-12 NOTE — Discharge Instructions (Signed)
Bowel Obstruction A bowel obstruction is a blockage in the small or large bowel. The bowel, which is also called the intestine, is a long, slender tube that connects the stomach to the anus. When a person eats and drinks, food and fluids go from the mouth to the stomach to the small bowel. This is where most of the nutrients in the food and fluids are absorbed. After the small bowel, material passes through the large bowel for further absorption until any leftover material leaves the body as stool through the anus during a bowel movement. A bowel obstruction will prevent food and fluids from passing through the bowel as they normally do during digestion. The bowel can become partially or completely blocked. If this condition is not treated, it can be dangerous because the bowel could rupture. What are the causes? Common causes of this condition include:  Scar tissue (adhesions) from previous surgery or treatment with high-energy X-rays (radiation).  Recent surgery. This may cause the movements of the bowel to slow down and cause food to block the intestine.  Inflammatory bowel disease, such as Crohn's disease or diverticulitis.  Growths or tumors.  A bulging organ (hernia).  Twisting of the bowel (volvulus).  A foreign body.  Slipping of a part of the bowel into another part (intussusception).   What are the signs or symptoms? Symptoms of this condition include:  Pain in the abdomen. Depending on the degree of obstruction, pain may be: ? Mild or severe. ? Dull cramping or sharp pain. ? In one area or in the entire abdomen.  Nausea and vomiting. Vomit may be greenish or a yellow bile color.  Bloating in the abdomen.  Difficulty passing stool (constipation).  Lack of passing gas.  Frequent belching.  Diarrhea. This may occur if the obstruction is partial and runny stool is able to leak around the obstruction. How is this diagnosed? This condition may be diagnosed based on:  A  physical exam.  Medical history.  Imaging tests of the abdomen or pelvis, such as X-ray or CT scan.  Blood or urine tests. How is this treated? Treatment for this condition depends on the cause and severity of the problem. Treatment may include:  Fluids and pain medicines that are given through an IV. Your health care provider may instruct you not to eat or drink if you have nausea or vomiting.  Eating a simple diet. You may be asked to consume a clear liquid diet for several days. This allows the bowel to rest.  Placement of a small tube (nasogastric tube) into the stomach. This will relieve pain, discomfort, and nausea by removing blocked air and fluids from the stomach. It can also help the obstruction clear up faster.  Surgery. This may be required if other treatments do not work. Surgery may be required for: ? Bowel obstruction from a hernia. This can be an emergency procedure. ? Scar tissue that causes frequent or severe obstructions. Follow these instructions at home: Medicines  Take over-the-counter and prescription medicines only as told by your health care provider.  If you were prescribed an antibiotic medicine, take it as told by your health care provider. Do not stop taking the antibiotic even if you start to feel better. General instructions  Follow instructions from your health care provider about eating restrictions. You may need to avoid solid foods and consume only clear liquids until your condition improves.  Return to your normal activities as told by your health care provider. Ask your   health care provider what activities are safe for you.  Avoid sitting for a long time without moving. Get up to take short walks every 1-2 hours. This is important to improve blood flow and breathing. Ask for help if you feel weak or unsteady.  Keep all follow-up visits as told by your health care provider. This is important. How is this prevented? After having a bowel  obstruction, you are more likely to have another. You may do the following things to prevent another obstruction:  If you have a long-term (chronic) disease, pay attention to your symptoms and contact your health care provider if you have questions or concerns.  Avoid becoming constipated. To prevent or treat constipation, your health care provider may recommend that you: ? Drink enough fluid to keep your urine pale yellow. ? Take over-the-counter or prescription medicines. ? Eat foods that are high in fiber, such as beans, whole grains, and fresh fruits and vegetables. ? Limit foods that are high in fat and processed sugars, such as fried or sweet foods.  Stay active. Exercise for 30 minutes or more, 5 or more days each week. Ask your health care provider which exercises are safe for you.  Avoid stress. Find ways to reduce stress, such as meditation, exercise, or taking time for activities that relax you.  Instead of eating three large meals each day, eat three small meals with three small snacks.  Work with a dietitian to make a healthy meal plan that works for you.  Do not use any products that contain nicotine or tobacco, such as cigarettes and e-cigarettes. If you need help quitting, ask your health care provider.   Contact a health care provider if you:  Have a fever.  Have chills. Get help right away if you:  Have increased pain or cramping.  Vomit blood.  Have uncontrolled vomiting or nausea.  Cannot drink fluids because of vomiting or pain.  Become confused.  Begin feeling very thirsty (dehydrated).  Have severe bloating.  Feel extremely weak or you faint. Summary  A bowel obstruction is a blockage in the small or large bowel.  A bowel obstruction will prevent food and fluids from passing through the bowel as they normally do during digestion.  Treatment for this condition depends on the cause and severity of the problem. It may include fluids and pain  medicines through an IV, a simple diet, a nasogastric tube, or surgery.  Follow instructions from your health care provider about eating restrictions. You may need to avoid solid foods and consume only clear liquids until your condition improves. This information is not intended to replace advice given to you by your health care provider. Make sure you discuss any questions you have with your health care provider. Document Revised: 01/24/2018 Document Reviewed: 05/01/2017 Elsevier Patient Education  2021 Elsevier Inc.  

## 2020-01-12 NOTE — Discharge Summary (Signed)
Patient ID: JAMAIRE DECLERCK [id number] May 08, 1945 75 y.o.  Admit date: 01/06/2020 Discharge date: 01/12/2020  Admitting Diagnosis: SBO  Discharge Diagnosis SBO Hx HTN  Consultants None  H&P: Patient with multiple medical problems including hypertension and melanoma.  COPD.  Some cachexia and malnutrition.  Gangrenous cholecystitis status post laparoscopic partial cholecystectomy at [facility] April 2019.  Transferred to [facility] health system.  Developed bile leak with ERCP stenting.  Developed bowel obstruction summer 2019.  Initially seemed to improve but then progressed requiring operative exploration.  Laparoscopic converted to open lysis adhesions with small bowel resection.  Completion cholecystectomy.  Had ileus but eventually resolved.  Bile leak resolved with stents out.  Discharged August 2019.  Patient notes he gets some intermittent abdominal cramping a couple times a month that usually resolves after a few hours.  Has not had to be readmitted for bowel obstructions.  No issues until last night started having severe crampy abdominal pain.  Persistent and worsened.  10 out of 10 for 10 hours.  Unremitting, so he went to the emergency room.  Examination concerning and CT confirming small bowel obstruction.  Transition zone seems to be right midabdomen consistent with prior completion cholecystectomy and bowel resection needed.  Surgical consultation requested.  Placed with nasogastric tube.  Patient transferred [facility] for surgical evaluation.  Procedures None   Hospital Course:  Patient admitted to the general surgery service. He was started on SBO protocol. Contrast reached the colon on xrays. NGT was clamped and patient was started on CLD. Patient had regain of bowel function and tolerated clamping. NGT was removed and diet was advanced. On 1/11 patient was tolerating soft diet, having bowel function (reports BM on 1/10), voiding, mobilizing with PT (written for  outpatient PT - lives at home with his son), and felt stable for discharge home. He was instructed to follow up with his PCP as noted below.    Physical Exam: Gen:  Alert, NAD, pleasant HEENT: EOM's intact, pupils equal and round. NGT clamped  Card:  RRR Pulm:  CTAB, no W/R/R, effort normal Abd: Soft, NT/ND, +BS Ext:  No LE edema  Psych: A&Ox3  Skin: no rashes noted, warm and dry  Allergies as of 01/12/2020      Reactions   Meloxicam Other (See Comments)   unknown   Mirtazapine Other (See Comments)   unknown      Medication List    TAKE these medications   acetaminophen 500 MG tablet Commonly known as: TYLENOL He can take 2 tablets every 8 hours as needed for pain.  This should be your first choice for pain medications.  Do not exceed more than 4000 mg of Tylenol per day.  This can harm your liver.  You can buy this at any drugstore over-the-counter. What changed:   how much to take  how to take this  when to take this  reasons to take this  additional instructions   albuterol 108 (90 Base) MCG/ACT inhaler Commonly known as: VENTOLIN HFA Inhale 2 puffs into the lungs every 6 (six) hours as needed for wheezing or shortness of breath.   aspirin 81 MG EC tablet Take 81 mg by mouth daily.   carvedilol 25 MG tablet Commonly known as: COREG Do not restart this medicine until you have talked with your primary care and they have recommended you resume it.  Your blood pressure has been too low while you were here to be on this. What  changed:   how much to take  how to take this  when to take this  additional instructions   Cholecalciferol 25 MCG (1000 UT) capsule Take 1,000 Units by mouth daily.   cloNIDine 0.2 MG tablet Commonly known as: CATAPRES Take 0.2 mg by mouth See admin instructions. Takes 2 tablets in the morning and 1 tablet at noon.   cloNIDine 0.3 MG tablet Commonly known as: CATAPRES Take 0.3 mg by mouth at bedtime.   gabapentin 400 MG  capsule Commonly known as: NEURONTIN Take 800 mg by mouth 3 (three) times daily.   hydrochlorothiazide 25 MG tablet Commonly known as: HYDRODIURIL Take 25 mg by mouth daily.   lisinopril 40 MG tablet Commonly known as: ZESTRIL You are currently taking this as 1 tablet daily;   You were listed as taking this twice a day before admission. What changed:   how much to take  how to take this  when to take this  additional instructions   methocarbamol 750 MG tablet Commonly known as: ROBAXIN Take 750 mg by mouth 3 (three) times daily as needed for muscle spasms.   mometasone 220 MCG/INH inhaler Commonly known as: ASMANEX Inhale 1 puff into the lungs daily.   multivitamins with iron Tabs tablet He can buy a One-A-Day multiple vitamin with iron and take 1 daily.  Follow package directions.  You can buy this over-the-counter at any drugstore.   nicotine 14 mg/24hr patch Commonly known as: NICODERM CQ - dosed in mg/24 hours If you need this to stay off your cigarettes you can buy this over-the-counter at any drugstore and follow the package directions. What changed:   how much to take  how to take this  when to take this  additional instructions   nitroGLYCERIN 0.4 MG SL tablet Commonly known as: NITROSTAT Place 0.4 mg under the tongue every 5 (five) minutes as needed for chest pain.   Olodaterol HCl 2.5 MCG/ACT Aers Inhale 2 puffs into the lungs daily.   omeprazole 20 MG capsule Commonly known as: PRILOSEC Take 40 mg by mouth daily.   oxyCODONE 5 MG immediate release tablet Commonly known as: Oxy IR/ROXICODONE Take 5 mg by mouth every 4 (four) hours as needed for severe pain (prn).   polyethylene glycol 17 g packet Commonly known as: MIRALAX / GLYCOLAX If you have issues with constipation you can use this as the package directions instructed.  You can buy this over-the-counter at any drugstore. What changed:   how much to take  how to take this  when to  take this  additional instructions   Potassium Chloride ER 20 MEQ Tbcr Take 20 mEq by mouth daily.   rosuvastatin 20 MG tablet Commonly known as: CRESTOR Take 10 mg by mouth at bedtime.   tamsulosin 0.4 MG Caps capsule Commonly known as: FLOMAX Take 0.4 mg by mouth daily.   umeclidinium-vilanterol 62.5-25 MCG/INH Aepb Commonly known as: Anoro Ellipta Inhale 1 puff into the lungs daily.   umeclidinium-vilanterol 62.5-25 MCG/INH Aepb Commonly known as: Anoro Ellipta Inhale 1 puff into the lungs daily.   zolpidem 5 MG tablet Commonly known as: Ambien Take 1 tablet (5 mg total) by mouth at bedtime for 15 days.         Follow-up Information    [facility], [facility] Call.   Why: For follow up of your hospitilization and for your intermittent elevated blood pressure during admission.  Contact information: [address] [place] [address] [phone]  Signed: Alferd Apa, [facility] Surgery 01/12/2020, 9:01 AM Please see Amion for pager number during day hours 7:00am-4:30pm

## 2020-01-12 NOTE — TOC Transition Note (Signed)
Transition of Care [facility]) - CM/SW Discharge Note   Patient Details  Name: Gerald Farrell MRN: [id number] Date of Birth: [birth year]  [facility] CM/SW Contact:  Lynnell Catalan, RN Phone Number: 01/12/2020, 10:49 AM   Clinical Narrative:    [facility] consult for outpatient PT. Pt will need to go through his PCP at the [place] to set up outpatient PT if he would like it at DC. This was placed on AVS. He is typically independent with ADLs and lives with son who is able to assist when pt fatigued.

## 2020-04-05 ENCOUNTER — Other Ambulatory Visit: Payer: Self-pay

## 2020-04-05 ENCOUNTER — Emergency Department (HOSPITAL_COMMUNITY): Payer: No Typology Code available for payment source

## 2020-04-05 ENCOUNTER — Encounter (HOSPITAL_COMMUNITY): Payer: Self-pay | Admitting: *Deleted

## 2020-04-05 ENCOUNTER — Inpatient Hospital Stay (HOSPITAL_COMMUNITY)
Admission: EM | Admit: 2020-04-05 | Discharge: 2020-04-08 | DRG: 388 | Disposition: A | Discharge status: Home / Self Care | Payer: No Typology Code available for payment source | Attending: Internal Medicine | Admitting: Internal Medicine

## 2020-04-05 DIAGNOSIS — Z20822 Contact with and (suspected) exposure to covid-19: Secondary | ICD-10-CM | POA: Diagnosis present

## 2020-04-05 DIAGNOSIS — Z96653 Presence of artificial knee joint, bilateral: Secondary | ICD-10-CM | POA: Diagnosis present

## 2020-04-05 DIAGNOSIS — Z85828 Personal history of other malignant neoplasm of skin: Secondary | ICD-10-CM | POA: Diagnosis not present

## 2020-04-05 DIAGNOSIS — J449 Chronic obstructive pulmonary disease, unspecified: Secondary | ICD-10-CM | POA: Diagnosis present

## 2020-04-05 DIAGNOSIS — F419 Anxiety disorder, unspecified: Secondary | ICD-10-CM | POA: Diagnosis present

## 2020-04-05 DIAGNOSIS — Z681 Body mass index (BMI) 19 or less, adult: Secondary | ICD-10-CM

## 2020-04-05 DIAGNOSIS — Z8249 Family history of ischemic heart disease and other diseases of the circulatory system: Secondary | ICD-10-CM

## 2020-04-05 DIAGNOSIS — E860 Dehydration: Secondary | ICD-10-CM | POA: Diagnosis present

## 2020-04-05 DIAGNOSIS — Z9049 Acquired absence of other specified parts of digestive tract: Secondary | ICD-10-CM | POA: Diagnosis not present

## 2020-04-05 DIAGNOSIS — Z0189 Encounter for other specified special examinations: Secondary | ICD-10-CM

## 2020-04-05 DIAGNOSIS — G8929 Other chronic pain: Secondary | ICD-10-CM | POA: Diagnosis present

## 2020-04-05 DIAGNOSIS — E876 Hypokalemia: Secondary | ICD-10-CM | POA: Diagnosis present

## 2020-04-05 DIAGNOSIS — E890 Postprocedural hypothyroidism: Secondary | ICD-10-CM | POA: Diagnosis present

## 2020-04-05 DIAGNOSIS — I2510 Atherosclerotic heart disease of native coronary artery without angina pectoris: Secondary | ICD-10-CM | POA: Diagnosis present

## 2020-04-05 DIAGNOSIS — E861 Hypovolemia: Secondary | ICD-10-CM | POA: Diagnosis present

## 2020-04-05 DIAGNOSIS — Z7982 Long term (current) use of aspirin: Secondary | ICD-10-CM

## 2020-04-05 DIAGNOSIS — M549 Dorsalgia, unspecified: Secondary | ICD-10-CM | POA: Diagnosis present

## 2020-04-05 DIAGNOSIS — Z955 Presence of coronary angioplasty implant and graft: Secondary | ICD-10-CM

## 2020-04-05 DIAGNOSIS — N179 Acute kidney failure, unspecified: Secondary | ICD-10-CM | POA: Diagnosis present

## 2020-04-05 DIAGNOSIS — E43 Unspecified severe protein-calorie malnutrition: Secondary | ICD-10-CM | POA: Diagnosis present

## 2020-04-05 DIAGNOSIS — F17210 Nicotine dependence, cigarettes, uncomplicated: Secondary | ICD-10-CM | POA: Diagnosis present

## 2020-04-05 DIAGNOSIS — I252 Old myocardial infarction: Secondary | ICD-10-CM

## 2020-04-05 DIAGNOSIS — K56609 Unspecified intestinal obstruction, unspecified as to partial versus complete obstruction: Principal | ICD-10-CM | POA: Diagnosis present

## 2020-04-05 DIAGNOSIS — I10 Essential (primary) hypertension: Secondary | ICD-10-CM | POA: Diagnosis present

## 2020-04-05 DIAGNOSIS — K219 Gastro-esophageal reflux disease without esophagitis: Secondary | ICD-10-CM | POA: Diagnosis present

## 2020-04-05 DIAGNOSIS — Z888 Allergy status to other drugs, medicaments and biological substances status: Secondary | ICD-10-CM

## 2020-04-05 DIAGNOSIS — E871 Hypo-osmolality and hyponatremia: Secondary | ICD-10-CM | POA: Diagnosis present

## 2020-04-05 DIAGNOSIS — Z79899 Other long term (current) drug therapy: Secondary | ICD-10-CM

## 2020-04-05 DIAGNOSIS — R1084 Generalized abdominal pain: Secondary | ICD-10-CM

## 2020-04-05 LAB — RENAL FUNCTION PANEL
Albumin: 2.8 g/dL — ABNORMAL LOW (ref 3.5–5.0)
Anion gap: 6 (ref 5–15)
BUN: 12 mg/dL (ref 8–23)
CO2: 24 mmol/L (ref 22–32)
Calcium: 8.1 mg/dL — ABNORMAL LOW (ref 8.9–10.3)
Chloride: 104 mmol/L (ref 98–111)
Creatinine, Ser: 0.61 mg/dL (ref 0.61–1.24)
GFR, Estimated: >60 mL/min (ref >60)
Glucose, Bld: 81 mg/dL (ref 70–99)
Phosphorus: 3.4 mg/dL (ref 2.5–4.6)
Potassium: 3.2 mmol/L — ABNORMAL LOW (ref 3.5–5.1)
Sodium: 134 mmol/L — ABNORMAL LOW (ref 135–145)

## 2020-04-05 LAB — CBC WITH DIFFERENTIAL/PLATELET
Abs Immature Granulocytes: 0.02 10*3/uL (ref 0.00–0.07)
Basophils Absolute: 0.1 10*3/uL (ref 0.0–0.1)
Basophils Relative: 1 %
Eosinophils Absolute: 0.1 10*3/uL (ref 0.0–0.5)
Eosinophils Relative: 1 %
HCT: 38.4 % — ABNORMAL LOW (ref 39.0–52.0)
Hemoglobin: 12.4 g/dL — ABNORMAL LOW (ref 13.0–17.0)
Immature Granulocytes: 0 %
Lymphocytes Relative: 17 %
Lymphs Abs: 1.1 10*3/uL (ref 0.7–4.0)
MCH: 27.4 pg (ref 26.0–34.0)
MCHC: 32.3 g/dL (ref 30.0–36.0)
MCV: 84.8 fL (ref 80.0–100.0)
Monocytes Absolute: 0.4 10*3/uL (ref 0.1–1.0)
Monocytes Relative: 6 %
Neutro Abs: 4.7 10*3/uL (ref 1.7–7.7)
Neutrophils Relative %: 75 %
Platelets: 189 10*3/uL (ref 150–400)
RBC: 4.53 MIL/uL (ref 4.22–5.81)
RDW: 15.7 % — ABNORMAL HIGH (ref 11.5–15.5)
WBC: 6.3 10*3/uL (ref 4.0–10.5)
nRBC: 0 % (ref 0.0–0.2)

## 2020-04-05 LAB — RESP PANEL BY RT-PCR (FLU A&B, COVID) ARPGX2
Influenza A by PCR: NEGATIVE
Influenza B by PCR: NEGATIVE
SARS Coronavirus 2 by RT PCR: NEGATIVE

## 2020-04-05 LAB — COMPREHENSIVE METABOLIC PANEL
ALT: 14 U/L (ref 0–44)
AST: 28 U/L (ref 15–41)
Albumin: 3.8 g/dL (ref 3.5–5.0)
Alkaline Phosphatase: 114 U/L (ref 38–126)
Anion gap: 12 (ref 5–15)
BUN: 10 mg/dL (ref 8–23)
CO2: 30 mmol/L (ref 22–32)
Calcium: 8.6 mg/dL — ABNORMAL LOW (ref 8.9–10.3)
Chloride: 90 mmol/L — ABNORMAL LOW (ref 98–111)
Creatinine, Ser: 6.66 mg/dL — ABNORMAL HIGH (ref 0.61–1.24)
GFR, Estimated: 8 mL/min — ABNORMAL LOW (ref >60)
Glucose, Bld: 281 mg/dL — ABNORMAL HIGH (ref 70–99)
Potassium: 3.3 mmol/L — ABNORMAL LOW (ref 3.5–5.1)
Sodium: 132 mmol/L — ABNORMAL LOW (ref 135–145)
Total Bilirubin: 1 mg/dL (ref 0.3–1.2)
Total Protein: 7.8 g/dL (ref 6.5–8.1)

## 2020-04-05 LAB — LIPASE, BLOOD: Lipase: 26 U/L (ref 11–51)

## 2020-04-05 LAB — MAGNESIUM: Magnesium: 1.5 mg/dL — ABNORMAL LOW (ref 1.7–2.4)

## 2020-04-05 LAB — LACTIC ACID, PLASMA: Lactic Acid, Venous: 0.7 mmol/L (ref 0.5–1.9)

## 2020-04-05 MED ORDER — POTASSIUM CHLORIDE 10 MEQ/100ML IV SOLN
10.0000 meq | INTRAVENOUS | Status: AC
  Administered 2020-04-06 (×3): 10 meq via INTRAVENOUS
  Filled 2020-04-05 (×2): qty 100

## 2020-04-05 MED ORDER — MOMETASONE FURO-FORMOTEROL FUM 200-5 MCG/ACT IN AERO
2.0000 | INHALATION_SPRAY | Freq: Two times a day (BID) | RESPIRATORY_TRACT | Status: DC
  Administered 2020-04-06 – 2020-04-08 (×5): 2 via RESPIRATORY_TRACT
  Filled 2020-04-05: qty 8.8

## 2020-04-05 MED ORDER — HYDROMORPHONE HCL 1 MG/ML IJ SOLN
1.0000 mg | Freq: Once | INTRAMUSCULAR | Status: AC
  Administered 2020-04-05: 1 mg via INTRAVENOUS
  Filled 2020-04-05: qty 1

## 2020-04-05 MED ORDER — ONDANSETRON HCL 4 MG/2ML IJ SOLN
4.0000 mg | Freq: Once | INTRAMUSCULAR | Status: AC
  Administered 2020-04-05: 4 mg via INTRAVENOUS
  Filled 2020-04-05: qty 2

## 2020-04-05 MED ORDER — HYDROMORPHONE HCL 1 MG/ML IJ SOLN
0.5000 mg | INTRAMUSCULAR | Status: DC | PRN
Start: 2020-04-06 — End: 2020-04-08
  Administered 2020-04-06 – 2020-04-08 (×12): 0.5 mg via INTRAVENOUS
  Filled 2020-04-05 (×12): qty 0.5

## 2020-04-05 MED ORDER — UMECLIDINIUM BROMIDE 62.5 MCG/INH IN AEPB
1.0000 | INHALATION_SPRAY | Freq: Every day | RESPIRATORY_TRACT | Status: DC
  Administered 2020-04-06 – 2020-04-08 (×3): 1 via RESPIRATORY_TRACT
  Filled 2020-04-05: qty 7

## 2020-04-05 MED ORDER — SODIUM CHLORIDE 0.9 % IV BOLUS
1000.0000 mL | Freq: Once | INTRAVENOUS | Status: AC
  Administered 2020-04-05: 1000 mL via INTRAVENOUS

## 2020-04-05 MED ORDER — SODIUM CHLORIDE 0.9 % IV SOLN: INTRAVENOUS | Status: DC

## 2020-04-05 MED ORDER — MORPHINE SULFATE (PF) 4 MG/ML IV SOLN
4.0000 mg | INTRAVENOUS | Status: DC | PRN
  Administered 2020-04-05: 4 mg via INTRAVENOUS
  Filled 2020-04-05: qty 1

## 2020-04-05 MED ORDER — HYDRALAZINE HCL 20 MG/ML IJ SOLN
10.0000 mg | Freq: Four times a day (QID) | INTRAMUSCULAR | Status: DC | PRN
  Administered 2020-04-07: 10 mg via INTRAVENOUS
  Filled 2020-04-05: qty 1

## 2020-04-05 MED ORDER — NICOTINE 7 MG/24HR TD PT24: 7.0000 mg | MEDICATED_PATCH | Freq: Every day | TRANSDERMAL | Status: DC

## 2020-04-05 MED ORDER — LORAZEPAM 2 MG/ML IJ SOLN
1.0000 mg | Freq: Once | INTRAMUSCULAR | Status: AC | PRN
  Administered 2020-04-05: 1 mg via INTRAVENOUS
  Filled 2020-04-05: qty 1

## 2020-04-05 MED ORDER — HYDROMORPHONE HCL 1 MG/ML IJ SOLN
0.5000 mg | INTRAMUSCULAR | Status: DC | PRN
  Administered 2020-04-05: 0.5 mg via INTRAVENOUS
  Filled 2020-04-05: qty 0.5

## 2020-04-05 MED ORDER — METOPROLOL TARTRATE 5 MG/5ML IV SOLN
5.0000 mg | Freq: Three times a day (TID) | INTRAVENOUS | Status: DC
  Administered 2020-04-06: 5 mg via INTRAVENOUS
  Filled 2020-04-05: qty 5

## 2020-04-05 MED ORDER — IPRATROPIUM-ALBUTEROL 0.5-2.5 (3) MG/3ML IN SOLN: 3.0000 mL | RESPIRATORY_TRACT | Status: DC | PRN

## 2020-04-05 MED ORDER — LORAZEPAM 2 MG/ML IJ SOLN
0.5000 mg | Freq: Two times a day (BID) | INTRAMUSCULAR | Status: DC | PRN
  Administered 2020-04-05: 0.5 mg via INTRAVENOUS
  Filled 2020-04-05: qty 1

## 2020-04-05 MED ORDER — DIATRIZOATE MEGLUMINE & SODIUM 66-10 % PO SOLN
90.0000 mL | Freq: Once | ORAL | Status: AC
  Administered 2020-04-05: 90 mL via NASOGASTRIC
  Filled 2020-04-05: qty 90

## 2020-04-05 MED ORDER — PANTOPRAZOLE SODIUM 40 MG IV SOLR
40.0000 mg | INTRAVENOUS | Status: DC
  Administered 2020-04-05: 40 mg via INTRAVENOUS
  Filled 2020-04-05: qty 40

## 2020-04-05 MED ORDER — ONDANSETRON HCL 4 MG/2ML IJ SOLN: 4.0000 mg | Freq: Four times a day (QID) | INTRAMUSCULAR | Status: DC | PRN

## 2020-04-05 MED ORDER — ONDANSETRON HCL 4 MG PO TABS: 4.0000 mg | ORAL_TABLET | Freq: Four times a day (QID) | ORAL | Status: DC | PRN

## 2020-04-05 MED ORDER — NICOTINE 21 MG/24HR TD PT24
21.0000 mg | MEDICATED_PATCH | TRANSDERMAL | Status: DC
  Administered 2020-04-05: 21 mg via TRANSDERMAL
  Filled 2020-04-05: qty 1

## 2020-04-05 MED ORDER — ACETAMINOPHEN 325 MG PO TABS: 650.0000 mg | ORAL_TABLET | Freq: Four times a day (QID) | ORAL | Status: DC | PRN

## 2020-04-05 MED ORDER — POTASSIUM CHLORIDE 10 MEQ/100ML IV SOLN
10.0000 meq | INTRAVENOUS | Status: AC
  Administered 2020-04-05 – 2020-04-06 (×4): 10 meq via INTRAVENOUS
  Filled 2020-04-05 (×2): qty 100

## 2020-04-05 MED ORDER — MORPHINE SULFATE (PF) 2 MG/ML IV SOLN: 1.0000 mg | INTRAVENOUS | Status: DC | PRN

## 2020-04-05 MED ORDER — MAGNESIUM SULFATE 2 GM/50ML IV SOLN
2.0000 g | Freq: Once | INTRAVENOUS | Status: AC
  Administered 2020-04-06: 2 g via INTRAVENOUS
  Filled 2020-04-05: qty 50

## 2020-04-05 MED ORDER — SODIUM CHLORIDE 0.9 % IV BOLUS: 1000.0000 mL | Freq: Once | INTRAVENOUS | Status: DC

## 2020-04-05 MED ORDER — SODIUM CHLORIDE 0.9% FLUSH
3.0000 mL | Freq: Two times a day (BID) | INTRAVENOUS | Status: DC
  Administered 2020-04-06 – 2020-04-07 (×3): 3 mL via INTRAVENOUS

## 2020-04-05 MED ORDER — ACETAMINOPHEN 650 MG RE SUPP: 650.0000 mg | Freq: Four times a day (QID) | RECTAL | Status: DC | PRN

## 2020-04-05 NOTE — ED Notes (Signed)
Fall risk bundle: Yellow socks Yellow fall risk armband Bed alarm on Fall risk sign outside of door

## 2020-04-05 NOTE — ED Notes (Signed)
Failed attempt at NG tube x2. Pt given time to rest.

## 2020-04-05 NOTE — ED Notes (Signed)
Placed external male condom catheter on patient.  

## 2020-04-05 NOTE — ED Provider Notes (Signed)
[facility] DEPT Provider Note   CSN: [id number] Arrival date & time: 04/05/20  9379     History Chief Complaint  Patient presents with  . Abdominal Pain    Gerald Farrell is a 75 y.o. male.  HPI Elderly male with history of prior acute cholecystitis, prior bowel obstruction now presents with abdominal pain, nausea, change in bowel movements.  Onset was yesterday about 10 hours prior to ED arrival.  Since that time he has had severe pain in the periumbilical area.  There is associated anorexia, nausea.  He seemingly had some mild symptoms prior to this was taking laxatives, but had only mild stool production.  He states that he has been unable to have a normal bowel movement. No fever, no chest pain, no dyspnea.    Past Medical History:  Diagnosis Date  . Acute cholecystitis 04/27/2017  . Alcohol abuse   . Basal cell carcinoma   . Bile duct leak   . COPD (chronic obstructive pulmonary disease) ([place])   . Hypertension   . Pneumonia of right upper lobe due to infectious organism 08/13/2017    Patient Active Problem List   Diagnosis Date Noted  . Pressure injury of skin 01/09/2020  . SBO (small bowel obstruction) ([place]) 01/06/2020  . Hypomagnesemia 01/06/2020  . HTN (hypertension) 06/08/2019  . Hypoalbuminemia 03/28/2019  . Normocytic anemia 03/28/2019  . Intervertebral disc disorder with radiculopathy of lumbar region 02/03/2018  . Lumbar spondylolysis 02/03/2018  . COPD (chronic obstructive pulmonary disease) ([place]) 10/02/2017  . At risk for sleep apnea 10/02/2017  . Insomnia 10/02/2017  . Anxiety 10/02/2017  . Protein-calorie malnutrition, severe 07/23/2017  . Cough   . Abnormal CT of the chest   . Small bowel obstruction ([place]) 07/14/2017  . Hypernatremia 03/07/2015  . Hypophosphatemia 03/06/2015  . Acute encephalopathy 03/04/2015  . Altered mental status, unspecified 03/04/2015  . Hypokalemia 03/04/2015  . Leucocytosis 03/04/2015  .  Non-traumatic rhabdomyolysis 03/04/2015  . SIRS (systemic inflammatory response syndrome) ([place]) 03/04/2015  . Substance use disorder 03/04/2015  . Personal history of other malignant neoplasm of skin 04/23/2012    Past Surgical History:  Procedure Laterality Date  . BASAL CELL CARCINOMA EXCISION    . CHOLECYSTECTOMY N/A 04/28/2017   Procedure: LAPAROSCOPIC CHOLECYSTECTOMY;  Surgeon: Ralene Ok, MD;  Location: [facility];  Service: General;  Laterality: N/A;  . ENDOSCOPIC RETROGRADE CHOLANGIOPANCREATOGRAPHY (ERCP) WITH PROPOFOL N/A 06/26/2017   Procedure: ENDOSCOPIC RETROGRADE CHOLANGIOPANCREATOGRAPHY (ERCP) WITH PROPOFOL;  Surgeon: Gatha Mayer, MD;  Location: [facility] END[facility];  Service: Endoscopy;  Laterality: N/A;  . ERCP N/A 04/30/2017   Procedure: ENDOSCOPIC RETROGRADE CHOLANGIOPANCREATOGRAPHY (ERCP);  Surgeon: Gatha Mayer, MD;  Location: [facility] ENDOSCOPY;  Service: Endoscopy;  Laterality: N/A;  . LAPAROSCOPY N/A 07/18/2017   Procedure: LAPAROSCOPY DIAGNOSTIC, lysis of adhesions;  Surgeon: Greer Pickerel, MD;  Location: [facility];  Service: General;  Laterality: N/A;  . LAPAROTOMY  07/18/2017   Procedure: EXPLORATORY LAPAROTOMY, COMPLETION OF CHOLECYSTECTOMY, REPAIR OF SMALL BOWEL CIRRHOSIS, SMALL BOWEL RESECTION.;  Surgeon: Greer Pickerel, MD;  Location: [facility];  Service: General;;  . LUMBAR LAMINECTOMY    . REPLACEMENT TOTAL KNEE BILATERAL    . STENT REMOVAL  06/26/2017   Procedure: STENT REMOVAL;  Surgeon: Gatha Mayer, MD;  Location: [facility] ENDOS[facility];  Service: Endoscopy;;  . THYROIDECTOMY, PARTIAL  11/2018   at [facility]  . VIDEO BRONCHOSCOPY N/A 07/18/2017   Procedure: VIDEO BRONCHOSCOPY;  Surgeon: Collene Gobble, MD;  Location: [facility];  Service: Thoracic;  Laterality: N/A;       Family History  Problem Relation Age of Onset  . Heart disease Mother   . Heart disease Father     Social History   Tobacco Use  . Smoking status: Current Some Day Smoker    Packs/day: 0.30    Types: Cigarettes   . Smokeless tobacco: Never Used  . Tobacco comment: 2 cigarettes a week-using nicotine patches to help  Vaping Use  . Vaping Use: Never used  Substance Use Topics  . Alcohol use: Not Currently  . Drug use: Not Currently    Home Medications Prior to Admission medications   Medication Sig Start Date End Date Taking? Authorizing Provider  acetaminophen (TYLENOL) 500 MG tablet He can take 2 tablets every 8 hours as needed for pain.  This should be your first choice for pain medications.  Do not exceed more than 4000 mg of Tylenol per day.  This can harm your liver.  You can buy this at any drugstore over-the-counter. Patient taking differently: Take 1,000 mg by mouth every 8 (eight) hours as needed for mild pain. 08/01/17   Earnstine Regal, PA-C  albuterol (PROVENTIL HFA;VENTOLIN HFA) 108 (90 Base) MCG/ACT inhaler Inhale 2 puffs into the lungs every 6 (six) hours as needed for wheezing or shortness of breath. 10/02/17   Martyn Ehrich, NP  aspirin 81 MG EC tablet Take 81 mg by mouth daily. 03/20/18   [provider]  carvedilol (COREG) 25 MG tablet Do not restart this medicine until you have talked with your primary care and they have recommended you resume it.  Your blood pressure has been too low while you were here to be on this. Patient taking differently: Take 25 mg by mouth 2 (two) times daily with a meal. 08/01/17   Earnstine Regal, PA-C  Cholecalciferol 25 MCG (1000 UT) capsule Take 1,000 Units by mouth daily.    [provider]  cloNIDine (CATAPRES) 0.2 MG tablet Take 0.2 mg by mouth See admin instructions. Takes 2 tablets in the morning and 1 tablet at noon.    [provider]  cloNIDine (CATAPRES) 0.3 MG tablet Take 0.3 mg by mouth at bedtime.    [provider]  gabapentin (NEURONTIN) 400 MG capsule Take 800 mg by mouth 3 (three) times daily.    [provider]  hydrochlorothiazide (HYDRODIURIL) 25 MG tablet Take 25 mg by mouth daily.  09/20/19   [provider]  lisinopril (PRINIVIL,ZESTRIL) 40 MG tablet You are currently taking this as 1 tablet daily;   You were listed as taking this twice a day before admission. Patient taking differently: Take 40 mg by mouth daily. 08/01/17   Earnstine Regal, PA-C  methocarbamol (ROBAXIN) 750 MG tablet Take 750 mg by mouth 3 (three) times daily as needed for muscle spasms.     [provider]  mometasone (ASMANEX) 220 MCG/INH inhaler Inhale 1 puff into the lungs daily.    [provider]  Multiple Vitamins-Iron (MULTIVITAMINS WITH IRON) TABS tablet He can buy a One-A-Day multiple vitamin with iron and take 1 daily.  Follow package directions.  You can buy this over-the-counter at any drugstore. Patient not taking: No sig reported 08/01/17   Earnstine Regal, PA-C  nicotine (NICODERM CQ - DOSED IN MG/24 HOURS) 14 mg/24hr patch If you need this to stay off your cigarettes you can buy this over-the-counter at any drugstore and follow the package directions. Patient taking differently: Place 14 mg  onto the skin daily. 08/01/17   Earnstine Regal, PA-C  nitroGLYCERIN (NITROSTAT) 0.4 MG SL tablet Place 0.4 mg under the tongue every 5 (five) minutes as needed for chest pain.    [provider]  Olodaterol HCl 2.5 MCG/ACT AERS Inhale 2 puffs into the lungs daily.    [provider]  omeprazole (PRILOSEC) 20 MG capsule Take 40 mg by mouth daily.     [provider]  oxyCODONE (OXY IR/ROXICODONE) 5 MG immediate release tablet Take 5 mg by mouth every 4 (four) hours as needed for severe pain (prn).    [provider]  polyethylene glycol (MIRALAX / GLYCOLAX) packet If you have issues with constipation you can use this as the package directions instructed.  You can buy this over-the-counter at any drugstore. Patient taking differently: Take 17 g by mouth daily. 08/01/17   Earnstine Regal, PA-C  Potassium Chloride ER 20 MEQ TBCR Take 20 mEq by  mouth daily. 06/21/17   Gatha Mayer, MD  rosuvastatin (CRESTOR) 20 MG tablet Take 10 mg by mouth at bedtime.    [provider]  tamsulosin (FLOMAX) 0.4 MG CAPS capsule Take 0.4 mg by mouth daily.    [provider]  umeclidinium-vilanterol (ANORO ELLIPTA) 62.5-25 MCG/INH AEPB Inhale 1 puff into the lungs daily. Patient not taking: No sig reported 10/02/17   Martyn Ehrich, NP  umeclidinium-vilanterol [facility] ELLIPTA) 62.5-25 MCG/INH AEPB Inhale 1 puff into the lungs daily. Patient not taking: No sig reported 10/02/17   Martyn Ehrich, NP  zolpidem (AMBIEN) 5 MG tablet Take 1 tablet (5 mg total) by mouth at bedtime for 15 days. Patient not taking: Reported on 01/06/2020 09/04/17 09/19/17  Juanito Doom, MD    Allergies    Meloxicam and Mirtazapine  Review of Systems   Review of Systems  Constitutional:       Per HPI, otherwise negative  HENT:       Per HPI, otherwise negative  Respiratory:       Per HPI, otherwise negative  Cardiovascular:       Per HPI, otherwise negative  Gastrointestinal: Positive for abdominal pain, diarrhea and nausea. Negative for vomiting.  Endocrine:       Negative aside from HPI  Genitourinary:       Neg aside from HPI   Musculoskeletal:       Per HPI, otherwise negative  Skin: Negative.   Neurological: Negative for syncope.    Physical Exam Updated Vital Signs BP (!) 156/80   Pulse 66   Temp 97.7 F (36.5 C) (Oral)   Resp 18   Wt 62 kg   SpO2 99%   BMI 20.18 kg/m   Physical Exam Vitals and nursing note reviewed.  Constitutional:      General: He is not in acute distress.    Appearance: He is ill-appearing.  HENT:     Head: Normocephalic and atraumatic.  Eyes:     Conjunctiva/sclera: Conjunctivae normal.  Cardiovascular:     Rate and Rhythm: Normal rate and regular rhythm.  Pulmonary:     Effort: Pulmonary effort is normal. No respiratory distress.     Breath sounds: No stridor.  Abdominal:     General:  There is no distension.     Tenderness: There is abdominal tenderness in the periumbilical area.     Comments: Vertically oriented scar midline  Skin:    General: Skin is warm and dry.  Neurological:     Mental  Status: He is alert and oriented to person, place, and time.     Motor: Atrophy present.      ED Results / Procedures / Treatments   Labs (all labs ordered are listed, but only abnormal results are displayed) Labs Reviewed  COMPREHENSIVE METABOLIC PANEL - Abnormal; Notable for the following components:      Result Value   Sodium 132 (*)    Potassium 3.3 (*)    Chloride 90 (*)    Glucose, Bld 281 (*)    Creatinine, Ser 6.66 (*)    Calcium 8.6 (*)    GFR, Estimated 8 (*)    All other components within normal limits  CBC WITH DIFFERENTIAL/PLATELET - Abnormal; Notable for the following components:   Hemoglobin 12.4 (*)    HCT 38.4 (*)    RDW 15.7 (*)    All other components within normal limits  RESP PANEL BY RT-PCR (FLU A&B, COVID) ARPGX2  LIPASE, BLOOD  LACTIC ACID, PLASMA  URINALYSIS, ROUTINE W REFLEX MICROSCOPIC    EKG None  Radiology CT ABDOMEN PELVIS WO CONTRAST  Result Date: 04/05/2020 CLINICAL DATA:  Suspected bowel obstruction in a 75 year old male. EXAM: CT ABDOMEN AND PELVIS WITHOUT CONTRAST TECHNIQUE: Multidetector CT imaging of the abdomen and pelvis was performed following the standard protocol without IV contrast. COMPARISON:  Previous CT from January of 2022 FINDINGS: Lower chest: Patchy atelectasis and some ground-glass at the RIGHT lung base and to a lesser extent on the LEFT. No effusion. No lobar consolidative changes. Hepatobiliary: No focal, suspicious hepatic lesion on noncontrast imaging. Similar contour of the liver compared to prior evaluations. Post cholecystectomy. Pancreas: Pancreas with atrophy as before. Spleen: Spleen normal size and contour. Adrenals/Urinary Tract: Adrenal glands are normal. Kidneys with smooth contours. LEFT renal cyst  unchanged. No hydronephrosis. Urinary bladder with smooth contours. No nephrolithiasis. No ureteral calculi. Stomach/Bowel: Distended stomach. Duodenum crosses midline in the dilated loops of bowel which extend to the RIGHT. Small bowel anastomosis in the RIGHT hemiabdomen with bowel dilate chin reaching the anastomotic level and extending beyond the level of the anastomosis into the bowel as it again crosses the midline markedly decompressed at the level of bowel transition. Distortion of the central abdominal mesentery. Extensive edema within the mesenteric structures of collapsed bowel loops in the LEFT hemiabdomen. Transition occurs at the root of the small bowel mesentery best seen on image 66 of series 4. There is distortion of the terminal ileum as it passes through the area of bowel transition. Extensive mesenteric edema. Moderate volume ascites in the pelvis. No pneumatosis. Small bowel distension reaches 5 cm proximal to the anastomotic site which is also proximal to the site of bowel caliber change. Vascular/Lymphatic: Calcified atheromatous plaque in the abdominal aorta. There is no gastrohepatic or hepatoduodenal ligament lymphadenopathy. No retroperitoneal or mesenteric lymphadenopathy. No pelvic sidewall lymphadenopathy. Reproductive: Prostate with some heterogeneity. Other: Moderate volume ascites in the pelvis. Musculoskeletal: Spinal degenerative changes. No acute or destructive bone finding. IMPRESSION: 1. Markedly dilated small bowel with transition in the mid abdomen at the root of the small bowel mesentery with extensive mesenteric edema and ascites suspicious for high-grade small bowel obstruction due to internal hernia and or dense adhesions at the root of the small bowel mesentery. Mesenteric edema and ascites is concerning and marked and involves decompressed loops in the LEFT hemiabdomen. Correlation with lactate to assess for any signs of ischemia is suggested. No current pneumatosis. 2.  Patchy atelectasis and some ground-glass at the RIGHT  lung base and to a lesser extent on the LEFT. Findings may be related atelectasis. Correlate with any risk factors for aspiration. 3. Aortic atherosclerosis. These results were called by telephone at the time of interpretation on 04/05/2020 at 2:41 pm to provider Carmin Muskrat , who verbally acknowledged these results. Aortic Atherosclerosis (ICD10-I70.0). Electronically Signed   By: Zetta Bills M.D.   On: 04/05/2020 14:41    Procedures Procedures   Medications Ordered in ED Medications  0.9 %  sodium chloride infusion ( Intravenous Stopped 04/05/20 1436)  diatrizoate meglumine-sodium (GASTROGRAFIN) 66-10 % solution 90 mL (0 mLs Per NG tube Hold 04/05/20 1534)  nicotine (NICODERM CQ - dosed in mg/24 hr) patch 7 mg (7 mg Transdermal Patient Refused/Not Given 04/05/20 1632)  ondansetron (ZOFRAN) injection 4 mg (4 mg Intravenous Given 04/05/20 0945)  HYDROmorphone (DILAUDID) injection 1 mg (1 mg Intravenous Given 04/05/20 1010)  sodium chloride 0.9 % bolus 1,000 mL (0 mLs Intravenous Stopped 04/05/20 1436)  HYDROmorphone (DILAUDID) injection 1 mg (1 mg Intravenous Given 04/05/20 1148)  sodium chloride 0.9 % bolus 1,000 mL (0 mLs Intravenous Stopped 04/05/20 1632)  HYDROmorphone (DILAUDID) injection 1 mg (1 mg Intravenous Given 04/05/20 1513)  LORazepam (ATIVAN) injection 1 mg (1 mg Intravenous Given 04/05/20 1621)    ED Course  I have reviewed the triage vital signs and the nursing notes.  Pertinent labs & imaging results that were available during my care of the patient were reviewed by me and considered in my medical decision making (see chart for details).   Initial labs notable for acute kidney injury with creatinine greater than 6 up from baseline of 1 earlier this year.  Update: Patient substantially more comfortable after multiple boluses of Dilaudid.  He has been received fluid resuscitation, and I discussed his case with both our general surgery  team given findings of concern for bowel obstruction with the radiology team given the abnormal CT scan.  MDM Rules/Calculators/A&P Elderly male with multiple prior abdominal surgery presents with abdominal pain, intractable. Patient found to have small bowel obstruction, with concern for edematous changes.  Patient required consultation with our general surgery team, and admission to the internal medicine team given this finding.  In addition, new evidence for acute kidney injury complicates resuscitation efforts. Patient received IV fluids, Dilaudid in the emergency department.   Final Clinical Impression(s) / ED Diagnoses Final diagnoses:  Generalized abdominal pain  SBO (small bowel obstruction) ([facility])  AKI (acute kidney injury) ([place])   MDM Number of Diagnoses or Management Options AKI (acute kidney injury) ([place]): new, needed workup Generalized abdominal pain: new, needed workup SBO (small bowel obstruction) ([place]): new, needed workup   Amount and/or Complexity of Data Reviewed Clinical lab tests: reviewed and ordered Tests in the radiology section of CPT: ordered and reviewed Tests in the medicine section of CPT: reviewed and ordered Discussion of test results with the performing providers: yes Decide to obtain previous medical records or to obtain history from someone other than the patient: yes Obtain history from someone other than the patient: yes Review and summarize past medical records: yes Discuss the patient with other providers: yes Independent visualization of images, tracings, or specimens: yes  Risk of Complications, Morbidity, and/or Mortality Presenting problems: high Diagnostic procedures: high Management options: high  Critical Care Total time providing critical care: 30-74 minutes (45)  Patient Progress Patient progress: stable    Carmin Muskrat, MD 04/05/20 1714

## 2020-04-05 NOTE — ED Provider Notes (Signed)
Pt signed out by Dr. Vanita Panda pending admission call back from the hospitalists.  Pt d/w Dr. Grandville Silos (triad) for admission.   Isla Pence, MD 04/05/20 1745

## 2020-04-05 NOTE — H&P (Signed)
History and Physical    Gerald Farrell CZY:[id number] DOB: 1945-06-01 DOA: 04/05/2020  PCP: [facility], [facility] (Confirm with patient/family/NH records and if not entered, this has to be entered at [facility] point of entry) Patient coming from: Home  I have personally briefly reviewed patient's old medical records in [place]  Chief Complaint: Abdominal pain  HPI: Gerald Farrell is a 75 y.o. male with medical history significant of COPD with ongoing tobacco use, coronary artery disease/MI status post stent placement on aspirin 81 mg daily, hypertension, anxiety, gastroesophageal reflux disease, chronic back pain on daily narcotic pain medication, history of laparoscopic partial cholecystectomy with drain placement 04/28/2017 for gangrenous/purulent cholecystitis, expiratory laparotomy with small bowel resection and primary repair of small bowel serosal tear as well as completion of cholecystectomy 07/18/2017 per Dr. Redmond Pulling, recent hospitalization in January 2022 for small bowel obstruction which resolved with conservative treatment. Patient presented to the ED with 1 day history of worsening periumbilical abdominal pain which she describes as a piercing pain with some associated cramping, constant in nature, some associated abdominal bloating/distention.  Patient denies any fevers, no chills, no nausea, no emesis, no chest pain, no shortness of breath, no constipation, no melena, no hematemesis, no hematochezia, no hemoptysis, no syncope, no generalized weakness, no asymmetric weakness or numbness.  Patient does state that over the past month has been using laxatives of MiraLAX and has been having diarrhea/loose stools and has not had a good bowel movement in over a month.  ED Course: Patient seen in the ED, comprehensive metabolic profile with a sodium of 132, potassium of 3.3, chloride of 90, glucose of 281, creatinine of 6.66 otherwise was within normal limits.  Lactic acid level is 0.7.  CBC  with a white count of 6.3 hemoglobin of 12.4 platelet count of 189 otherwise was within normal limits.  SARS coronavirus 2 PCR negative.  Urinalysis not done and pending.  Abdominal films with enteric tube with tip in the body of the stomach.  Dilated small bowel loop in the mid abdomen. CT abdomen and pelvis obtained showed markedly dilated small bowel with transition in the mid abdomen at the root of the small bowel mesentery with extensive mesenteric edema and ascites suspicious for high-grade small bowel obstruction due to internal hernia and or dense adhesions at the root of the small bowel mesentery.  Mesenteric edema and ascites concerning and marked and involves decompressed loops in the left hemiabdomen.  No current pneumatosis.  Patchy atelectasis and groundglass at the right lung base and to a lesser extent on the left findings may be related to atelectasis.  Aortic atherosclerosis. General surgery consulted who recommended admission to the hospitalist group, placement of NG tube, small bowel protocol, supportive care and monitor.  Review of Systems: As per HPI otherwise all other systems reviewed and are negative.  Past Medical History:  Diagnosis Date  . Acute cholecystitis 04/27/2017  . Alcohol abuse   . Basal cell carcinoma   . Bile duct leak   . COPD (chronic obstructive pulmonary disease) ([place])   . Hypertension   . Pneumonia of right upper lobe due to infectious organism 08/13/2017    Past Surgical History:  Procedure Laterality Date  . BASAL CELL CARCINOMA EXCISION    . CHOLECYSTECTOMY N/A 04/28/2017   Procedure: LAPAROSCOPIC CHOLECYSTECTOMY;  Surgeon: Ralene Ok, MD;  Location: [place];  Service: General;  Laterality: N/A;  . ENDOSCOPIC RETROGRADE CHOLANGIOPANCREATOGRAPHY (ERCP) WITH PROPOFOL N/A 06/26/2017   Procedure: ENDOSCOPIC RETROGRADE CHOLANGIOPANCREATOGRAPHY (  ERCP) WITH PROPOFOL;  Surgeon: Gatha Mayer, MD;  Location: [facility];  Service: Endoscopy;   Laterality: N/A;  . ERCP N/A 04/30/2017   Procedure: ENDOSCOPIC RETROGRADE CHOLANGIOPANCREATOGRAPHY (ERCP);  Surgeon: Gatha Mayer, MD;  Location: [facility] ENDOSCOPY;  Service: Endoscopy;  Laterality: N/A;  . LAPAROSCOPY N/A 07/18/2017   Procedure: LAPAROSCOPY DIAGNOSTIC, lysis of adhesions;  Surgeon: Greer Pickerel, MD;  Location: [place];  Service: General;  Laterality: N/A;  . LAPAROTOMY  07/18/2017   Procedure: EXPLORATORY LAPAROTOMY, COMPLETION OF CHOLECYSTECTOMY, REPAIR OF SMALL BOWEL CIRRHOSIS, SMALL BOWEL RESECTION.;  Surgeon: Greer Pickerel, MD;  Location: [place];  Service: General;;  . LUMBAR LAMINECTOMY    . REPLACEMENT TOTAL KNEE BILATERAL    . STENT REMOVAL  06/26/2017   Procedure: STENT REMOVAL;  Surgeon: Gatha Mayer, MD;  Location: [facility];  Service: Endoscopy;;  . THYROIDECTOMY, PARTIAL  11/2018   at [facility]  . VIDEO BRONCHOSCOPY N/A 07/18/2017   Procedure: VIDEO BRONCHOSCOPY;  Surgeon: Collene Gobble, MD;  Location: [facility] OR;  Service: Thoracic;  Laterality: N/A;    Social History  reports that he has been smoking cigarettes. He has been smoking about 0.30 packs per day. He has never used smokeless tobacco. He reports previous alcohol use. He reports previous drug use.  Allergies  Allergen Reactions  . Meloxicam Other (See Comments)    unknown  . Mirtazapine Other (See Comments)    unknown    Family History  Problem Relation Age of Onset  . Heart disease Mother   . Heart disease Father    Mother deceased age 47 from kidney issues per patient.  Father deceased age 38 from MI.  Prior to Admission medications   Medication Sig Start Date End Date Taking? Authorizing Provider  acetaminophen (TYLENOL) 500 MG tablet He can take 2 tablets every 8 hours as needed for pain.  This should be your first choice for pain medications.  Do not exceed more than 4000 mg of Tylenol per day.  This can harm your liver.  You can buy this at any drugstore over-the-counter. Patient taking  differently: Take 1,000 mg by mouth every 8 (eight) hours as needed for mild pain. 08/01/17   Earnstine Regal, PA-C  albuterol (PROVENTIL HFA;VENTOLIN HFA) 108 (90 Base) MCG/ACT inhaler Inhale 2 puffs into the lungs every 6 (six) hours as needed for wheezing or shortness of breath. 10/02/17   Martyn Ehrich, NP  aspirin 81 MG chewable tablet Chew 81 mg by mouth daily.    [provider]  carvedilol (COREG) 25 MG tablet Do not restart this medicine until you have talked with your primary care and they have recommended you resume it.  Your blood pressure has been too low while you were here to be on this. Patient taking differently: Take 25 mg by mouth 2 (two) times daily with a meal. 08/01/17   Earnstine Regal, PA-C  Cholecalciferol 25 MCG (1000 UT) capsule Take 1,000 Units by mouth daily.    [provider]  cloNIDine (CATAPRES) 0.2 MG tablet Take 0.2 mg by mouth 3 (three) times daily.    [provider]  gabapentin (NEURONTIN) 400 MG capsule Take 800 mg by mouth 3 (three) times daily.    [provider]  hydrochlorothiazide (HYDRODIURIL) 25 MG tablet Take 25 mg by mouth daily. 09/20/19   [provider]  lisinopril (PRINIVIL,ZESTRIL) 40 MG tablet You are currently taking this as 1 tablet daily;   You were listed as taking  this twice a day before admission. Patient taking differently: Take 40 mg by mouth daily. 08/01/17   Earnstine Regal, PA-C  mometasone [facility] 220 MCG/INH inhaler Inhale 2 puffs into the lungs 2 (two) times daily.    [provider]  nicotine (NICODERM CQ - DOSED IN MG/24 HOURS) 14 mg/24hr patch If you need this to stay off your cigarettes you can buy this over-the-counter at any drugstore and follow the package directions. Patient taking differently: Place 14 mg onto the skin daily. 08/01/17   Earnstine Regal, PA-C  nitroGLYCERIN (NITROSTAT) 0.4 MG SL tablet Place 0.4 mg under the tongue every 5 (five) minutes as needed for  chest pain.    [provider]  omeprazole (PRILOSEC) 20 MG capsule Take 40 mg by mouth daily.     [provider]  oxyCODONE (OXY IR/ROXICODONE) 5 MG immediate release tablet Take 5 mg by mouth every 4 (four) hours as needed for severe pain (prn).    [provider]  polyethylene glycol (MIRALAX / GLYCOLAX) packet If you have issues with constipation you can use this as the package directions instructed.  You can buy this over-the-counter at any drugstore. Patient taking differently: Take 17 g by mouth daily as needed for mild constipation. 08/01/17   Earnstine Regal, PA-C  Potassium Chloride ER 20 MEQ TBCR Take 20 mEq by mouth daily. 06/21/17   Gatha Mayer, MD  rosuvastatin (CRESTOR) 20 MG tablet Take 10 mg by mouth at bedtime.    [provider]  tamsulosin (FLOMAX) 0.4 MG CAPS capsule Take 0.4 mg by mouth daily.    [provider]  Tiotropium Bromide-Olodaterol (STIOLTO RESPIMAT) 2.5-2.5 MCG/ACT AERS Inhale 2 puffs into the lungs 2 (two) times daily.    [provider]  zolpidem (AMBIEN) 5 MG tablet Take 5 mg by mouth at bedtime.    [provider]    Physical Exam: Vitals:   04/05/20 1630 04/05/20 1645 04/05/20 1715 04/05/20 1730  BP: (!) 199/145 (!) 201/107 (!) 153/100 (!) 154/90  Pulse: 84 87 76 77  Resp: 12 16 15 12   Temp:      TempSrc:      SpO2: 100% 99% 96% 97%  Weight:        Constitutional: NAD, calm, comfortable, NG tube in place with bilious material. Vitals:   04/05/20 1630 04/05/20 1645 04/05/20 1715 04/05/20 1730  BP: (!) 199/145 (!) 201/107 (!) 153/100 (!) 154/90  Pulse: 84 87 76 77  Resp: 12 16 15 12   Temp:      TempSrc:      SpO2: 100% 99% 96% 97%  Weight:       Eyes: PERRL, lids and conjunctivae normal ENMT: Mucous membranes are dry. Posterior pharynx clear of any exudate or lesions.Normal dentition.  Neck: normal, supple, no masses, no thyromegaly Respiratory: Decreased breath sounds in the  bases.  Poor to fair air movement.  No wheezing.  No crackles.  Some scattered coarse breath sounds.  No use of accessory muscles of respiration.  Speaking in full sentences.  Cardiovascular: Regular rate and rhythm, no murmurs / rubs / gallops. No extremity edema. 2+ pedal pulses. No carotid bruits.  Abdomen: Soft, some tenderness to palpation in the mid/periumbilical region.  Hypoactive bowel sounds.  Nondistended.  No rebound.  No guarding.   Musculoskeletal: no clubbing / cyanosis. No joint deformity upper and lower extremities. Good ROM, no contractures. Normal muscle tone.  Skin: no rashes, lesions, ulcers. No induration Neurologic: CN  2-12 grossly intact. Sensation intact, DTR normal. Strength 5/5 in all 4.  Psychiatric: Normal judgment and insight. Alert and oriented x 3. Normal mood.    Labs on Admission: I have personally reviewed following labs and imaging studies  CBC: Recent Labs  Lab 04/05/20 0932  WBC 6.3  NEUTROABS 4.7  HGB 12.4*  HCT 38.4*  MCV 84.8  PLT 025    Basic Metabolic Panel: Recent Labs  Lab 04/05/20 0932  NA 132*  K 3.3*  CL 90*  CO2 30  GLUCOSE 281*  BUN 10  CREATININE 6.66*  CALCIUM 8.6*    GFR: Estimated Creatinine Clearance: 8.8 mL/min (A) (by C-G formula based on SCr of 6.66 mg/dL (H)).  Liver Function Tests: Recent Labs  Lab 04/05/20 0932  AST 28  ALT 14  ALKPHOS 114  BILITOT 1.0  PROT 7.8  ALBUMIN 3.8    Urine analysis:    Component Value Date/Time   COLORURINE ORANGE (A) 01/06/2020 1537   APPEARANCEUR CLEAR 01/06/2020 1537   LABSPEC 1.015 01/06/2020 1537   PHURINE 6.5 01/06/2020 1537   GLUCOSEU NEGATIVE 01/06/2020 1537   HGBUR NEGATIVE 01/06/2020 1537   BILIRUBINUR LARGE (A) 01/06/2020 1537   KETONESUR NEGATIVE 01/06/2020 1537   PROTEINUR NEGATIVE 01/06/2020 1537   NITRITE NEGATIVE 01/06/2020 1537   LEUKOCYTESUR NEGATIVE 01/06/2020 1537    Radiological Exams on Admission: CT ABDOMEN PELVIS WO CONTRAST  Result  Date: 04/05/2020 CLINICAL DATA:  Suspected bowel obstruction in a 75 year old male. EXAM: CT ABDOMEN AND PELVIS WITHOUT CONTRAST TECHNIQUE: Multidetector CT imaging of the abdomen and pelvis was performed following the standard protocol without IV contrast. COMPARISON:  Previous CT from January of 2022 FINDINGS: Lower chest: Patchy atelectasis and some ground-glass at the RIGHT lung base and to a lesser extent on the LEFT. No effusion. No lobar consolidative changes. Hepatobiliary: No focal, suspicious hepatic lesion on noncontrast imaging. Similar contour of the liver compared to prior evaluations. Post cholecystectomy. Pancreas: Pancreas with atrophy as before. Spleen: Spleen normal size and contour. Adrenals/Urinary Tract: Adrenal glands are normal. Kidneys with smooth contours. LEFT renal cyst unchanged. No hydronephrosis. Urinary bladder with smooth contours. No nephrolithiasis. No ureteral calculi. Stomach/Bowel: Distended stomach. Duodenum crosses midline in the dilated loops of bowel which extend to the RIGHT. Small bowel anastomosis in the RIGHT hemiabdomen with bowel dilate chin reaching the anastomotic level and extending beyond the level of the anastomosis into the bowel as it again crosses the midline markedly decompressed at the level of bowel transition. Distortion of the central abdominal mesentery. Extensive edema within the mesenteric structures of collapsed bowel loops in the LEFT hemiabdomen. Transition occurs at the root of the small bowel mesentery best seen on image 66 of series 4. There is distortion of the terminal ileum as it passes through the area of bowel transition. Extensive mesenteric edema. Moderate volume ascites in the pelvis. No pneumatosis. Small bowel distension reaches 5 cm proximal to the anastomotic site which is also proximal to the site of bowel caliber change. Vascular/Lymphatic: Calcified atheromatous plaque in the abdominal aorta. There is no gastrohepatic or  hepatoduodenal ligament lymphadenopathy. No retroperitoneal or mesenteric lymphadenopathy. No pelvic sidewall lymphadenopathy. Reproductive: Prostate with some heterogeneity. Other: Moderate volume ascites in the pelvis. Musculoskeletal: Spinal degenerative changes. No acute or destructive bone finding. IMPRESSION: 1. Markedly dilated small bowel with transition in the mid abdomen at the root of the small bowel mesentery with extensive mesenteric edema and ascites suspicious for high-grade small bowel obstruction due to  internal hernia and or dense adhesions at the root of the small bowel mesentery. Mesenteric edema and ascites is concerning and marked and involves decompressed loops in the LEFT hemiabdomen. Correlation with lactate to assess for any signs of ischemia is suggested. No current pneumatosis. 2. Patchy atelectasis and some ground-glass at the RIGHT lung base and to a lesser extent on the LEFT. Findings may be related atelectasis. Correlate with any risk factors for aspiration. 3. Aortic atherosclerosis. These results were called by telephone at the time of interpretation on 04/05/2020 at 2:41 pm to provider Carmin Muskrat , who verbally acknowledged these results. Aortic Atherosclerosis (ICD10-I70.0). Electronically Signed   By: Zetta Bills M.D.   On: 04/05/2020 14:41   DG Abd Portable 1V-Small Bowel Protocol-Position Verification  Result Date: 04/05/2020 CLINICAL DATA:  75 year old male status post NG placement. EXAM: PORTABLE ABDOMEN - 1 VIEW COMPARISON:  CT abdomen pelvis dated 04/05/2020. FINDINGS: Enteric tube with side-port in the proximal stomach and tip in the body of the stomach. Dilated small bowel loop in the mid abdomen measures up to 6.1 cm in caliber. IMPRESSION: 1. Enteric tube with tip in the body of the stomach. 2. Dilated small bowel loop in the mid abdomen. Electronically Signed   By: Anner Crete M.D.   On: 04/05/2020 17:12    EKG: Not done  Assessment/Plan Principal  Problem:   SBO (small bowel obstruction) (HCC) Active Problems:   Protein-calorie malnutrition, severe   COPD (chronic obstructive pulmonary disease) (HCC)   Anxiety   HTN (hypertension)   Hypokalemia   ARF (acute renal failure) (HCC)   Dehydration    1 high-grade small bowel obstruction -Likely secondary to hernia and adhesions as noted on CT abdomen and pelvis -Patient already seen by general surgery NG tube placed and surgery recommending bowel rest, IV fluids, small bowel protocol. -Replete electrolytes to keep potassium approximately at 4, magnesium approximately at 2. -IV antiemetics.  IV pain management.  Supportive care. -Per general surgery.  2.  Acute renal failure -Creatinine on admission noted at 6.66.  Last creatinine of 0.78 (01/10/2020). -Likely secondary to prerenal azotemia secondary to problem #1 in the setting of ACE inhibitor and diuretic. -Check urine sodium, urine creatinine, UA with cultures and sensitivity. -CT abdomen and pelvis done negative for hydronephrosis. -Repeat labs. -Place Foley catheter. -Strict I's and O's.  Daily weights. -Give 1 L fluid bolus. -Placed on IV fluids normal saline 125 cc/h. -If no significant improvement and worsening renal function will need to consult with nephrology for further evaluation and management.  3.  Dehydration IV fluids.  4.  Hypertension Patient currently n.p.o. secondary to problem #1. -Hold home regimen of antihypertensive medications secondary to problem #2. -Place on Lopressor 5 mg IV every 8 hours. -IV hydralazine as needed.  5.  Gastroesophageal reflux disease -PPI.  6.  COPD/ongoing tobacco use -Tobacco cessation stressed to patient. -Patient requesting nicotine patch which we will order. -Place on Dulera, Incruse, duo nebs as needed.  7.  Severe protein calorie malnutrition -Currently n.p.o. secondary to problem #1. -Once on a diet and tolerating oral intake will place on nutritional  supplementation.  Follow.  8.  Anxiety -IV Ativan 0.5 mg twice daily as needed.  9.  Chronic pain -IV Dilaudid as needed. -Once renal function improves could likely discontinue IV Dilaudid and placed on IV morphine as needed.  10.  Coronary artery disease -As patient currently n.p.o. unable to place on most of home regimen including aspirin, ACE inhibitor. -  Place on IV Lopressor.  11.  Hypokalemia -Replete.   DVT prophylaxis: SCDs Code Status:   Full Family Communication:  Updated patient.  No family at bedside Disposition Plan:   Patient is from:  Home  Anticipated DC to:  Likely home with home health  Anticipated DC date:  Unknown  Anticipated DC barriers: Resolution of small bowel obstruction  Consults called: General surgery: Dr. Harlow Asa Admission status:  Admit to inpatient/MedSurg.  Severity of Illness:     Irine Seal MD [facility]  How to contact the [facility] Attending or Consulting provider [place] or covering provider during after hours [place], for this patient?   1. Check the care team in [facility] and look for a) attending/consulting [facility] provider listed and b) the [facility] team listed 2. Log into [email or web address] and use [place]'s universal password to access. If you do not have the password, please contact the hospital operator. 3. Locate the [facility] provider you are looking for under [facility] and page to a number that you can be directly reached. 4. If you still have difficulty reaching the provider, please page the [facility] (Director on Call) for the Hospitalists listed on amion for assistance.  04/05/2020, 6:40 PM

## 2020-04-05 NOTE — Consult Note (Signed)
[facility] Surgery Consult Note  Gerald Farrell 1945/08/20  [id number].    Requesting MD: Carmin Muskrat Chief Complaint/Reason for Consult: SBO  HPI:  Gerald Farrell is a 76yo male PMH COPD, CAD/ MI ~2020 requiring stent placement (takes ASA 81mg  daily), HTN, GERD, anxiety, and chronic back pain requiring daily narcotic use, who presented to [facility] earlier today complaining of acute onset abdominal pain. Past surgical history includes laparoscopic partial cholecystectomy with drain placement 04/28/2017 by Dr. Rosendo Gros for gangrenous/ purulent cholecystitis, and Exploratory laparotomy with small bowel resection and primary repair of small bowel serosal tear as well as completion cholecystectomy 07/18/2017 by Dr. Redmond Pulling. He was admitted to [facility] earlier this year 01/06/20 through 01/12/20 for SBO that resolved with conservative management. States that this time he developed diffuse abdominal pain last night. He had taken a laxative earlier in the day which gave him some diarrhea, but he was having no abdominal pain at that time. Pain is now periumbilical and associated with nausea and bloating. No emesis. Denies fever or chills. He passed some flatus early this morning, none since then. Due to persistent pain he decided to come to the ED.  In the ED patient was noted to be hypertensive, otherwise VSS. WBC 6.3, Na 132, Cl 90, K 3.3, Cr 6.66. Lactic acid is pending. CT abdomen pelvis revealed markedly dilated small bowel with transition in the mid abdomen at the root of the small bowel mesentery with extensive mesenteric edema and ascites suspicious for high-grade small bowel obstruction due to internal hernia and or dense adhesions at the root of the small bowel mesentery; no current pneumatosis.  General surgery asked to see.  Smokes 1/2 PPD Former h/o alcohol dependence >20 years ago Lives at home with his son Ambulates with a cane  Review of Systems  Constitutional: Negative.   Respiratory:  Negative.   Cardiovascular: Negative.   Gastrointestinal: Positive for abdominal pain and nausea. Negative for constipation, diarrhea and vomiting.  Genitourinary: Negative.   Musculoskeletal: Positive for back pain.   All systems reviewed and otherwise negative except for as above  Family History  Problem Relation Age of Onset  . Heart disease Mother   . Heart disease Father     Past Medical History:  Diagnosis Date  . Acute cholecystitis 04/27/2017  . Alcohol abuse   . Basal cell carcinoma   . Bile duct leak   . COPD (chronic obstructive pulmonary disease) ([place])   . Hypertension   . Pneumonia of right upper lobe due to infectious organism 08/13/2017    Past Surgical History:  Procedure Laterality Date  . BASAL CELL CARCINOMA EXCISION    . CHOLECYSTECTOMY N/A 04/28/2017   Procedure: LAPAROSCOPIC CHOLECYSTECTOMY;  Surgeon: Ralene Ok, MD;  Location: [place];  Service: General;  Laterality: N/A;  . ENDOSCOPIC RETROGRADE CHOLANGIOPANCREATOGRAPHY (ERCP) WITH PROPOFOL N/A 06/26/2017   Procedure: ENDOSCOPIC RETROGRADE CHOLANGIOPANCREATOGRAPHY (ERCP) WITH PROPOFOL;  Surgeon: Gatha Mayer, MD;  Location: [facility] ENDOSCOPY;  Service: Endoscopy;  Laterality: N/A;  . ERCP N/A 04/30/2017   Procedure: ENDOSCOPIC RETROGRADE CHOLANGIOPANCREATOGRAPHY (ERCP);  Surgeon: Gatha Mayer, MD;  Location: [facility] ENDOSCOPY;  Service: Endoscopy;  Laterality: N/A;  . LAPAROSCOPY N/A 07/18/2017   Procedure: LAPAROSCOPY DIAGNOSTIC, lysis of adhesions;  Surgeon: Greer Pickerel, MD;  Location: [place];  Service: General;  Laterality: N/A;  . LAPAROTOMY  07/18/2017   Procedure: EXPLORATORY LAPAROTOMY, COMPLETION OF CHOLECYSTECTOMY, REPAIR OF SMALL BOWEL CIRRHOSIS, SMALL BOWEL RESECTION.;  Surgeon: Greer Pickerel, MD;  Location: [facility]  OR;  Service: General;;  . LUMBAR LAMINECTOMY    . REPLACEMENT TOTAL KNEE BILATERAL    . STENT REMOVAL  06/26/2017   Procedure: STENT REMOVAL;  Surgeon: Gatha Mayer, MD;  Location: [facility]  ENDOSCOPY;  Service: Endoscopy;;  . THYROIDECTOMY, PARTIAL  11/2018   at [facility]  . VIDEO BRONCHOSCOPY N/A 07/18/2017   Procedure: VIDEO BRONCHOSCOPY;  Surgeon: Collene Gobble, MD;  Location: [facility] OR;  Service: Thoracic;  Laterality: N/A;    Social History:  reports that he has been smoking cigarettes. He has been smoking about 0.30 packs per day. He has never used smokeless tobacco. He reports previous alcohol use. He reports previous drug use.  Allergies:  Allergies  Allergen Reactions  . Meloxicam Other (See Comments)    unknown  . Mirtazapine Other (See Comments)    unknown    (Not in a hospital admission)   Prior to Admission medications   Medication Sig Start Date End Date Taking? Authorizing Provider  acetaminophen (TYLENOL) 500 MG tablet He can take 2 tablets every 8 hours as needed for pain.  This should be your first choice for pain medications.  Do not exceed more than 4000 mg of Tylenol per day.  This can harm your liver.  You can buy this at any drugstore over-the-counter. Patient taking differently: Take 1,000 mg by mouth every 8 (eight) hours as needed for mild pain. 08/01/17   Earnstine Regal, PA-C  albuterol (PROVENTIL HFA;VENTOLIN HFA) 108 (90 Base) MCG/ACT inhaler Inhale 2 puffs into the lungs every 6 (six) hours as needed for wheezing or shortness of breath. 10/02/17   Martyn Ehrich, NP  aspirin 81 MG chewable tablet Chew 81 mg by mouth daily.    [provider]  carvedilol (COREG) 25 MG tablet Do not restart this medicine until you have talked with your primary care and they have recommended you resume it.  Your blood pressure has been too low while you were here to be on this. Patient taking differently: Take 25 mg by mouth 2 (two) times daily with a meal. 08/01/17   Earnstine Regal, PA-C  Cholecalciferol 25 MCG (1000 UT) capsule Take 1,000 Units by mouth daily.    [provider]  cloNIDine (CATAPRES) 0.2 MG tablet Take 0.2 mg by mouth 3 (three)  times daily.    [provider]  gabapentin (NEURONTIN) 400 MG capsule Take 800 mg by mouth 3 (three) times daily.    [provider]  hydrochlorothiazide (HYDRODIURIL) 25 MG tablet Take 25 mg by mouth daily. 09/20/19   [provider]  lisinopril (PRINIVIL,ZESTRIL) 40 MG tablet You are currently taking this as 1 tablet daily;   You were listed as taking this twice a day before admission. Patient taking differently: Take 40 mg by mouth daily. 08/01/17   Earnstine Regal, PA-C  mometasone [facility]) 220 MCG/INH inhaler Inhale 2 puffs into the lungs 2 (two) times daily.    [provider]  nicotine (NICODERM CQ - DOSED IN MG/24 HOURS) 14 mg/24hr patch If you need this to stay off your cigarettes you can buy this over-the-counter at any drugstore and follow the package directions. Patient taking differently: Place 14 mg onto the skin daily. 08/01/17   Earnstine Regal, PA-C  nitroGLYCERIN (NITROSTAT) 0.4 MG SL tablet Place 0.4 mg under the tongue every 5 (five) minutes as needed for chest pain.    [provider]  omeprazole (PRILOSEC) 20 MG capsule Take 40 mg by mouth daily.  [provider]  oxyCODONE (OXY IR/ROXICODONE) 5 MG immediate release tablet Take 5 mg by mouth every 4 (four) hours as needed for severe pain (prn).    [provider]  polyethylene glycol (MIRALAX / GLYCOLAX) packet If you have issues with constipation you can use this as the package directions instructed.  You can buy this over-the-counter at any drugstore. Patient taking differently: Take 17 g by mouth daily as needed for mild constipation. 08/01/17   Earnstine Regal, PA-C  Potassium Chloride ER 20 MEQ TBCR Take 20 mEq by mouth daily. 06/21/17   Gatha Mayer, MD  rosuvastatin (CRESTOR) 20 MG tablet Take 10 mg by mouth at bedtime.    [provider]  tamsulosin (FLOMAX) 0.4 MG CAPS capsule Take 0.4 mg by mouth daily.    [provider]   Tiotropium Bromide-Olodaterol (STIOLTO RESPIMAT) 2.5-2.5 MCG/ACT AERS Inhale 2 puffs into the lungs 2 (two) times daily.    [provider]  zolpidem (AMBIEN) 5 MG tablet Take 5 mg by mouth at bedtime.    [provider]    Blood pressure (!) 146/94, pulse 79, temperature 97.7 F (36.5 C), temperature source Oral, resp. rate 18, weight 62 kg, SpO2 100 %. Physical Exam: General: pleasant, elderly male who is laying in bed in NAD HEENT: head is normocephalic, atraumatic.  Sclera are noninjected.  Pupils equal and round.  Ears and nose without any masses or lesions.  Mouth is pink and moist. Dentition fair Heart: regular, rate, and rhythm.  Normal s1,s2. No obvious murmurs, gallops, or rubs noted.  Palpable pedal pulses bilaterally  Lungs: CTAB, no wheezes, rhonchi, or rales noted.  Respiratory effort nonlabored on room air Abd: well healed midline and laparoscopic incisions, soft, mild distension, TTP periumbilical and suprapubic regions with some voluntary guarding but no rebound or peritonitis. +BS, no masses, hernias, or organomegaly.  MS: no BUE/BLE edema, calves soft and nontender Skin: warm and dry with no masses, lesions, or rashes Psych: A&Ox4 with an appropriate affect Neuro: cranial nerves grossly intact, equal strength in BUE/BLE bilaterally, normal speech, thought process intact  Results for orders placed or performed during the hospital encounter of 04/05/20 (from the past 48 hour(s))  Comprehensive metabolic panel     Status: Abnormal   Collection Time: 04/05/20  9:32 AM  Result Value Ref Range   Sodium 132 (L) 135 - 145 mmol/L   Potassium 3.3 (L) 3.5 - 5.1 mmol/L   Chloride 90 (L) 98 - 111 mmol/L   CO2 30 22 - 32 mmol/L   Glucose, Bld 281 (H) 70 - 99 mg/dL    Comment: Glucose reference range applies only to samples taken after fasting for at least 8 hours.   BUN 10 8 - 23 mg/dL   Creatinine, Ser 6.66 (H) 0.61 - 1.24 mg/dL   Calcium 8.6 (L) 8.9 - 10.3  mg/dL   Total Protein 7.8 6.5 - 8.1 g/dL   Albumin 3.8 3.5 - 5.0 g/dL   AST 28 15 - 41 U/L   ALT 14 0 - 44 U/L   Alkaline Phosphatase 114 38 - 126 U/L   Total Bilirubin 1.0 0.3 - 1.2 mg/dL   GFR, Estimated 8 (L) >60 mL/min    Comment: (NOTE) Calculated using the CKD-EPI Creatinine Equation (2021)    Anion gap 12 5 - 15    Comment: Performed at [facility], [place] [address] [place], [place] [address]  Lipase, blood     Status:  None   Collection Time: 04/05/20  9:32 AM  Result Value Ref Range   Lipase 26 11 - 51 U/L    Comment: Performed at [facility], [place] [address] [place], [place] [address]  CBC WITH DIFFERENTIAL     Status: Abnormal   Collection Time: 04/05/20  9:32 AM  Result Value Ref Range   WBC 6.3 4.0 - 10.5 K/uL   RBC 4.53 4.22 - 5.81 MIL/uL   Hemoglobin 12.4 (L) 13.0 - 17.0 g/dL   HCT 38.4 (L) 39.0 - 52.0 %   MCV 84.8 80.0 - 100.0 fL   MCH 27.4 26.0 - 34.0 pg   MCHC 32.3 30.0 - 36.0 g/dL   RDW 15.7 (H) 11.5 - 15.5 %   Platelets 189 150 - 400 K/uL   nRBC 0.0 0.0 - 0.2 %   Neutrophils Relative % 75 %   Neutro Abs 4.7 1.7 - 7.7 K/uL   Lymphocytes Relative 17 %   Lymphs Abs 1.1 0.7 - 4.0 K/uL   Monocytes Relative 6 %   Monocytes Absolute 0.4 0.1 - 1.0 K/uL   Eosinophils Relative 1 %   Eosinophils Absolute 0.1 0.0 - 0.5 K/uL   Basophils Relative 1 %   Basophils Absolute 0.1 0.0 - 0.1 K/uL   Immature Granulocytes 0 %   Abs Immature Granulocytes 0.02 0.00 - 0.07 K/uL    Comment: Performed at [facility], [place] [address] [place], [place] [address]   CT ABDOMEN PELVIS WO CONTRAST  Result Date: 04/05/2020 CLINICAL DATA:  Suspected bowel obstruction in a 75 year old male. EXAM: CT ABDOMEN AND PELVIS WITHOUT CONTRAST TECHNIQUE: Multidetector CT imaging of the abdomen and pelvis was performed following the standard protocol without IV contrast. COMPARISON:  Previous CT from January of 2022 FINDINGS: Lower chest:  Patchy atelectasis and some ground-glass at the RIGHT lung base and to a lesser extent on the LEFT. No effusion. No lobar consolidative changes. Hepatobiliary: No focal, suspicious hepatic lesion on noncontrast imaging. Similar contour of the liver compared to prior evaluations. Post cholecystectomy. Pancreas: Pancreas with atrophy as before. Spleen: Spleen normal size and contour. Adrenals/Urinary Tract: Adrenal glands are normal. Kidneys with smooth contours. LEFT renal cyst unchanged. No hydronephrosis. Urinary bladder with smooth contours. No nephrolithiasis. No ureteral calculi. Stomach/Bowel: Distended stomach. Duodenum crosses midline in the dilated loops of bowel which extend to the RIGHT. Small bowel anastomosis in the RIGHT hemiabdomen with bowel dilate chin reaching the anastomotic level and extending beyond the level of the anastomosis into the bowel as it again crosses the midline markedly decompressed at the level of bowel transition. Distortion of the central abdominal mesentery. Extensive edema within the mesenteric structures of collapsed bowel loops in the LEFT hemiabdomen. Transition occurs at the root of the small bowel mesentery best seen on image 66 of series 4. There is distortion of the terminal ileum as it passes through the area of bowel transition. Extensive mesenteric edema. Moderate volume ascites in the pelvis. No pneumatosis. Small bowel distension reaches 5 cm proximal to the anastomotic site which is also proximal to the site of bowel caliber change. Vascular/Lymphatic: Calcified atheromatous plaque in the abdominal aorta. There is no gastrohepatic or hepatoduodenal ligament lymphadenopathy. No retroperitoneal or mesenteric lymphadenopathy. No pelvic sidewall lymphadenopathy. Reproductive: Prostate with some heterogeneity. Other: Moderate volume ascites in the pelvis. Musculoskeletal: Spinal degenerative changes. No acute or destructive bone finding. IMPRESSION: 1. Markedly dilated  small bowel with transition in the mid abdomen at the  root of the small bowel mesentery with extensive mesenteric edema and ascites suspicious for high-grade small bowel obstruction due to internal hernia and or dense adhesions at the root of the small bowel mesentery. Mesenteric edema and ascites is concerning and marked and involves decompressed loops in the LEFT hemiabdomen. Correlation with lactate to assess for any signs of ischemia is suggested. No current pneumatosis. 2. Patchy atelectasis and some ground-glass at the RIGHT lung base and to a lesser extent on the LEFT. Findings may be related atelectasis. Correlate with any risk factors for aspiration. 3. Aortic atherosclerosis. These results were called by telephone at the time of interpretation on 04/05/2020 at 2:41 pm to provider Carmin Muskrat , who verbally acknowledged these results. Aortic Atherosclerosis (ICD10-I70.0). Electronically Signed   By: Zetta Bills M.D.   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 Rosendo Gros for gangrenous/ purulent cholecystitis; Exploratory laparotomy with small bowel resection and primary repair of small bowel serosal tear as well as completion cholecystectomy 07/18/2017 by Dr. Redmond Pulling - recent admission for SBO 01/06/20 through 01/12/20 for SBO that resolved with conservative management - CT scan today shows markedly dilated small bowel with transition in the mid abdomen at the root of the small bowel mesentery with extensive mesenteric edema and  ascites suspicious for high-grade small bowel obstruction due to internal hernia and or dense adhesions at the root of the small bowel mesentery; no current pneumatosis  ID - none VTE - ok for chemical DVT prophylaxis from surgical standpoint FEN - IVF, NPO/NGT to LIWS Foley - none Follow up - TBD  Plan - Recommend medical admission for rehydration and resuscitation. Patient seen and examined with Dr. Harlow Asa. CT scan findings concerning (as above) but his WBC is WNL, VSS, and he has no peritonitis on exam. Will obtain lactic acid level. Plan to place NG tube for decompression and start small bowel protocol. If patient does not improve soon he may require operative intervention.  Wellington Hampshire, [place] [facility] 04/05/2020, 3:26 PM Please see Amion for pager number during day hours 7:00am-4:30pm

## 2020-04-05 NOTE — ED Notes (Signed)
Correction for urine...no sample collected at this time

## 2020-04-05 NOTE — ED Triage Notes (Signed)
abd pain since last night to umbilical area. Denies n/v

## 2020-04-06 ENCOUNTER — Inpatient Hospital Stay (HOSPITAL_COMMUNITY): Payer: No Typology Code available for payment source

## 2020-04-06 DIAGNOSIS — K56609 Unspecified intestinal obstruction, unspecified as to partial versus complete obstruction: Secondary | ICD-10-CM | POA: Diagnosis not present

## 2020-04-06 DIAGNOSIS — J449 Chronic obstructive pulmonary disease, unspecified: Secondary | ICD-10-CM

## 2020-04-06 DIAGNOSIS — E43 Unspecified severe protein-calorie malnutrition: Secondary | ICD-10-CM

## 2020-04-06 DIAGNOSIS — N179 Acute kidney failure, unspecified: Secondary | ICD-10-CM

## 2020-04-06 DIAGNOSIS — E876 Hypokalemia: Secondary | ICD-10-CM | POA: Diagnosis not present

## 2020-04-06 LAB — CBC
HCT: 40.3 % (ref 39.0–52.0)
Hemoglobin: 13.5 g/dL (ref 13.0–17.0)
MCH: 32.3 pg (ref 26.0–34.0)
MCHC: 33.5 g/dL (ref 30.0–36.0)
MCV: 96.4 fL (ref 80.0–100.0)
Platelets: 157 10*3/uL (ref 150–400)
RBC: 4.18 MIL/uL — ABNORMAL LOW (ref 4.22–5.81)
RDW: 15.2 % (ref 11.5–15.5)
WBC: 8.8 10*3/uL (ref 4.0–10.5)
nRBC: 0 % (ref 0.0–0.2)

## 2020-04-06 LAB — COMPREHENSIVE METABOLIC PANEL
ALT: 11 U/L (ref 0–44)
AST: 23 U/L (ref 15–41)
Albumin: 2.5 g/dL — ABNORMAL LOW (ref 3.5–5.0)
Alkaline Phosphatase: 44 U/L (ref 38–126)
Anion gap: 7 (ref 5–15)
BUN: 10 mg/dL (ref 8–23)
CO2: 21 mmol/L — ABNORMAL LOW (ref 22–32)
Calcium: 7.8 mg/dL — ABNORMAL LOW (ref 8.9–10.3)
Chloride: 106 mmol/L (ref 98–111)
Creatinine, Ser: 0.64 mg/dL (ref 0.61–1.24)
GFR, Estimated: >60 mL/min (ref >60)
Glucose, Bld: 73 mg/dL (ref 70–99)
Potassium: 4.3 mmol/L (ref 3.5–5.1)
Sodium: 134 mmol/L — ABNORMAL LOW (ref 135–145)
Total Bilirubin: 1 mg/dL (ref 0.3–1.2)
Total Protein: 4.6 g/dL — ABNORMAL LOW (ref 6.5–8.1)

## 2020-04-06 LAB — URINALYSIS, ROUTINE W REFLEX MICROSCOPIC
Bilirubin Urine: NEGATIVE
Glucose, UA: NEGATIVE mg/dL
Hgb urine dipstick: NEGATIVE
Ketones, ur: 5 mg/dL — AB
Leukocytes,Ua: NEGATIVE
Nitrite: NEGATIVE
Protein, ur: NEGATIVE mg/dL
Specific Gravity, Urine: 1.017 (ref 1.005–1.030)
pH: 6 (ref 5.0–8.0)

## 2020-04-06 LAB — GLUCOSE, CAPILLARY
Glucose-Capillary: 118 mg/dL — ABNORMAL HIGH (ref 70–99)
Glucose-Capillary: 58 mg/dL — ABNORMAL LOW (ref 70–99)

## 2020-04-06 LAB — MAGNESIUM: Magnesium: 1.9 mg/dL (ref 1.7–2.4)

## 2020-04-06 LAB — SODIUM, URINE, RANDOM: Sodium, Ur: 43 mmol/L

## 2020-04-06 LAB — CREATININE, URINE, RANDOM: Creatinine, Urine: 99.33 mg/dL

## 2020-04-06 MED ORDER — POTASSIUM CHLORIDE 10 MEQ/100ML IV SOLN
INTRAVENOUS | Status: AC
  Filled 2020-04-06: qty 100

## 2020-04-06 MED ORDER — PANTOPRAZOLE SODIUM 40 MG PO TBEC
40.0000 mg | DELAYED_RELEASE_TABLET | Freq: Every day | ORAL | Status: DC
  Administered 2020-04-06 – 2020-04-08 (×3): 40 mg via ORAL
  Filled 2020-04-06 (×3): qty 1

## 2020-04-06 MED ORDER — UMECLIDINIUM BROMIDE 62.5 MCG/INH IN AEPB
1.0000 | INHALATION_SPRAY | Freq: Every day | RESPIRATORY_TRACT | Status: DC
  Filled 2020-04-06: qty 7

## 2020-04-06 MED ORDER — OXYCODONE HCL 5 MG PO TABS
5.0000 mg | ORAL_TABLET | ORAL | Status: DC | PRN
  Administered 2020-04-06 – 2020-04-08 (×2): 5 mg via ORAL
  Filled 2020-04-06 (×2): qty 1

## 2020-04-06 MED ORDER — CARVEDILOL 25 MG PO TABS
25.0000 mg | ORAL_TABLET | Freq: Two times a day (BID) | ORAL | Status: DC
  Administered 2020-04-06 – 2020-04-08 (×5): 25 mg via ORAL
  Filled 2020-04-06 (×5): qty 1

## 2020-04-06 MED ORDER — NICOTINE 14 MG/24HR TD PT24
14.0000 mg | MEDICATED_PATCH | Freq: Every day | TRANSDERMAL | Status: DC
  Administered 2020-04-06 – 2020-04-08 (×3): 14 mg via TRANSDERMAL
  Filled 2020-04-06 (×3): qty 1

## 2020-04-06 MED ORDER — GABAPENTIN 400 MG PO CAPS
800.0000 mg | ORAL_CAPSULE | Freq: Three times a day (TID) | ORAL | Status: DC
  Administered 2020-04-06 – 2020-04-08 (×7): 800 mg via ORAL
  Filled 2020-04-06 (×7): qty 2

## 2020-04-06 MED ORDER — ZOLPIDEM TARTRATE 5 MG PO TABS
5.0000 mg | ORAL_TABLET | Freq: Every day | ORAL | Status: DC
  Administered 2020-04-06 – 2020-04-07 (×2): 5 mg via ORAL
  Filled 2020-04-06 (×2): qty 1

## 2020-04-06 MED ORDER — ALBUTEROL SULFATE HFA 108 (90 BASE) MCG/ACT IN AERS
2.0000 | INHALATION_SPRAY | RESPIRATORY_TRACT | Status: DC | PRN
  Filled 2020-04-06: qty 6.7

## 2020-04-06 MED ORDER — DEXTROSE 50 % IV SOLN
12.5000 g | INTRAVENOUS | Status: AC
  Administered 2020-04-06: 12.5 g via INTRAVENOUS
  Filled 2020-04-06: qty 50

## 2020-04-06 MED ORDER — LORAZEPAM 2 MG/ML IJ SOLN
0.5000 mg | Freq: Three times a day (TID) | INTRAMUSCULAR | Status: DC | PRN
  Administered 2020-04-06 – 2020-04-08 (×6): 0.5 mg via INTRAVENOUS
  Filled 2020-04-06 (×6): qty 1

## 2020-04-06 MED ORDER — DEXTROSE 5 % IV SOLN: INTRAVENOUS | Status: DC

## 2020-04-06 MED ORDER — ASPIRIN 81 MG PO CHEW
81.0000 mg | CHEWABLE_TABLET | Freq: Every day | ORAL | Status: DC
  Administered 2020-04-06 – 2020-04-08 (×3): 81 mg via ORAL
  Filled 2020-04-06 (×3): qty 1

## 2020-04-06 MED ORDER — ROSUVASTATIN CALCIUM 10 MG PO TABS
10.0000 mg | ORAL_TABLET | Freq: Every day | ORAL | Status: DC
  Administered 2020-04-06 – 2020-04-07 (×2): 10 mg via ORAL
  Filled 2020-04-06 (×2): qty 1

## 2020-04-06 NOTE — Progress Notes (Incomplete)
Initial Nutrition Assessment  DOCUMENTATION CODES:      INTERVENTION:  ***   NUTRITION DIAGNOSIS:     related to   as evidenced by  .  ***  GOAL:      ***  MONITOR:      REASON FOR ASSESSMENT:   Malnutrition Screening Tool    ASSESSMENT:   75 y.o. male past medical history significant for COPD with ongoing tobacco abuse coronary artery disease/MI status post stent placement on aspirin, depression anxiety, GERD, chronic back pain history of laparoscopic partial cholecystectomy with drain placement on 04/28/2017 for gangrenous purulent cholecystitis, also history of exploratory laparotomy with bowel resection and primary repair of small bowel serosal tear as well complete cholecystectomy on 07/18/2017 per Dr. Redmond Pulling, with a recent hospitalization in January 2022 for small bowel obstruction will resolve with conservative treatment comes into the ED with worsening periumbilical abdominal pain due to possibly small bowel obstruction.  ***   NUTRITION - FOCUSED PHYSICAL EXAM:  {RD Focused Exam List:21252}  Diet Order:   Diet Order            Diet NPO time specified Except for: Sips with Meds, Ice Chips  Diet effective now                 EDUCATION NEEDS:      Skin:     Last BM:     Height:   Ht Readings from Last 1 Encounters:  04/05/20 5\' 10"  (1.778 m)    Weight:   Wt Readings from Last 1 Encounters:  04/05/20 61.2 kg    Ideal Body Weight:     BMI:  Body mass index is 19.37 kg/m.  Estimated Nutritional Needs:   Kcal:     Protein:     Fluid:       ***

## 2020-04-06 NOTE — Progress Notes (Signed)
Initial Nutrition Assessment  DOCUMENTATION CODES:   Severe malnutrition in context of chronic illness  INTERVENTION:   Once diet advanced: -Ensure Enlive po TID, each supplement provides 350 kcal and 20 grams of protein  -Multivitamin with minerals daily  NUTRITION DIAGNOSIS:   Severe Malnutrition related to chronic illness (recurrent SBOs) as evidenced by severe fat depletion,severe muscle depletion.  GOAL:   Patient will meet greater than or equal to 90% of their needs  MONITOR:   Diet advancement,Labs,Weight trends,I & O's  REASON FOR ASSESSMENT:   Malnutrition Screening Tool    ASSESSMENT:   75 y.o. male past medical history significant for COPD with ongoing tobacco abuse coronary artery disease/MI status post stent placement on aspirin, depression anxiety, GERD, chronic back pain history of laparoscopic partial cholecystectomy with drain placement on 04/28/2017 for gangrenous purulent cholecystitis, also history of exploratory laparotomy with bowel resection and primary repair of small bowel serosal tear as well complete cholecystectomy on 07/18/2017 per Dr. Redmond Pulling, with a recent hospitalization in January 2022 for small bowel obstruction will resolve with conservative treatment comes into the ED with worsening periumbilical abdominal pain due to possibly small bowel obstruction.  Patient in room, receiving meds from RN. Pt states he "eats great", typically 3-4 meals daily. Doesn't eat a lot of meat, main sources of protein are poultry and fish. States he has Ensure from the [place] but he hasn't been drinking them.   Pt with NGT set to suction. Output has been ~650 ml over the past 24 hours.   Pt states that he is concerned that he hasn't gained any weight. States he has lost ~5 lbs recently. Per weight records, pt with insignificant weight loss since January.  Will order Ensure supplements once diet is advanced. Pt denies nausea at this time.   Medications: D5 infusion, IV  Mg sulfate, IV KCl  Labs reviewed: CBGs: 58-118 Low Na   NUTRITION - FOCUSED PHYSICAL EXAM:  Flowsheet Row Most Recent Value  Orbital Region Mild depletion  Upper Arm Region Severe depletion  Thoracic and Lumbar Region Unable to assess  Buccal Region Moderate depletion  Temple Region Severe depletion  Clavicle Bone Region Severe depletion  Clavicle and Acromion Bone Region Severe depletion  Scapular Bone Region Moderate depletion  Dorsal Hand Severe depletion  Patellar Region Moderate depletion  Anterior Thigh Region Unable to assess  Posterior Calf Region Moderate depletion  Edema (RD Assessment) None  Hair Reviewed  Mouth --  [masked]       Diet Order:   Diet Order            Diet NPO time specified Except for: Sips with Meds, Ice Chips  Diet effective now                 EDUCATION NEEDS:   Education needs have been addressed  Skin:  Skin Assessment: Reviewed RN Assessment  Last BM:  4/5  Height:   Ht Readings from Last 1 Encounters:  04/05/20 5\' 10"  (1.778 m)    Weight:   Wt Readings from Last 1 Encounters:  04/05/20 61.2 kg   BMI:  Body mass index is 19.37 kg/m.  Estimated Nutritional Needs:   Kcal:  1900-2100  Protein:  90-105g  Fluid:  2.1L/day   Clayton Bibles, MS, RD, LDN Inpatient Clinical Dietitian Contact information available via Amion

## 2020-04-06 NOTE — Evaluation (Signed)
Physical Therapy Evaluation Patient Details Name: Gerald Farrell MRN: [id number] DOB: 13-Mar-1945 Today's Date: 04/06/2020   History of Present Illness  75 y.o. male PMH COPD with ongoing tobacco abuse, CAD/MI s/p  stent placement on aspirin, depression anxiety, GERD, chronic back pain history of laparoscopic partial cholecystectomy with drain placement on 04/28/2017 for gangrenous purulent cholecystitis, exploratory laparotomy with bowel resection and primary repair of small bowel serosal tear as well complete cholecystectomy on 07/18/2017 [place] with recent hospitalization in January 2022 for small bowel obstruction will resolve with conservative treatment comes into the ED with worsening periumbilical abdominal pain and small bowel obstruction, being treated conservatively.  Clinical Impression  Pt admitted with above diagnosis.  See below for mobility  Pt currently with functional limitations due to the deficits listed below (see PT Problem List). Pt will benefit from skilled PT to increase their independence and safety with mobility to allow discharge to the venue listed below.      Follow Up Recommendations No PT follow up    Equipment Recommendations  None recommended by PT    Recommendations for Other Services       Precautions / Restrictions Precautions Precautions: Fall Precaution Comments: NGT, currently clamped Restrictions Weight Bearing Restrictions: No      Mobility  Bed Mobility Overal bed mobility: Modified Independent                  Transfers Overall transfer level: Needs assistance Equipment used: Rolling walker (2 wheeled) Transfers: Sit to/from Stand Sit to Stand: Supervision         General transfer comment: for lines and safety, unsteady on intial standing  Ambulation/Gait Ambulation/Gait assistance: Supervision;Min guard Gait Distance (Feet): 280 Feet Assistive device: Rolling walker (2 wheeled) Gait Pattern/deviations: Step-through  pattern;Decreased stride length;Trunk flexed     General Gait Details: cues to stay inside RW, unsteady initially however improved with distance  Stairs            Wheelchair Mobility    Modified Rankin (Stroke Patients Only)       Balance   Sitting-balance support: No upper extremity supported;Feet supported Sitting balance-Leahy Scale: Good     Standing balance support: Single extremity supported Standing balance-Leahy Scale: Poor Standing balance comment: reliant on at least unilateral UE support                             Pertinent Vitals/Pain Pain Assessment: 0-10 Pain Score: 4  Pain Location: abdomen Pain Descriptors / Indicators: Sore Pain Intervention(s): Limited activity within patient's tolerance;Monitored during session    Home Living Family/patient expects to be discharged to:: Private residence Living Arrangements: Children Available Help at Discharge: Family;Available 24 hours/day Type of Home: House Home Access: Stairs to enter Entrance Stairs-Rails: None Entrance Stairs-Number of Steps: 2-3 Home Layout: One level Home Equipment: Clinical cytogeneticist - 2 wheels;Cane - single point;Other (comment)      Prior Function Level of Independence: Independent with assistive device(s)         Comments: Pt reports independent with home and community ambulation using RW or 3wheeled walker, holds onto shopping cart in store, drives, independent with ADLs. Son present to assist with dressing as needed.     Hand Dominance        Extremity/Trunk Assessment   Upper Extremity Assessment Upper Extremity Assessment: Defer to OT evaluation    Lower Extremity Assessment Lower Extremity Assessment: Generalized weakness;LLE deficits/detail LLE Deficits / Details: AROM  WFL, pt reported "catch", clicking with flexion       Communication   Communication: No difficulties  Cognition Arousal/Alertness: Awake/alert Behavior During Therapy: WFL  for tasks assessed/performed Overall Cognitive Status: Within Functional Limits for tasks assessed                                        General Comments      Exercises     Assessment/Plan    PT Assessment Patient needs continued PT services  PT Problem List Decreased strength;Decreased mobility;Decreased balance       PT Treatment Interventions DME instruction;Therapeutic activities;Gait training;Functional mobility training;Therapeutic exercise;Patient/family education;Balance training    PT Goals (Current goals can be found in the Care Plan section)  Acute Rehab PT Goals Patient Stated Goal: feel better, not have to have surgery PT Goal Formulation: With patient Time For Goal Achievement: 04/20/20 Potential to Achieve Goals: Good    Frequency Min 2X/week   Barriers to discharge        Co-evaluation               AM-PAC PT "6 Clicks" Mobility  Outcome Measure Help needed turning from your back to your side while in a flat bed without using bedrails?: None Help needed moving from lying on your back to sitting on the side of a flat bed without using bedrails?: None Help needed moving to and from a bed to a chair (including a wheelchair)?: A Little Help needed standing up from a chair using your arms (e.g., wheelchair or bedside chair)?: A Little Help needed to walk in hospital room?: A Little Help needed climbing 3-5 steps with a railing? : A Little 6 Click Score: 20    End of Session   Activity Tolerance: Patient tolerated treatment well Patient left: in bed;with call bell/phone within reach;with bed alarm set Nurse Communication: Mobility status PT Visit Diagnosis: Difficulty in walking, not elsewhere classified (R26.2)    Time: 1419-1430 PT Time Calculation (min) (ACUTE ONLY): 11 min   Charges:   PT Evaluation $PT Eval Low Complexity: 1 Low          Drae Mitzel, PT  Acute Rehab Dept ([place]) [phone] Pager  [phone]  04/06/2020   [facility] 04/06/2020, 5:02 PM

## 2020-04-06 NOTE — Progress Notes (Signed)
[facility] Progress Note     Subjective: CC-  Overall feeling much better. States that pain was 10/10 when he came in, abdominal pain is now 4/10. He did still require some pain medication this morning. Denies n/v. Passing some flatus and he had a couple small, loose stools yesterday. NG tube with 650cc output since placement.  Follow up film from SBO protocol shows dilute contrast in the small bowel, no contrast in the colon, persistent small bowel obstruction. WBC 8.8, VSS.  Objective: Vital signs in last 24 hours: Temp:  [97.4 F (36.3 C)-98.7 F (37.1 C)] 97.4 F (36.3 C) (04/06 0938) Pulse Rate:  [60-88] 70 (04/06 0938) Resp:  [10-22] 17 (04/06 0938) BP: (132-201)/(73-145) 133/89 (04/06 0938) SpO2:  [93 %-100 %] 99 % (04/06 0938) Weight:  [61.2 kg] 61.2 kg (04/05 2256) Last BM Date: 04/05/20  Intake/Output from previous day: 04/05 0701 - 04/06 0700 In: 6494.9 [I.V.:1667.4; IV Piggyback:4827.5] Out: 1200 [Urine:550; Emesis/NG output:650] Intake/Output this shift: No intake/output data recorded.  PE: Gen:  Alert, NAD, pleasant Pulm:  rate and effort normal Abd: well healed midline and laparoscopic incisions, soft, minimal distension with mild periumbilical TTP, no peritonitis. +BS, no masses, hernias, or organomegaly  Skin: no rashes noted, warm and dry  Lab Results:  Recent Labs    04/05/20 0932 04/06/20 0421  WBC 6.3 8.8  HGB 12.4* 13.5  HCT 38.4* 40.3  PLT 189 157   BMET Recent Labs    04/05/20 2054 04/06/20 0421  NA 134* 134*  K 3.2* 4.3  CL 104 106  CO2 24 21*  GLUCOSE 81 73  BUN 12 10  CREATININE 0.61 0.64  CALCIUM 8.1* 7.8*   PT/INR No results for input(s): LABPROT, INR in the last 72 hours. CMP     Component Value Date/Time   NA 134 (L) 04/06/2020 0421   K 4.3 04/06/2020 0421   CL 106 04/06/2020 0421   CO2 21 (L) 04/06/2020 0421   GLUCOSE 73 04/06/2020 0421   BUN 10 04/06/2020 0421   CREATININE 0.64 04/06/2020 0421    CALCIUM 7.8 (L) 04/06/2020 0421   PROT 4.6 (L) 04/06/2020 0421   ALBUMIN 2.5 (L) 04/06/2020 0421   AST 23 04/06/2020 0421   ALT 11 04/06/2020 0421   ALKPHOS 44 04/06/2020 0421   BILITOT 1.0 04/06/2020 0421   GFRNONAA >60 04/06/2020 0421   GFRAA >60 10/18/2017 1937   Lipase     Component Value Date/Time   LIPASE 26 04/05/2020 0932       Studies/Results: CT ABDOMEN PELVIS WO CONTRAST  Result Date: 04/05/2020 CLINICAL DATA:  Suspected bowel obstruction in a 75 year old male. EXAM: CT ABDOMEN AND PELVIS WITHOUT CONTRAST TECHNIQUE: Multidetector CT imaging of the abdomen and pelvis was performed following the standard protocol without IV contrast. COMPARISON:  Previous CT from January of 2022 FINDINGS: Lower chest: Patchy atelectasis and some ground-glass at the RIGHT lung base and to a lesser extent on the LEFT. No effusion. No lobar consolidative changes. Hepatobiliary: No focal, suspicious hepatic lesion on noncontrast imaging. Similar contour of the liver compared to prior evaluations. Post cholecystectomy. Pancreas: Pancreas with atrophy as before. Spleen: Spleen normal size and contour. Adrenals/Urinary Tract: Adrenal glands are normal. Kidneys with smooth contours. LEFT renal cyst unchanged. No hydronephrosis. Urinary bladder with smooth contours. No nephrolithiasis. No ureteral calculi. Stomach/Bowel: Distended stomach. Duodenum crosses midline in the dilated loops of bowel which extend to the RIGHT. Small bowel anastomosis in the RIGHT hemiabdomen with  bowel dilate chin reaching the anastomotic level and extending beyond the level of the anastomosis into the bowel as it again crosses the midline markedly decompressed at the level of bowel transition. Distortion of the central abdominal mesentery. Extensive edema within the mesenteric structures of collapsed bowel loops in the LEFT hemiabdomen. Transition occurs at the root of the small bowel mesentery best seen on image 66 of series 4. There  is distortion of the terminal ileum as it passes through the area of bowel transition. Extensive mesenteric edema. Moderate volume ascites in the pelvis. No pneumatosis. Small bowel distension reaches 5 cm proximal to the anastomotic site which is also proximal to the site of bowel caliber change. Vascular/Lymphatic: Calcified atheromatous plaque in the abdominal aorta. There is no gastrohepatic or hepatoduodenal ligament lymphadenopathy. No retroperitoneal or mesenteric lymphadenopathy. No pelvic sidewall lymphadenopathy. Reproductive: Prostate with some heterogeneity. Other: Moderate volume ascites in the pelvis. Musculoskeletal: Spinal degenerative changes. No acute or destructive bone finding. IMPRESSION: 1. Markedly dilated small bowel with transition in the mid abdomen at the root of the small bowel mesentery with extensive mesenteric edema and ascites suspicious for high-grade small bowel obstruction due to internal hernia and or dense adhesions at the root of the small bowel mesentery. Mesenteric edema and ascites is concerning and marked and involves decompressed loops in the LEFT hemiabdomen. Correlation with lactate to assess for any signs of ischemia is suggested. No current pneumatosis. 2. Patchy atelectasis and some ground-glass at the RIGHT lung base and to a lesser extent on the LEFT. Findings may be related atelectasis. Correlate with any risk factors for aspiration. 3. Aortic atherosclerosis. These results were called by telephone at the time of interpretation on 04/05/2020 at 2:41 pm to provider Carmin Muskrat , who verbally acknowledged these results. Aortic Atherosclerosis (ICD10-I70.0). Electronically Signed   By: Zetta Bills M.D.   On: 04/05/2020 14:41   DG Abd Portable 1V-Small Bowel Obstruction Protocol-initial, 8 hr delay  Result Date: 04/06/2020 CLINICAL DATA:  Small-bowel protocol.  8 hours post contrast film. EXAM: PORTABLE ABDOMEN - 1 VIEW COMPARISON:  04/05/2020 FINDINGS: Dilated  gas-filled small bowel. Small amount of gas in the colon. Dilute contrast is suggested in the small bowel. No definite contrast material in the colon. Changes consistent with high-grade small bowel obstruction. Enteric tube with tip in the left upper quadrant consistent with location in the upper stomach. Degenerative changes in the lumbar spine. Lumbar scoliosis convex towards the right. IMPRESSION: Dilute contrast suggested in the small bowel. No contrast material identified in the colon. Changes consistent with high-grade small bowel obstruction. Electronically Signed   By: Lucienne Capers M.D.   On: 04/06/2020 03:01   DG Abd Portable 1V-Small Bowel Protocol-Position Verification  Result Date: 04/05/2020 CLINICAL DATA:  75 year old male status post NG placement. EXAM: PORTABLE ABDOMEN - 1 VIEW COMPARISON:  CT abdomen pelvis dated 04/05/2020. FINDINGS: Enteric tube with side-port in the proximal stomach and tip in the body of the stomach. Dilated small bowel loop in the mid abdomen measures up to 6.1 cm in caliber. IMPRESSION: 1. Enteric tube with tip in the body of the stomach. 2. Dilated small bowel loop in the mid abdomen. Electronically Signed   By: Anner Crete M.D.   On: 04/05/2020 17:12    Anti-infectives: Anti-infectives (From admission, onward)   None       Assessment/Plan COPD CAD/ MI ~2020 requiring stent placement - takes ASA 81mg  daily HTN GERD Anxiety Chronic back pain - takes gabapentin and  oxycodone 5mg  4-5x daily Hx alcohol dependence >20 years ago Tobacco abuse - had quit for several years and recently relapsed, smoking 1/2 PPD Acute renal failure - Cr 6.66 on admission and down to 0.64, suspect this may have been a lab error  SBO, possible internal hernia - PSH:  laparoscopic partial cholecystectomy with drain placement 04/28/2017 by Dr. Rosendo Gros for gangrenous/ purulent cholecystitis; Exploratory laparotomy with small bowel resection and primary repair of small  bowel serosal tear as well as completion cholecystectomy 07/18/2017 by Dr. Redmond Pulling - recent admission for SBO 01/06/20 through 01/12/20 for SBO that resolved with conservative management - CT scan 4/5 showed markedly dilated small bowel with transition in the mid abdomen at the root of the small bowel mesentery with extensive mesenteric edema and ascites suspicious for high-grade small bowel obstruction due to internal hernia and or dense adhesions at the root of the small bowel mesentery; no current pneumatosis - WBC and lactic acid WNL, VSS  ID - none VTE - ok for chemical DVT prophylaxis from surgical standpoint FEN - IVF, NPO/NGT to LIWS Foley - none Follow up - TBD  Plan: Follow up film from SBO protocol shows dilute contrast in the small bowel, no contrast in the colon, persistent small bowel obstruction. Patient starting to have some return in bowel function, pain improving but not resolved. Exam reassuring. Will continue NG tube to LIWS today. Repeat film in AM.    LOS: 1 day    Wellington Hampshire, [facility] 04/06/2020, 9:50 AM Please see Amion for pager number during day hours 7:00am-4:30pm

## 2020-04-06 NOTE — Progress Notes (Signed)
[facility] PROGRESS NOTE    Progress Note  Gerald Farrell  WCB:[id number] DOB: 1945/07/23 DOA: 04/05/2020 PCP: [facility]     Brief Narrative:   Gerald Farrell is an 75 y.o. male past medical history significant for COPD with ongoing tobacco abuse coronary artery disease/MI status post stent placement on aspirin, depression anxiety, GERD, chronic back pain history of laparoscopic partial cholecystectomy with drain placement on 04/28/2017 for gangrenous purulent cholecystitis, also history of exploratory laparotomy with bowel resection and primary repair of small bowel serosal tear as well complete cholecystectomy on 07/18/2017 per Dr. Redmond Pulling, with a recent hospitalization in January 2022 for small bowel obstruction will resolve with conservative treatment comes into the ED with worsening periumbilical abdominal pain due to possibly small bowel obstruction.   Significant studies: 04/05/2020 CT scan of the abdomen pelvis showed markedly dilated small bowel with edema suspicious for high-grade bowel obstruction.  Patchy atelectasis with some groundglass at the right lung base. 04/05/2020 abdominal x-ray show enteric tube at the tip of the stomach multiple dilated small bowel in the midabdomen. 04/06/2020 abdominal x-ray contrast in the small bowel none in the colon consistent with high-grade bowel obstruction. Marland Kitchen Antibiotics: None  Microbiology data: Blood culture:  Procedures: None  Assessment/Plan:   High-grade SBO (small bowel obstruction) ([place]) NG tube in place to intermittent suction, surgery has been consulted. Avoid narcotics continue IV fluids and replete electrolytes.  Try to keep magnesium greater than 2 potassium greater than 4. Abdominal exam is unremarkable Very minimal output through the NG tube Out of bed to chair ambulate patient. Clamp NG tube allow ice chips and sips with meds. Further management per general surgery.  Acute kidney injury: With a  baseline creatinine of 0.7 on admission 6.6, likely prerenal azotemia in the setting of ACE inhibitor and diuretic use. Started on IV fluids and his creatinine has returned to baseline.  Hypovolemic hyponatremia: Improving with IV fluid hydration continue.  Essential hypertension: Hold all antihypertensive medication continue pressor 5 mg IV every 8 and hydralazine IV as needed. Blood pressure slightly elevated continue to monitor.  COPD with ongoing tobacco abuse: Continue inhalers have been counseled.  Severe protein caloric malnutrition: N.p.o. secondary to high-grade small bowel obstruction.   Anxiety: Continue Ativan.  Chronic pain: Continue IV Dilaudid as needed.  Coronary artery disease: Lopressor scheduled continue aspirin hold ACE inhibitor.   Hypokalemia: Repleted now improved.  Sacral decubitus ulcer stage II present on admission.   RN Pressure Injury Documentation: Pressure Injury 01/07/20 Sacrum Right;Upper Stage 2 -  Partial thickness loss of dermis presenting as a shallow open injury with a red, pink wound bed without slough. (Active)  01/07/20 0145  Location: Sacrum  Location Orientation: Right;Upper  Staging: Stage 2 -  Partial thickness loss of dermis presenting as a shallow open injury with a red, pink wound bed without slough.  Wound Description (Comments):   Present on Admission: Yes   Severe protein caloric malnutrition: Estimated body mass index is 19.37 kg/m as calculated from the following:   Height as of this encounter: 5\' 10"  (1.778 m).   Weight as of this encounter: 61.2 kg.    DVT prophylaxis: lovenxo Family Communication:none Status is: Inpatient  Remains inpatient appropriate because:Hemodynamically unstable   Dispo: The patient is from: Home              Anticipated d/c is to: Home              Patient currently is not  medically stable to d/c.   Difficult to place patient No        Code Status:     Code Status Orders   (From admission, onward)         Start     Ordered   04/05/20 1833  Full code  Continuous        04/05/20 1835        Code Status History    Date Active Date Inactive Code Status Order ID Comments User Context   01/06/2020 1757 01/12/2020 1900 Full Code [id number]  Michael Boston, MD ED   07/14/2017 0935 08/02/2017 1509 Full Code [id number]  Greer Pickerel, MD ED   04/28/2017 1103 05/02/2017 1528 Full Code [id number]  Ralene Ok, MD Inpatient   04/27/2017 0538 04/28/2017 1103 Full Code [id number]  Ralene Ok, MD ED   Advance Care Planning Activity        IV Access:    Peripheral IV   Procedures and diagnostic studies:   CT ABDOMEN PELVIS WO CONTRAST  Result Date: 04/05/2020 CLINICAL DATA:  Suspected bowel obstruction in a 75 year old male. EXAM: CT ABDOMEN AND PELVIS WITHOUT CONTRAST TECHNIQUE: Multidetector CT imaging of the abdomen and pelvis was performed following the standard protocol without IV contrast. COMPARISON:  Previous CT from January of 2022 FINDINGS: Lower chest: Patchy atelectasis and some ground-glass at the RIGHT lung base and to a lesser extent on the LEFT. No effusion. No lobar consolidative changes. Hepatobiliary: No focal, suspicious hepatic lesion on noncontrast imaging. Similar contour of the liver compared to prior evaluations. Post cholecystectomy. Pancreas: Pancreas with atrophy as before. Spleen: Spleen normal size and contour. Adrenals/Urinary Tract: Adrenal glands are normal. Kidneys with smooth contours. LEFT renal cyst unchanged. No hydronephrosis. Urinary bladder with smooth contours. No nephrolithiasis. No ureteral calculi. Stomach/Bowel: Distended stomach. Duodenum crosses midline in the dilated loops of bowel which extend to the RIGHT. Small bowel anastomosis in the RIGHT hemiabdomen with bowel dilate chin reaching the anastomotic level and extending beyond the level of the anastomosis into the bowel as it again crosses the midline markedly decompressed  at the level of bowel transition. Distortion of the central abdominal mesentery. Extensive edema within the mesenteric structures of collapsed bowel loops in the LEFT hemiabdomen. Transition occurs at the root of the small bowel mesentery best seen on image 66 of series 4. There is distortion of the terminal ileum as it passes through the area of bowel transition. Extensive mesenteric edema. Moderate volume ascites in the pelvis. No pneumatosis. Small bowel distension reaches 5 cm proximal to the anastomotic site which is also proximal to the site of bowel caliber change. Vascular/Lymphatic: Calcified atheromatous plaque in the abdominal aorta. There is no gastrohepatic or hepatoduodenal ligament lymphadenopathy. No retroperitoneal or mesenteric lymphadenopathy. No pelvic sidewall lymphadenopathy. Reproductive: Prostate with some heterogeneity. Other: Moderate volume ascites in the pelvis. Musculoskeletal: Spinal degenerative changes. No acute or destructive bone finding. IMPRESSION: 1. Markedly dilated small bowel with transition in the mid abdomen at the root of the small bowel mesentery with extensive mesenteric edema and ascites suspicious for high-grade small bowel obstruction due to internal hernia and or dense adhesions at the root of the small bowel mesentery. Mesenteric edema and ascites is concerning and marked and involves decompressed loops in the LEFT hemiabdomen. Correlation with lactate to assess for any signs of ischemia is suggested. No current pneumatosis. 2. Patchy atelectasis and some ground-glass at the RIGHT lung base and to a lesser extent on the LEFT. Findings  may be related atelectasis. Correlate with any risk factors for aspiration. 3. Aortic atherosclerosis. These results were called by telephone at the time of interpretation on 04/05/2020 at 2:41 pm to provider Carmin Muskrat , who verbally acknowledged these results. Aortic Atherosclerosis (ICD10-I70.0). Electronically Signed   By:  Zetta Bills M.D.   On: 04/05/2020 14:41   DG Abd Portable 1V-Small Bowel Obstruction Protocol-initial, 8 hr delay  Result Date: 04/06/2020 CLINICAL DATA:  Small-bowel protocol.  8 hours post contrast film. EXAM: PORTABLE ABDOMEN - 1 VIEW COMPARISON:  04/05/2020 FINDINGS: Dilated gas-filled small bowel. Small amount of gas in the colon. Dilute contrast is suggested in the small bowel. No definite contrast material in the colon. Changes consistent with high-grade small bowel obstruction. Enteric tube with tip in the left upper quadrant consistent with location in the upper stomach. Degenerative changes in the lumbar spine. Lumbar scoliosis convex towards the right. IMPRESSION: Dilute contrast suggested in the small bowel. No contrast material identified in the colon. Changes consistent with high-grade small bowel obstruction. Electronically Signed   By: Lucienne Capers M.D.   On: 04/06/2020 03:01   DG Abd Portable 1V-Small Bowel Protocol-Position Verification  Result Date: 04/05/2020 CLINICAL DATA:  75 year old male status post NG placement. EXAM: PORTABLE ABDOMEN - 1 VIEW COMPARISON:  CT abdomen pelvis dated 04/05/2020. FINDINGS: Enteric tube with side-port in the proximal stomach and tip in the body of the stomach. Dilated small bowel loop in the mid abdomen measures up to 6.1 cm in caliber. IMPRESSION: 1. Enteric tube with tip in the body of the stomach. 2. Dilated small bowel loop in the mid abdomen. Electronically Signed   By: Anner Crete M.D.   On: 04/05/2020 17:12     Medical Consultants:    None.   Subjective:    Gerald Farrell relates his abdominal pain is better had 2 bowel movements overnight, wants to ambulate.  Objective:    Vitals:   04/05/20 2026 04/05/20 2256 04/06/20 0108 04/06/20 0443  BP: (!) 158/100  140/78 132/90  Pulse: 79  78 74  Resp: 16  17 17   Temp: 98.7 F (37.1 C)  97.6 F (36.4 C) 98.2 F (36.8 C)  TempSrc: Oral  Oral Oral  SpO2: 99%  93% 96%   Weight:  61.2 kg    Height:  5\' 10"  (1.778 m)     SpO2: 96 %   Intake/Output Summary (Last 24 hours) at 04/06/2020 0749 Last data filed at 04/06/2020 0600 Gross per 24 hour  Intake 6494.94 ml  Output 1200 ml  Net 5294.94 ml   Filed Weights   04/05/20 0926 04/05/20 2256  Weight: 62 kg 61.2 kg    Exam: General exam: In no acute distress, cachectic Respiratory system: Good air movement and clear to auscultation. Cardiovascular system: S1 & S2 heard, RRR. No JVD. Gastrointestinal system: Abdomen is nondistended, soft and nontender.  Extremities: No pedal edema. Skin: No rashes, lesions or ulcers Psychiatry: Judgement and insight appear normal. Mood & affect appropriate.    Data Reviewed:    Labs: Basic Metabolic Panel: Recent Labs  Lab 04/05/20 0932 04/05/20 2054 04/06/20 0421  NA 132* 134* 134*  K 3.3* 3.2* 4.3  CL 90* 104 106  CO2 30 24 21*  GLUCOSE 281* 81 73  BUN 10 12 10   CREATININE 6.66* 0.61 0.64  CALCIUM 8.6* 8.1* 7.8*  MG  --  1.5* 1.9  PHOS  --  3.4  --    GFR Estimated Creatinine  Clearance: 72.3 mL/min (by C-G formula based on SCr of 0.64 mg/dL). Liver Function Tests: Recent Labs  Lab 04/05/20 0932 04/05/20 2054 04/06/20 0421  AST 28  --  23  ALT 14  --  11  ALKPHOS 114  --  44  BILITOT 1.0  --  1.0  PROT 7.8  --  4.6*  ALBUMIN 3.8 2.8* 2.5*   Recent Labs  Lab 04/05/20 0932  LIPASE 26   No results for input(s): AMMONIA in the last 168 hours. Coagulation profile No results for input(s): INR, PROTIME in the last 168 hours. COVID-19 Labs  No results for input(s): DDIMER, FERRITIN, LDH, CRP in the last 72 hours.  Lab Results  Component Value Date   SARSCOV2NAA NEGATIVE 04/05/2020   [place] NEGATIVE 01/06/2020    CBC: Recent Labs  Lab 04/05/20 0932 04/06/20 0421  WBC 6.3 8.8  NEUTROABS 4.7  --   HGB 12.4* 13.5  HCT 38.4* 40.3  MCV 84.8 96.4  PLT 189 157   Cardiac Enzymes: No results for input(s): CKTOTAL, CKMB,  CKMBINDEX, TROPONINI in the last 168 hours. BNP (last 3 results) No results for input(s): PROBNP in the last 8760 hours. CBG: Recent Labs  Lab 04/06/20 0738  GLUCAP 58*   D-Dimer: No results for input(s): DDIMER in the last 72 hours. Hgb A1c: No results for input(s): HGBA1C in the last 72 hours. Lipid Profile: No results for input(s): CHOL, HDL, LDLCALC, TRIG, CHOLHDL, LDLDIRECT in the last 72 hours. Thyroid function studies: No results for input(s): TSH, T4TOTAL, T3FREE, THYROIDAB in the last 72 hours.  Invalid input(s): FREET3 Anemia work up: No results for input(s): VITAMINB12, FOLATE, FERRITIN, TIBC, IRON, RETICCTPCT in the last 72 hours. Sepsis Labs: Recent Labs  Lab 04/05/20 0932 04/05/20 1429 04/06/20 0421  WBC 6.3  --  8.8  LATICACIDVEN  --  0.7  --    Microbiology Recent Results (from the past 240 hour(s))  Resp Panel by RT-PCR (Flu A&B, Covid) Nasopharyngeal Swab     Status: None   Collection Time: 04/05/20  3:43 PM   Specimen: Nasopharyngeal Swab; Nasopharyngeal(NP) swabs in vial transport medium  Result Value Ref Range Status   SARS Coronavirus 2 by RT PCR NEGATIVE NEGATIVE Final    Comment: (NOTE) SARS-CoV-2 target nucleic acids are NOT DETECTED.  The SARS-CoV-2 RNA is generally detectable in upper respiratory specimens during the acute phase of infection. The lowest concentration of SARS-CoV-2 viral copies this assay can detect is 138 copies/mL. A negative result does not preclude SARS-Cov-2 infection and should not be used as the sole basis for treatment or other patient management decisions. A negative result may occur with  improper specimen collection/handling, submission of specimen other than nasopharyngeal swab, presence of viral mutation(s) within the areas targeted by this assay, and inadequate number of viral copies(<138 copies/mL). A negative result must be combined with clinical observations, patient history, and  epidemiological information. The expected result is Negative.  Fact Sheet for Patients:  EntrepreneurPulse.com.au  Fact Sheet for Healthcare Providers:  IncredibleEmployment.be  This test is no t yet approved or cleared by the [place] FDA and  has been authorized for detection and/or diagnosis of SARS-CoV-2 by FDA under an Emergency Use Authorization (EUA). This EUA will remain  in effect (meaning this test can be used) for the duration of the COVID-19 declaration under Section 564(b)(1) of the Act, 21 U.S.C.section 360bbb-3(b)(1), unless the authorization is terminated  or revoked sooner.       Influenza  A by PCR NEGATIVE NEGATIVE Final   Influenza B by PCR NEGATIVE NEGATIVE Final    Comment: (NOTE) The Xpert Xpress SARS-CoV-2/FLU/RSV plus assay is intended as an aid in the diagnosis of influenza from Nasopharyngeal swab specimens and should not be used as a sole basis for treatment. Nasal washings and aspirates are unacceptable for Xpert Xpress SARS-CoV-2/FLU/RSV testing.  Fact Sheet for Patients: EntrepreneurPulse.com.au  Fact Sheet for Healthcare Providers: IncredibleEmployment.be  This test is not yet approved or cleared by the [place] FDA and has been authorized for detection and/or diagnosis of SARS-CoV-2 by FDA under an Emergency Use Authorization (EUA). This EUA will remain in effect (meaning this test can be used) for the duration of the COVID-19 declaration under Section 564(b)(1) of the Act, 21 U.S.C. section 360bbb-3(b)(1), unless the authorization is terminated or revoked.  Performed at [facility], [place] [address] [place], [place] [address]      Medications:   . dextrose  12.5 g Intravenous STAT  . metoprolol tartrate  5 mg Intravenous Q8H  . mometasone-formoterol  2 puff Inhalation BID  . nicotine  21 mg Transdermal Q24H  . pantoprazole (PROTONIX)  IV  40 mg Intravenous Q24H  . sodium chloride flush  3 mL Intravenous Q12H  . umeclidinium bromide  1 puff Inhalation Daily   Continuous Infusions: . sodium chloride 125 mL/hr at 04/06/20 0524  . sodium chloride        LOS: 1 day   Charlynne Cousins  [facility]  04/06/2020, 7:49 AM

## 2020-04-06 NOTE — Evaluation (Signed)
Occupational Therapy Evaluation Patient Details Name: Gerald Farrell MRN: [id number] DOB: January 04, 1945 Today's Date: 04/06/2020    History of Present Illness 75 y.o. male PMH COPD with ongoing tobacco abuse, CAD/MI s/p  stent placement on aspirin, depression anxiety, GERD, chronic back pain history of laparoscopic partial cholecystectomy with drain placement on 04/28/2017 for gangrenous purulent cholecystitis, exploratory laparotomy with bowel resection and primary repair of small bowel serosal tear as well complete cholecystectomy on 07/18/2017 [place] with recent hospitalization in January 2022 for small bowel obstruction will resolve with conservative treatment comes into the ED with worsening periumbilical abdominal pain and small bowel obstruction, being treated conservatively.   Clinical Impression   Patient and son live together in a single level house with 2-3 steps to enter. Son works from home. At baseline patient is independent with self care, uses cane or walker as needed for ambulation. Patient demonstrates lower body dressing, functional transfers and ambulation with walker without physical assistance and no loss of balance noted. Patient denies any concerns/needs from OT, will discontinue acute OT services at this time. Please re-consult if new needs arise.    Follow Up Recommendations  No OT follow up    Equipment Recommendations  None recommended by OT       Precautions / Restrictions Precautions Precaution Comments: NG clamped Restrictions Weight Bearing Restrictions: No      Mobility Bed Mobility Overal bed mobility: Modified Independent                  Transfers Overall transfer level: Modified independent Equipment used: Rolling walker (2 wheeled)                  Balance Overall balance assessment: Mild deficits observed, not formally tested                                         ADL either performed or assessed with clinical  judgement   ADL Overall ADL's : Modified independent                                       General ADL Comments: patient demonstrates LB dressing, functional transfers and ambulation with rolling walker without any physical assistance. patient reports being up to bedside commode a few times this morning without needed assistance                  Pertinent Vitals/Pain Pain Assessment: 0-10 Pain Score: 5  Pain Location: abdomen Pain Descriptors / Indicators: Sore Pain Intervention(s): Limited activity within patient's tolerance     Hand Dominance Right   Extremity/Trunk Assessment Upper Extremity Assessment Upper Extremity Assessment: Overall WFL for tasks assessed   Lower Extremity Assessment Lower Extremity Assessment: Defer to PT evaluation   Cervical / Trunk Assessment Cervical / Trunk Assessment: Normal   Communication Communication Communication: No difficulties   Cognition Arousal/Alertness: Awake/alert Behavior During Therapy: WFL for tasks assessed/performed Overall Cognitive Status: Within Functional Limits for tasks assessed                                                Home Living Family/patient expects to be discharged to:: Private residence Living  Arrangements: Children (son) Available Help at Discharge: Family;Available 24 hours/day (son works from home) Type of Home: House Home Access: Stairs to enter [employer] of Steps: 2-3 Entrance Stairs-Rails: None Home Layout: One level     Bathroom Shower/Tub: Teacher, early years/pre: [place]: Clinical cytogeneticist - 2 wheels;Cane - single point;Other (comment) (3 wheeled walker)          Prior Functioning/Environment Level of Independence: Independent with assistive device(s)        Comments: Pt reports independent with home and community ambulation using RW or 3wheeled walker, holds onto shopping cart in store, drives,  independent with ADLs. Son present to assist with dressing as needed.        OT Problem List: Pain         OT Goals(Current goals can be found in the care plan section) Acute Rehab OT Goals Patient Stated Goal: feel better OT Goal Formulation: All assessment and education complete, DC therapy      AM-PAC OT "6 Clicks" Daily Activity     Outcome Measure Help from another person eating meals?: None Help from another person taking care of personal grooming?: None Help from another person toileting, which includes using toliet, bedpan, or urinal?: None Help from another person bathing (including washing, rinsing, drying)?: None Help from another person to put on and taking off regular upper body clothing?: None Help from another person to put on and taking off regular lower body clothing?: None 6 Click Score: 24   End of Session Equipment Utilized During Treatment: Rolling walker Nurse Communication: Mobility status  Activity Tolerance: Patient tolerated treatment well Patient left: in chair;with call bell/phone within reach  OT Visit Diagnosis: Pain Pain - part of body:  (abdomen)                Time: 0240-9735 OT Time Calculation (min): 17 min Charges:  OT General Charges $OT Visit: 1 Visit OT Evaluation $OT Eval Low Complexity: 1 Low  Gerald Farrell OT OT pager: [place] 04/06/2020, 11:23 AM

## 2020-04-07 ENCOUNTER — Inpatient Hospital Stay (HOSPITAL_COMMUNITY): Payer: No Typology Code available for payment source

## 2020-04-07 DIAGNOSIS — N179 Acute kidney failure, unspecified: Secondary | ICD-10-CM | POA: Diagnosis not present

## 2020-04-07 DIAGNOSIS — K56609 Unspecified intestinal obstruction, unspecified as to partial versus complete obstruction: Secondary | ICD-10-CM | POA: Diagnosis not present

## 2020-04-07 DIAGNOSIS — E876 Hypokalemia: Secondary | ICD-10-CM | POA: Diagnosis not present

## 2020-04-07 DIAGNOSIS — J449 Chronic obstructive pulmonary disease, unspecified: Secondary | ICD-10-CM | POA: Diagnosis not present

## 2020-04-07 LAB — GLUCOSE, CAPILLARY: Glucose-Capillary: 108 mg/dL — ABNORMAL HIGH (ref 70–99)

## 2020-04-07 MED ORDER — POTASSIUM CHLORIDE CRYS ER 10 MEQ PO TBCR
20.0000 meq | EXTENDED_RELEASE_TABLET | Freq: Every day | ORAL | Status: DC
  Administered 2020-04-07 – 2020-04-08 (×2): 20 meq via ORAL
  Filled 2020-04-07 (×3): qty 2

## 2020-04-07 MED ORDER — ALBUTEROL SULFATE (2.5 MG/3ML) 0.083% IN NEBU: 3.0000 mL | INHALATION_SOLUTION | Freq: Four times a day (QID) | RESPIRATORY_TRACT | Status: DC | PRN

## 2020-04-07 MED ORDER — LISINOPRIL 20 MG PO TABS
40.0000 mg | ORAL_TABLET | Freq: Every day | ORAL | Status: DC
  Administered 2020-04-07 – 2020-04-08 (×2): 40 mg via ORAL
  Filled 2020-04-07 (×2): qty 2

## 2020-04-07 MED ORDER — AMLODIPINE BESYLATE 5 MG PO TABS
5.0000 mg | ORAL_TABLET | Freq: Every day | ORAL | Status: DC
  Administered 2020-04-07 – 2020-04-08 (×2): 5 mg via ORAL
  Filled 2020-04-07 (×2): qty 1

## 2020-04-07 MED ORDER — HYDROCHLOROTHIAZIDE 25 MG PO TABS
25.0000 mg | ORAL_TABLET | Freq: Every day | ORAL | Status: DC
  Administered 2020-04-07 – 2020-04-08 (×2): 25 mg via ORAL
  Filled 2020-04-07 (×2): qty 1

## 2020-04-07 NOTE — TOC Initial Note (Signed)
Transition of Care [facility]) - Initial/Assessment Note   Patient Details  Name: Gerald Farrell MRN: [id number] Date of Birth: August 14, 1945  [facility] CM/SW Contact:    Sherie Don, LCSW Phone Number: 04/07/2020, 11:47 AM  Clinical Narrative: Patient is a 75 year old male who was admitted for SBO. Readmission checklist completed due to high readmission score.  CSW spoke with patient to complete assessment. Per patient, he resides at home with his son. Patient is independent with ADLs at baseline and only requires use of his cane or walker when going long distances. Patient is not active with [place] services at this time; OT and PT evaluations did not recommend any PT/OT services or additional DME. Patient's son transports patient to most medical appointments. Patient reported he was able to afford his medications through his VA benefits and was put on a payment plan until recently and is now expected to pay $1200. TOC to follow.  Expected Discharge Plan: Home/Self Care Barriers to Discharge: Continued Medical Work up  Patient Goals and CMS Choice Patient states their goals for this hospitalization and ongoing recovery are:: Discharge home with son Choice offered to / list presented to : NA  Expected Discharge Plan and Services Expected Discharge Plan: Home/Self Care In-house Referral: Clinical Social Work Post Acute Care Choice: NA Living arrangements for the past 2 months: Single Family Home              DME Arranged: N/A DME Agency: NA  Prior Living Arrangements/Services Living arrangements for the past 2 months: Single Family Home Lives with:: Adult Children Patient language and need for interpreter reviewed:: Yes Do you feel safe going back to the place where you live?: Yes      Need for Family Participation in Patient Care: No (Comment) Care giver support system in place?: Yes (comment) Current home services: DME (Cane, walker) Criminal Activity/Legal Involvement  Pertinent to Current Situation/Hospitalization: No - Comment as needed  Activities of Daily Living Home Assistive Devices/Equipment: TEFL teacher (specify quad or straight),Walker (specify type) (reading glasses, 3 wheel walker for distances) ADL Screening (condition at time of admission) Patient's cognitive ability adequate to safely complete daily activities?: Yes Is the patient deaf or have difficulty hearing?: No Does the patient have difficulty seeing, even when wearing glasses/contacts?: No Does the patient have difficulty concentrating, remembering, or making decisions?: No Patient able to express need for assistance with ADLs?: Yes Does the patient have difficulty dressing or bathing?: No Independently performs ADLs?: Yes (appropriate for developmental age) Does the patient have difficulty walking or climbing stairs?: Yes Weakness of Legs: None Weakness of Arms/Hands: None  Emotional Assessment Appearance:: Appears stated age Attitude/Demeanor/Rapport: Engaged Affect (typically observed): Accepting Orientation: : Oriented to Self,Oriented to Place,Oriented to  Time,Oriented to Situation Alcohol / Substance Use: Tobacco Use Psych Involvement: No (comment)  Admission diagnosis:  Small bowel obstruction (HCC) [K56.609] SBO (small bowel obstruction) ([place]) [K56.609] Generalized abdominal pain [R10.84] Encounter for imaging study to confirm nasogastric (NG) tube placement [Z01.89] AKI (acute kidney injury) ([place]) [N17.9] Patient Active Problem List   Diagnosis Date Noted  . ARF (acute renal failure) ([place]) 04/05/2020  . Dehydration 04/05/2020  . AKI (acute kidney injury) ([place])   . Pressure injury of skin 01/09/2020  . SBO (small bowel obstruction) ([place]) 01/06/2020  . Hypomagnesemia 01/06/2020  . HTN (hypertension) 06/08/2019  . Hypoalbuminemia 03/28/2019  . Normocytic anemia 03/28/2019  . Intervertebral disc disorder with radiculopathy of lumbar region 02/03/2018  . Lumbar  spondylolysis 02/03/2018  . COPD (chronic obstructive pulmonary disease) ([place]) 10/02/2017  . At risk for sleep apnea 10/02/2017  . Insomnia 10/02/2017  . Anxiety 10/02/2017  . Protein-calorie malnutrition, severe 07/23/2017  . Cough   . Abnormal CT of the chest   . Small bowel obstruction ([place]) 07/14/2017  . Hypernatremia 03/07/2015  . Hypophosphatemia 03/06/2015  . Acute encephalopathy 03/04/2015  . Altered mental status, unspecified 03/04/2015  . Hypokalemia 03/04/2015  . Leucocytosis 03/04/2015  . Non-traumatic rhabdomyolysis 03/04/2015  . SIRS (systemic inflammatory response syndrome) ([place]) 03/04/2015  . Substance use disorder 03/04/2015  . Personal history of other malignant neoplasm of skin 04/23/2012   PCP:  [facility]   [place], [place] [address] Phone: [phone] Fax: [place], [place] [address] Phone: [phone] Fax: [phone]  Readmission Risk Interventions Readmission Risk Prevention Plan 04/07/2020  Transportation Screening Complete  HRI or Home Care Consult Complete  Social Work Consult for [place] Planning/Counseling Complete  Palliative Care Screening Not Applicable  Medication Review Press photographer) Complete  Some recent data might be hidden

## 2020-04-07 NOTE — Progress Notes (Signed)
[facility] PROGRESS NOTE    Progress Note  Gerald Farrell  SJG:[id number] DOB: 1945-01-24 DOA: 04/05/2020 [facility]CP: [facility]     Brief Narrative:   Gerald Farrell is an 75 y.o. male past medical history significant for COPD with ongoing tobacco abuse coronary artery disease/MI status post stent placement on aspirin, depression anxiety, GERD, chronic back pain history of laparoscopic partial cholecystectomy with drain placement on 04/28/2017 for gangrenous purulent cholecystitis, also history of exploratory laparotomy with bowel resection and primary repair of small bowel serosal tear as well complete cholecystectomy on 07/18/2017 per Dr. Redmond Pulling, with a recent hospitalization in January 2022 for small bowel obstruction will resolve with conservative treatment comes into the ED with worsening periumbilical abdominal pain due to possibly small bowel obstruction.   Significant studies: 04/05/2020 CT scan of the abdomen pelvis showed markedly dilated small bowel with edema suspicious for high-grade bowel obstruction.  Patchy atelectasis with some groundglass at the right lung base. 04/05/2020 abdominal x-ray show enteric tube at the tip of the stomach multiple dilated small bowel in the midabdomen. 04/06/2020 abdominal x-ray contrast in the small bowel none in the colon consistent with high-grade bowel obstruction. Marland Kitchen Antibiotics: None  Microbiology data: Blood culture:  Procedures: None  Assessment/Plan:   High-grade SBO (small bowel obstruction) (HCC) NG tube has been clamped for the last 24 hours tolerating ice chips, surgery to address. He will start clear liquid diet. KVO IV fluids. Avoid narcotics monitor and correct electrolytes as needed.  Try to keep magnesium greater than 2 potassium greater than 4. Abdominal x-ray showed contrast in the colon. Out of bed to chair, continue to work with physical therapy.  Acute kidney injury: With a baseline creatinine of 0.7 on  admission 6.6, likely prerenal azotemia in the setting of ACE inhibitor and diuretic therapy not currently back to baseline. Blood pressure is elevated can start him back on his ACE inhibitor.  Hypovolemic hyponatremia: Improving with IV fluid hydration continue.  Essential hypertension: Blood pressure slightly elevated today we will start him back on his ACE inhibitor and his diuretic therapy continue hydralazine as needed for blood pressure greater than 180.  COPD with ongoing tobacco abuse: Continue inhalers have been counseled.  Severe protein caloric malnutrition: Clear liquid diet.  Secondary to high-grade small bowel obstruction.  Anxiety: Continue Ativan.  Chronic pain: Continue IV Dilaudid as needed.  Coronary artery disease: Lopressor scheduled continue aspirin hold ACE inhibitor.   Hypokalemia: Repleted now improved.  Sacral decubitus ulcer stage II present on admission.   RN Pressure Injury Documentation: Pressure Injury 01/07/20 Sacrum Right;Upper Stage 2 -  Partial thickness loss of dermis presenting as a shallow open injury with a red, pink wound bed without slough. (Active)  01/07/20 0145  Location: Sacrum  Location Orientation: Right;Upper  Staging: Stage 2 -  Partial thickness loss of dermis presenting as a shallow open injury with a red, pink wound bed without slough.  Wound Description (Comments):   Present on Admission: Yes   Severe protein caloric malnutrition: Estimated body mass index is 19.37 kg/m as calculated from the following:   Height as of this encounter: 5\' 10"  (1.778 m).   Weight as of this encounter: 61.2 kg.    DVT prophylaxis: lovenxo Family Communication:none Status is: Inpatient  Remains inpatient appropriate because:Hemodynamically unstable   Dispo: The patient is from: Home              Anticipated d/c is to: Home  Patient currently is not medically stable to d/c.   Difficult to place patient  No        Code Status:     Code Status Orders  (From admission, onward)         Start     Ordered   04/05/20 1833  Full code  Continuous        04/05/20 1835        Code Status History    Date Active Date Inactive Code Status Order ID Comments User Context   01/06/2020 1757 01/12/2020 1900 Full Code [id number]  Michael Boston, MD ED   07/14/2017 0935 08/02/2017 1509 Full Code [id number]  Greer Pickerel, MD ED   04/28/2017 1103 05/02/2017 1528 Full Code [id number]  Ralene Ok, MD Inpatient   04/27/2017 0538 04/28/2017 1103 Full Code [id number]  Ralene Ok, MD ED   Advance Care Planning Activity        IV Access:    Peripheral IV   Procedures and diagnostic studies:   CT ABDOMEN PELVIS WO CONTRAST  Result Date: 04/05/2020 CLINICAL DATA:  Suspected bowel obstruction in a 75 year old male. EXAM: CT ABDOMEN AND PELVIS WITHOUT CONTRAST TECHNIQUE: Multidetector CT imaging of the abdomen and pelvis was performed following the standard protocol without IV contrast. COMPARISON:  Previous CT from January of 2022 FINDINGS: Lower chest: Patchy atelectasis and some ground-glass at the RIGHT lung base and to a lesser extent on the LEFT. No effusion. No lobar consolidative changes. Hepatobiliary: No focal, suspicious hepatic lesion on noncontrast imaging. Similar contour of the liver compared to prior evaluations. Post cholecystectomy. Pancreas: Pancreas with atrophy as before. Spleen: Spleen normal size and contour. Adrenals/Urinary Tract: Adrenal glands are normal. Kidneys with smooth contours. LEFT renal cyst unchanged. No hydronephrosis. Urinary bladder with smooth contours. No nephrolithiasis. No ureteral calculi. Stomach/Bowel: Distended stomach. Duodenum crosses midline in the dilated loops of bowel which extend to the RIGHT. Small bowel anastomosis in the RIGHT hemiabdomen with bowel dilate chin reaching the anastomotic level and extending beyond the level of the anastomosis into the  bowel as it again crosses the midline markedly decompressed at the level of bowel transition. Distortion of the central abdominal mesentery. Extensive edema within the mesenteric structures of collapsed bowel loops in the LEFT hemiabdomen. Transition occurs at the root of the small bowel mesentery best seen on image 66 of series 4. There is distortion of the terminal ileum as it passes through the area of bowel transition. Extensive mesenteric edema. Moderate volume ascites in the pelvis. No pneumatosis. Small bowel distension reaches 5 cm proximal to the anastomotic site which is also proximal to the site of bowel caliber change. Vascular/Lymphatic: Calcified atheromatous plaque in the abdominal aorta. There is no gastrohepatic or hepatoduodenal ligament lymphadenopathy. No retroperitoneal or mesenteric lymphadenopathy. No pelvic sidewall lymphadenopathy. Reproductive: Prostate with some heterogeneity. Other: Moderate volume ascites in the pelvis. Musculoskeletal: Spinal degenerative changes. No acute or destructive bone finding. IMPRESSION: 1. Markedly dilated small bowel with transition in the mid abdomen at the root of the small bowel mesentery with extensive mesenteric edema and ascites suspicious for high-grade small bowel obstruction due to internal hernia and or dense adhesions at the root of the small bowel mesentery. Mesenteric edema and ascites is concerning and marked and involves decompressed loops in the LEFT hemiabdomen. Correlation with lactate to assess for any signs of ischemia is suggested. No current pneumatosis. 2. Patchy atelectasis and some ground-glass at the RIGHT lung base and to a lesser extent  on the LEFT. Findings may be related atelectasis. Correlate with any risk factors for aspiration. 3. Aortic atherosclerosis. These results were called by telephone at the time of interpretation on 04/05/2020 at 2:41 pm to provider Carmin Muskrat , who verbally acknowledged these results. Aortic  Atherosclerosis (ICD10-I70.0). Electronically Signed   By: Zetta Bills M.D.   On: 04/05/2020 14:41   DG Abd Portable 1V  Result Date: 04/07/2020 CLINICAL DATA:  Small-bowel obstruction. EXAM: PORTABLE ABDOMEN - 1 VIEW COMPARISON:  04/06/2020.  04/05/2020.  CT 04/05/2020. FINDINGS: NG tube noted with tip in the upper most portion stomach. Side hole at the gastroesophageal junction. Advancement of approximately 10 cm suggested. Persistent small-bowel distention. Oral contrast now noted in the colon. Interposition of the colon under the hemidiaphragms again noted. Upright and decubitus views of the abdomen suggested to completely exclude free air. Degenerative changes scoliosis lumbar spine. Elevation right hemidiaphragm again noted. IMPRESSION: 1. NG tube noted with tip in the upper most portion stomach. Side hole at the gastroesophageal junction. Advancement of the NG tube approximately 10 cm suggested. 2. Persistent small-bowel distention consistent with persistent small-bowel obstruction. Oral contrast now noted in the colon. Interposition of the colon under the hemidiaphragms again noted. Upright abdomen and decubitus views of the abdomen suggested to completely exclude free air. These results will be called to the ordering clinician or representative by the Radiologist Assistant, and communication documented in the PACS or [employer]. Electronically Signed   By: Marcello Moores  Register   On: 04/07/2020 05:54   DG Abd Portable 1V-Small Bowel Obstruction Protocol-initial, 8 hr delay  Result Date: 04/06/2020 CLINICAL DATA:  Small-bowel protocol.  8 hours post contrast film. EXAM: PORTABLE ABDOMEN - 1 VIEW COMPARISON:  04/05/2020 FINDINGS: Dilated gas-filled small bowel. Small amount of gas in the colon. Dilute contrast is suggested in the small bowel. No definite contrast material in the colon. Changes consistent with high-grade small bowel obstruction. Enteric tube with tip in the left upper quadrant  consistent with location in the upper stomach. Degenerative changes in the lumbar spine. Lumbar scoliosis convex towards the right. IMPRESSION: Dilute contrast suggested in the small bowel. No contrast material identified in the colon. Changes consistent with high-grade small bowel obstruction. Electronically Signed   By: Lucienne Capers M.D.   On: 04/06/2020 03:01   DG Abd Portable 1V-Small Bowel Protocol-Position Verification  Result Date: 04/05/2020 CLINICAL DATA:  75 year old male status post NG placement. EXAM: PORTABLE ABDOMEN - 1 VIEW COMPARISON:  CT abdomen pelvis dated 04/05/2020. FINDINGS: Enteric tube with side-port in the proximal stomach and tip in the body of the stomach. Dilated small bowel loop in the mid abdomen measures up to 6.1 cm in caliber. IMPRESSION: 1. Enteric tube with tip in the body of the stomach. 2. Dilated small bowel loop in the mid abdomen. Electronically Signed   By: Anner Crete M.D.   On: 04/05/2020 17:12     Medical Consultants:    None.   Subjective:    Gerald Farrell feels better wants to go home today.  Objective:    Vitals:   04/06/20 1351 04/06/20 2029 04/07/20 0548 04/07/20 0828  BP: 136/85 (!) 149/92 (!) 195/99   Pulse: (!) 57 63 76   Resp:  16 16   Temp: (!) 97.5 F (36.4 C) 97.7 F (36.5 C) 97.9 F (36.6 C)   TempSrc: Oral Oral Oral   SpO2: 99% 100% 96% 96%  Weight:      Height:  SpO2: 96 %   Intake/Output Summary (Last 24 hours) at 04/07/2020 0932 Last data filed at 04/07/2020 0551 Gross per 24 hour  Intake 210 ml  Output 3950 ml  Net -3740 ml   Filed Weights   04/05/20 0926 04/05/20 2256  Weight: 62 kg 61.2 kg    Exam: General exam: In no acute distress. Respiratory system: Good air movement and clear to auscultation. Cardiovascular system: S1 & S2 heard, RRR. No JVD. Gastrointestinal system: Abdomen is nondistended, soft and nontender.  Extremities: No pedal edema. Skin: No rashes, lesions or  ulcers Psychiatry: Judgement and insight appear normal. Mood & affect appropriate.   Data Reviewed:    Labs: Basic Metabolic Panel: Recent Labs  Lab 04/05/20 0932 04/05/20 2054 04/06/20 0421  NA 132* 134* 134*  K 3.3* 3.2* 4.3  CL 90* 104 106  CO2 30 24 21*  GLUCOSE 281* 81 73  BUN 10 12 10   CREATININE 6.66* 0.61 0.64  CALCIUM 8.6* 8.1* 7.8*  MG  --  1.5* 1.9  PHOS  --  3.4  --    GFR Estimated Creatinine Clearance: 72.3 mL/min (by C-G formula based on SCr of 0.64 mg/dL). Liver Function Tests: Recent Labs  Lab 04/05/20 0932 04/05/20 2054 04/06/20 0421  AST 28  --  23  ALT 14  --  11  ALKPHOS 114  --  44  BILITOT 1.0  --  1.0  PROT 7.8  --  4.6*  ALBUMIN 3.8 2.8* 2.5*   Recent Labs  Lab 04/05/20 0932  LIPASE 26   No results for input(s): AMMONIA in the last 168 hours. Coagulation profile No results for input(s): INR, PROTIME in the last 168 hours. COVID-19 Labs  No results for input(s): DDIMER, FERRITIN, LDH, CRP in the last 72 hours.  Lab Results  Component Value Date   SARSCOV2NAA NEGATIVE 04/05/2020   [place] NEGATIVE 01/06/2020    CBC: Recent Labs  Lab 04/05/20 0932 04/06/20 0421  WBC 6.3 8.8  NEUTROABS 4.7  --   HGB 12.4* 13.5  HCT 38.4* 40.3  MCV 84.8 96.4  PLT 189 157   Cardiac Enzymes: No results for input(s): CKTOTAL, CKMB, CKMBINDEX, TROPONINI in the last 168 hours. BNP (last 3 results) No results for input(s): PROBNP in the last 8760 hours. CBG: Recent Labs  Lab 04/06/20 0738 04/06/20 0825 04/07/20 0716  GLUCAP 58* 118* 108*   D-Dimer: No results for input(s): DDIMER in the last 72 hours. Hgb A1c: No results for input(s): HGBA1C in the last 72 hours. Lipid Profile: No results for input(s): CHOL, HDL, LDLCALC, TRIG, CHOLHDL, LDLDIRECT in the last 72 hours. Thyroid function studies: No results for input(s): TSH, T4TOTAL, T3FREE, THYROIDAB in the last 72 hours.  Invalid input(s): FREET3 Anemia work up: No results  for input(s): VITAMINB12, FOLATE, FERRITIN, TIBC, IRON, RETICCTPCT in the last 72 hours. Sepsis Labs: Recent Labs  Lab 04/05/20 0932 04/05/20 1429 04/06/20 0421  WBC 6.3  --  8.8  LATICACIDVEN  --  0.7  --    Microbiology Recent Results (from the past 240 hour(s))  Resp Panel by RT-PCR (Flu A&B, Covid) Nasopharyngeal Swab     Status: None   Collection Time: 04/05/20  3:43 PM   Specimen: Nasopharyngeal Swab; Nasopharyngeal(NP) swabs in vial transport medium  Result Value Ref Range Status   SARS Coronavirus 2 by RT PCR NEGATIVE NEGATIVE Final    Comment: (NOTE) SARS-CoV-2 target nucleic acids are NOT DETECTED.  The SARS-CoV-2 RNA is generally detectable  in upper respiratory specimens during the acute phase of infection. The lowest concentration of SARS-CoV-2 viral copies this assay can detect is 138 copies/mL. A negative result does not preclude SARS-Cov-2 infection and should not be used as the sole basis for treatment or other patient management decisions. A negative result may occur with  improper specimen collection/handling, submission of specimen other than nasopharyngeal swab, presence of viral mutation(s) within the areas targeted by this assay, and inadequate number of viral copies(<138 copies/mL). A negative result must be combined with clinical observations, patient history, and epidemiological information. The expected result is Negative.  Fact Sheet for Patients:  EntrepreneurPulse.com.au  Fact Sheet for Healthcare Providers:  IncredibleEmployment.be  This test is no t yet approved or cleared by the [place] FDA and  has been authorized for detection and/or diagnosis of SARS-CoV-2 by FDA under an Emergency Use Authorization (EUA). This EUA will remain  in effect (meaning this test can be used) for the duration of the COVID-19 declaration under Section 564(b)(1) of the Act, 21 U.S.C.section 360bbb-3(b)(1), unless the  authorization is terminated  or revoked sooner.       Influenza A by PCR NEGATIVE NEGATIVE Final   Influenza B by PCR NEGATIVE NEGATIVE Final    Comment: (NOTE) The Xpert Xpress SARS-CoV-2/FLU/RSV plus assay is intended as an aid in the diagnosis of influenza from Nasopharyngeal swab specimens and should not be used as a sole basis for treatment. Nasal washings and aspirates are unacceptable for Xpert Xpress SARS-CoV-2/FLU/RSV testing.  Fact Sheet for Patients: EntrepreneurPulse.com.au  Fact Sheet for Healthcare Providers: IncredibleEmployment.be  This test is not yet approved or cleared by the [place] FDA and has been authorized for detection and/or diagnosis of SARS-CoV-2 by FDA under an Emergency Use Authorization (EUA). This EUA will remain in effect (meaning this test can be used) for the duration of the COVID-19 declaration under Section 564(b)(1) of the Act, 21 U.S.C. section 360bbb-3(b)(1), unless the authorization is terminated or revoked.  Performed at [facility], [place] [address] [place], [place] [address]      Medications:   . aspirin  81 mg Oral Daily  . carvedilol  25 mg Oral BID WC  . gabapentin  800 mg Oral TID  . mometasone-formoterol  2 puff Inhalation BID  . nicotine  14 mg Transdermal Daily  . pantoprazole  40 mg Oral Daily  . rosuvastatin  10 mg Oral QHS  . sodium chloride flush  3 mL Intravenous Q12H  . umeclidinium bromide  1 puff Inhalation Daily  . zolpidem  5 mg Oral QHS   Continuous Infusions: . dextrose 75 mL/hr at 04/06/20 1449  . sodium chloride        LOS: 2 days   Charlynne Cousins  [facility]  04/07/2020, 9:32 AM

## 2020-04-07 NOTE — Progress Notes (Signed)
[facility] Progress Note     Subjective: CC-  Feeling better each day. Continues to have some intermittent abdominal pain, but it is less frequent and less severe since admission. Denies n/v. Passing flatus today and he had multiple BMs yesterday. NG tube with no output last 24 hours, but looks like it is no longer in good position. Xray today shows persistent small-bowel distention, oral contrast now noted in the colon.  Objective: Vital signs in last 24 hours: Temp:  [97.5 F (36.4 C)-97.9 F (36.6 C)] 97.9 F (36.6 C) (04/07 0548) Pulse Rate:  [57-76] 76 (04/07 0548) Resp:  [16] 16 (04/07 0548) BP: (136-195)/(85-99) 195/99 (04/07 0548) SpO2:  [96 %-100 %] 96 % (04/07 0828) Last BM Date: 04/05/20  Intake/Output from previous day: 04/06 0701 - 04/07 0700 In: 210 [I.V.:210] Out: 3950 [Urine:3950] Intake/Output this shift: No intake/output data recorded.  PE: Gen:  Alert, NAD, pleasant Pulm:  rate and effort normal Abd: well healed midline and laparoscopicincisions, soft, minimal distension, nontender.+BS, no masses, hernias, or organomegaly  Skin: no rashes noted, warm and dry   Lab Results:  Recent Labs    04/05/20 0932 04/06/20 0421  WBC 6.3 8.8  HGB 12.4* 13.5  HCT 38.4* 40.3  PLT 189 157   BMET Recent Labs    04/05/20 2054 04/06/20 0421  NA 134* 134*  K 3.2* 4.3  CL 104 106  CO2 24 21*  GLUCOSE 81 73  BUN 12 10  CREATININE 0.61 0.64  CALCIUM 8.1* 7.8*   PT/INR No results for input(s): LABPROT, INR in the last 72 hours. CMP     Component Value Date/Time   NA 134 (L) 04/06/2020 0421   K 4.3 04/06/2020 0421   CL 106 04/06/2020 0421   CO2 21 (L) 04/06/2020 0421   GLUCOSE 73 04/06/2020 0421   BUN 10 04/06/2020 0421   CREATININE 0.64 04/06/2020 0421   CALCIUM 7.8 (L) 04/06/2020 0421   PROT 4.6 (L) 04/06/2020 0421   ALBUMIN 2.5 (L) 04/06/2020 0421   AST 23 04/06/2020 0421   ALT 11 04/06/2020 0421   ALKPHOS 44 04/06/2020 0421    BILITOT 1.0 04/06/2020 0421   GFRNONAA >60 04/06/2020 0421   GFRAA >60 10/18/2017 1937   Lipase     Component Value Date/Time   LIPASE 26 04/05/2020 0932       Studies/Results: CT ABDOMEN PELVIS WO CONTRAST  Result Date: 04/05/2020 CLINICAL DATA:  Suspected bowel obstruction in a 75 year old male. EXAM: CT ABDOMEN AND PELVIS WITHOUT CONTRAST TECHNIQUE: Multidetector CT imaging of the abdomen and pelvis was performed following the standard protocol without IV contrast. COMPARISON:  Previous CT from January of 2022 FINDINGS: Lower chest: Patchy atelectasis and some ground-glass at the RIGHT lung base and to a lesser extent on the LEFT. No effusion. No lobar consolidative changes. Hepatobiliary: No focal, suspicious hepatic lesion on noncontrast imaging. Similar contour of the liver compared to prior evaluations. Post cholecystectomy. Pancreas: Pancreas with atrophy as before. Spleen: Spleen normal size and contour. Adrenals/Urinary Tract: Adrenal glands are normal. Kidneys with smooth contours. LEFT renal cyst unchanged. No hydronephrosis. Urinary bladder with smooth contours. No nephrolithiasis. No ureteral calculi. Stomach/Bowel: Distended stomach. Duodenum crosses midline in the dilated loops of bowel which extend to the RIGHT. Small bowel anastomosis in the RIGHT hemiabdomen with bowel dilate chin reaching the anastomotic level and extending beyond the level of the anastomosis into the bowel as it again crosses the midline markedly decompressed at the level  of bowel transition. Distortion of the central abdominal mesentery. Extensive edema within the mesenteric structures of collapsed bowel loops in the LEFT hemiabdomen. Transition occurs at the root of the small bowel mesentery best seen on image 66 of series 4. There is distortion of the terminal ileum as it passes through the area of bowel transition. Extensive mesenteric edema. Moderate volume ascites in the pelvis. No pneumatosis. Small bowel  distension reaches 5 cm proximal to the anastomotic site which is also proximal to the site of bowel caliber change. Vascular/Lymphatic: Calcified atheromatous plaque in the abdominal aorta. There is no gastrohepatic or hepatoduodenal ligament lymphadenopathy. No retroperitoneal or mesenteric lymphadenopathy. No pelvic sidewall lymphadenopathy. Reproductive: Prostate with some heterogeneity. Other: Moderate volume ascites in the pelvis. Musculoskeletal: Spinal degenerative changes. No acute or destructive bone finding. IMPRESSION: 1. Markedly dilated small bowel with transition in the mid abdomen at the root of the small bowel mesentery with extensive mesenteric edema and ascites suspicious for high-grade small bowel obstruction due to internal hernia and or dense adhesions at the root of the small bowel mesentery. Mesenteric edema and ascites is concerning and marked and involves decompressed loops in the LEFT hemiabdomen. Correlation with lactate to assess for any signs of ischemia is suggested. No current pneumatosis. 2. Patchy atelectasis and some ground-glass at the RIGHT lung base and to a lesser extent on the LEFT. Findings may be related atelectasis. Correlate with any risk factors for aspiration. 3. Aortic atherosclerosis. These results were called by telephone at the time of interpretation on 04/05/2020 at 2:41 pm to provider Carmin Muskrat , who verbally acknowledged these results. Aortic Atherosclerosis (ICD10-I70.0). Electronically Signed   By: Zetta Bills M.D.   On: 04/05/2020 14:41   DG Abd Portable 1V  Result Date: 04/07/2020 CLINICAL DATA:  Small-bowel obstruction. EXAM: PORTABLE ABDOMEN - 1 VIEW COMPARISON:  04/06/2020.  04/05/2020.  CT 04/05/2020. FINDINGS: NG tube noted with tip in the upper most portion stomach. Side hole at the gastroesophageal junction. Advancement of approximately 10 cm suggested. Persistent small-bowel distention. Oral contrast now noted in the colon. Interposition of  the colon under the hemidiaphragms again noted. Upright and decubitus views of the abdomen suggested to completely exclude free air. Degenerative changes scoliosis lumbar spine. Elevation right hemidiaphragm again noted. IMPRESSION: 1. NG tube noted with tip in the upper most portion stomach. Side hole at the gastroesophageal junction. Advancement of the NG tube approximately 10 cm suggested. 2. Persistent small-bowel distention consistent with persistent small-bowel obstruction. Oral contrast now noted in the colon. Interposition of the colon under the hemidiaphragms again noted. Upright abdomen and decubitus views of the abdomen suggested to completely exclude free air. These results will be called to the ordering clinician or representative by the Radiologist Assistant, and communication documented in the PACS or [employer]. Electronically Signed   By: Marcello Moores  Register   On: 04/07/2020 05:54   DG Abd Portable 1V-Small Bowel Obstruction Protocol-initial, 8 hr delay  Result Date: 04/06/2020 CLINICAL DATA:  Small-bowel protocol.  8 hours post contrast film. EXAM: PORTABLE ABDOMEN - 1 VIEW COMPARISON:  04/05/2020 FINDINGS: Dilated gas-filled small bowel. Small amount of gas in the colon. Dilute contrast is suggested in the small bowel. No definite contrast material in the colon. Changes consistent with high-grade small bowel obstruction. Enteric tube with tip in the left upper quadrant consistent with location in the upper stomach. Degenerative changes in the lumbar spine. Lumbar scoliosis convex towards the right. IMPRESSION: Dilute contrast suggested in the small bowel.  No contrast material identified in the colon. Changes consistent with high-grade small bowel obstruction. Electronically Signed   By: Lucienne Capers M.D.   On: 04/06/2020 03:01   DG Abd Portable 1V-Small Bowel Protocol-Position Verification  Result Date: 04/05/2020 CLINICAL DATA:  75 year old male status post NG placement. EXAM:  PORTABLE ABDOMEN - 1 VIEW COMPARISON:  CT abdomen pelvis dated 04/05/2020. FINDINGS: Enteric tube with side-port in the proximal stomach and tip in the body of the stomach. Dilated small bowel loop in the mid abdomen measures up to 6.1 cm in caliber. IMPRESSION: 1. Enteric tube with tip in the body of the stomach. 2. Dilated small bowel loop in the mid abdomen. Electronically Signed   By: Anner Crete M.D.   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 Rosendo Gros for gangrenous/ purulent cholecystitis; Exploratory laparotomy with small bowel resection and primary repair of small bowel serosal tearas well as completion cholecystectomy 07/18/2017 by Dr. Redmond Pulling - recent admission for SBO 01/06/20 through 01/12/20 for SBO that resolved with conservative management - CT scan 4/5 showed markedlydilated small bowel with transition in the mid abdomen at the root of the small bowel mesentery with extensive mesenteric edema and ascites suspicious for high-grade small bowel obstruction due to internal hernia and or dense adhesions at the root of the small bowel mesentery; no current pneumatosis - WBC and lactic acid WNL on admission, VSS  ID -none VTE -ok for chemical DVT prophylaxis from surgical standpoint FEN -IVF, clamp NG, CLD Foley -none Follow up -TBD  Plan: Still has some dilated loops of small bowel on xray, but contrast made it to his  colon and he is having bowel function. Abdominal exam improved/benign. Clamp NG tube and allow clear liquids. If patient becomes nauseated or distended then return NG to LIWS.   LOS: 2 days    [place]ellington [place], [facility] 04/07/2020, 9:49 AM Please see Amion for pager number during day hours 7:00am-4:30pm

## 2020-04-07 NOTE — Progress Notes (Signed)
PT Cancellation Note  Patient Details Name: Gerald Farrell MRN: [id number] DOB: 1945-01-18   Cancelled Treatment:    Reason Eval/Treat Not Completed: Other (comment) Pt declined PT this morning due to awaiting food tray.  Currently with elevated BP and RN in room.  Will check back as schedule permits.   Samar Dass,KATHrine E 04/07/2020, 1:52 PM Kati PT, DPT Acute Rehabilitation Services Pager: [phone] Office: [phone]

## 2020-04-07 NOTE — Progress Notes (Signed)
Increased blood pressure, MD made aware, new orders made and carried out.  Noticed what appears to be tobacco remnants from cigarettes in patients bed, patient denies smoking or having tobacco.  MD made aware

## 2020-04-08 DIAGNOSIS — J449 Chronic obstructive pulmonary disease, unspecified: Secondary | ICD-10-CM | POA: Diagnosis not present

## 2020-04-08 DIAGNOSIS — I10 Essential (primary) hypertension: Secondary | ICD-10-CM | POA: Diagnosis not present

## 2020-04-08 DIAGNOSIS — K56609 Unspecified intestinal obstruction, unspecified as to partial versus complete obstruction: Secondary | ICD-10-CM | POA: Diagnosis not present

## 2020-04-08 DIAGNOSIS — N179 Acute kidney failure, unspecified: Secondary | ICD-10-CM | POA: Diagnosis not present

## 2020-04-08 LAB — GLUCOSE, CAPILLARY: Glucose-Capillary: 105 mg/dL — ABNORMAL HIGH (ref 70–99)

## 2020-04-08 MED ORDER — ADULT MULTIVITAMIN W/MINERALS CH
1.0000 | ORAL_TABLET | Freq: Every day | ORAL | Status: DC
  Administered 2020-04-08: 1 via ORAL
  Filled 2020-04-08: qty 1

## 2020-04-08 MED ORDER — ENSURE ENLIVE PO LIQD: 237.0000 mL | Freq: Three times a day (TID) | ORAL | Status: DC

## 2020-04-08 NOTE — Progress Notes (Signed)
[facility] Progress Note     Subjective: CC-  Tired this morning. Abdomen feels the same as yesterday. He reports some persistent intermittent abdominal pain, but it is less severe. Denies n/v. He has taken in several clear liquids. He removed his NG tube this morning. No more BMs yesterday but he did continue to pass flatus and had no abdominal bloating.  Objective: Vital signs in last 24 hours: Temp:  [97.8 F (36.6 C)] 97.8 F (36.6 C) (04/07 1954) Pulse Rate:  [86-103] 86 (04/08 0526) Resp:  [16-20] 16 (04/08 0526) BP: (101-197)/(64-140) 101/64 (04/08 0526) SpO2:  [96 %-100 %] 97 % (04/08 0721) Last BM Date: 04/06/20  Intake/Output from previous day: 04/07 0701 - 04/08 0700 In: 2304.5 [P.O.:1440; I.V.:864.5] Out: 4475 [Urine:4475] Intake/Output this shift: No intake/output data recorded.  PE: Gen: Alert, NAD, pleasant Pulm: rate and effort normal TKW:IOXB healed midline and laparoscopicincisions, soft, nondistended, nontender.+BS, no masses, hernias, or organomegaly Skin: no rashes noted, warm and dry   Lab Results:  Recent Labs    04/05/20 0932 04/06/20 0421  WBC 6.3 8.8  HGB 12.4* 13.5  HCT 38.4* 40.3  PLT 189 157   BMET Recent Labs    04/05/20 2054 04/06/20 0421  NA 134* 134*  K 3.2* 4.3  CL 104 106  CO2 24 21*  GLUCOSE 81 73  BUN 12 10  CREATININE 0.61 0.64  CALCIUM 8.1* 7.8*   PT/INR No results for input(s): LABPROT, INR in the last 72 hours. CMP     Component Value Date/Time   NA 134 (L) 04/06/2020 0421   K 4.3 04/06/2020 0421   CL 106 04/06/2020 0421   CO2 21 (L) 04/06/2020 0421   GLUCOSE 73 04/06/2020 0421   BUN 10 04/06/2020 0421   CREATININE 0.64 04/06/2020 0421   CALCIUM 7.8 (L) 04/06/2020 0421   PROT 4.6 (L) 04/06/2020 0421   ALBUMIN 2.5 (L) 04/06/2020 0421   AST 23 04/06/2020 0421   ALT 11 04/06/2020 0421   ALKPHOS 44 04/06/2020 0421   BILITOT 1.0 04/06/2020 0421   GFRNONAA >60 04/06/2020 0421   GFRAA >60  10/18/2017 1937   Lipase     Component Value Date/Time   LIPASE 26 04/05/2020 0932       Studies/Results: DG Abd Portable 1V  Result Date: 04/07/2020 CLINICAL DATA:  Small-bowel obstruction. EXAM: PORTABLE ABDOMEN - 1 VIEW COMPARISON:  04/06/2020.  04/05/2020.  CT 04/05/2020. FINDINGS: NG tube noted with tip in the upper most portion stomach. Side hole at the gastroesophageal junction. Advancement of approximately 10 cm suggested. Persistent small-bowel distention. Oral contrast now noted in the colon. Interposition of the colon under the hemidiaphragms again noted. Upright and decubitus views of the abdomen suggested to completely exclude free air. Degenerative changes scoliosis lumbar spine. Elevation right hemidiaphragm again noted. IMPRESSION: 1. NG tube noted with tip in the upper most portion stomach. Side hole at the gastroesophageal junction. Advancement of the NG tube approximately 10 cm suggested. 2. Persistent small-bowel distention consistent with persistent small-bowel obstruction. Oral contrast now noted in the colon. Interposition of the colon under the hemidiaphragms again noted. Upright abdomen and decubitus views of the abdomen suggested to completely exclude free air. These results will be called to the ordering clinician or representative by the Radiologist Assistant, and communication documented in the PACS or [employer]. 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 Rosendo Gros for gangrenous/ purulent cholecystitis; Exploratory laparotomy with small bowel resection and primary repair of small bowel serosal tearas well as completion cholecystectomy 07/18/2017 by Dr. Redmond Pulling - recent admission for SBO 01/06/20 through 01/12/20 for SBO that resolved with conservative management - CT scan4/5showedmarkedlydilated small bowel with transition in the mid abdomen at the root of the small bowel mesentery with extensive mesenteric edema and ascites suspicious for high-grade small bowel obstruction due to internal hernia and or dense adhesions at the root of the small bowel mesentery; no current pneumatosis - WBC and lactic acid WNL on admission, VSS  ID -none VTE -ok for chemical DVT prophylaxis from surgical standpoint FEN -FLD Foley -none Follow up -TBD  Plan: Advance to full liquids, and advance diet as tolerated. Continue mobilizing. [place] for discharge home if he tolerates this and has no recurrence of abdominal pain, nausea, or vomiting.    LOS: 3 days    [facility] 04/08/2020, 8:24 AM Please see Amion for pager number during day hours 7:00am-4:30pm

## 2020-04-08 NOTE — Discharge Summary (Addendum)
Physician Discharge Summary  Gerald Farrell:[id number] DOB: Dec 22, 1945 DOA: 04/05/2020  [facility]  Admit date: 04/05/2020 Discharge date: 04/08/2020  Admitted From: Home Disposition:  Home  Recommendations for Outpatient Follow-up:  1. Follow up with Surgery in 1-2 weeks 2. Please obtain BMP/CBC in one week Home Health:no Equipment/Devices:None  Discharge Condition:Stable CODE STATUS:Full Diet recommendation: Heart Healthy   Brief/Interim Summary: 75 y.o. male past medical history significant for COPD with ongoing tobacco abuse coronary artery disease/MI status post stent placement on aspirin, depression anxiety, GERD, chronic back pain history of laparoscopic partial cholecystectomy with drain placement on 04/28/2017 for gangrenous purulent cholecystitis, also history of exploratory laparotomy with bowel resection and primary repair of small bowel serosal tear as well complete cholecystectomy on 07/18/2017 per Dr. Redmond Pulling, with a recent hospitalization in January 2022 for small bowel obstruction will resolve with conservative treatment comes into the ED with worsening periumbilical abdominal pain due to possibly small bowel obstruction.   Significant studies: 04/05/2020 CT scan of the abdomen pelvis showed markedly dilated small bowel with edema suspicious for high-grade bowel obstruction.  Patchy atelectasis with some groundglass at the right lung base. 04/05/2020 abdominal x-ray show enteric tube at the tip of the stomach multiple dilated small bowel in the midabdomen. 04/06/2020 abdominal x-ray contrast in the small bowel none in the colon consistent with high-grade bowel obstruction. Marland Kitchen Antibiotics: None  Microbiology data: Blood culture:  Procedures: None  Discharge Diagnoses:  Principal Problem:   SBO (small bowel obstruction) (HCC) Active Problems:   Protein-calorie malnutrition, severe   COPD (chronic obstructive pulmonary disease) (HCC)   Anxiety    HTN (hypertension)   Hypokalemia   ARF (acute renal failure) (HCC)   Dehydration  High-grade small bowel obstruction: NG tube was placed he was hydrated. Surgery was consulted and recommended conservative management for now His potassium 4 magnesium greater than 2. Started passing gas NG tube was discontinued diet was advanced which she tolerated well.  Acute kidney injury: With a baseline creatinine 0.7 on admission 6.6 likelyin the setting of ACE inhibitor and diuretic therapy. This resolved with IV fluid hydration and holding diuretic and ACE inhibitor which will be resumed as an outpatient.  Hypovolemic hyponatremia: Resolved with IV fluid hydration.  Essential hypertension: Blood pressure was slightly elevated he was started on hydralazine IV as needed, once he was able to tolerate orals his antihypertensive medications were held and his blood pressure remained controlled no changes were made.  COPD Noted no change made to his medication.  Severe protein caloric malnutrition Ensure 3 times daily.  Anxiety: Continue Ativan.  Chronic pain: No changes made to his medication.  Coronary artery disease: Continues lisinopril 20 mg, metoprolol and ACE inhibitor.  Hypokalemia: Likely due to gastrointestinal losses repleted now resolved.  Discharge Instructions  Discharge Instructions    Diet - low sodium heart healthy   Complete by: As directed    Increase activity slowly   Complete by: As directed      Allergies as of 04/08/2020      Reactions   Meloxicam Other (See Comments)   unknown   Mirtazapine Other (See Comments)   unknown      Medication List    TAKE these medications   albuterol 108 (90 Base) MCG/ACT inhaler Commonly known as: VENTOLIN HFA Inhale 2 puffs into the lungs every 6 (six) hours as needed for wheezing or shortness of breath.   aspirin 81 MG chewable tablet Chew 81 mg by mouth daily.  carvedilol 25 MG tablet Commonly known as: COREG Do  not restart this medicine until you have talked with your primary care and they have recommended you resume it.  Your blood pressure has been too low while you were here to be on this. What changed:   how much to take  how to take this  when to take this  additional instructions   Cholecalciferol 25 MCG (1000 UT) capsule Take 1,000 Units by mouth daily.   cloNIDine 0.2 MG tablet Commonly known as: CATAPRES Take 0.2 mg by mouth 3 (three) times daily.   gabapentin 400 MG capsule Commonly known as: NEURONTIN Take 800 mg by mouth 3 (three) times daily.   hydrochlorothiazide 25 MG tablet Commonly known as: HYDRODIURIL Take 25 mg by mouth daily.   lisinopril 40 MG tablet Commonly known as: ZESTRIL You are currently taking this as 1 tablet daily;   You were listed as taking this twice a day before admission. What changed:   how much to take  how to take this  when to take this  additional instructions   mometasone 220 MCG/INH inhaler Commonly known as: ASMANEX Inhale 2 puffs into the lungs 2 (two) times daily.   nicotine 14 mg/24hr patch Commonly known as: NICODERM CQ - dosed in mg/24 hours If you need this to stay off your cigarettes you can buy this over-the-counter at any drugstore and follow the package directions. What changed:   how much to take  how to take this  when to take this  additional instructions   nitroGLYCERIN 0.4 MG SL tablet Commonly known as: NITROSTAT Place 0.4 mg under the tongue every 5 (five) minutes as needed for chest pain.   omeprazole 20 MG capsule Commonly known as: PRILOSEC Take 40 mg by mouth daily.   oxyCODONE 5 MG immediate release tablet Commonly known as: Oxy IR/ROXICODONE Take 5 mg by mouth every 4 (four) hours as needed for severe pain (prn).   polyethylene glycol 17 g packet Commonly known as: MIRALAX / GLYCOLAX If you have issues with constipation you can use this as the package directions instructed.  You can buy  this over-the-counter at any drugstore. What changed:   how much to take  how to take this  when to take this  additional instructions   Potassium Chloride ER 20 MEQ Tbcr Take 20 mEq by mouth daily.   rosuvastatin 20 MG tablet Commonly known as: CRESTOR Take 10 mg by mouth at bedtime.   Stiolto Respimat 2.5-2.5 MCG/ACT Aers Generic drug: Tiotropium Bromide-Olodaterol Inhale 2 puffs into the lungs 2 (two) times daily.   zolpidem 5 MG tablet Commonly known as: AMBIEN Take 5 mg by mouth at bedtime.       Allergies  Allergen Reactions  . Meloxicam Other (See Comments)    unknown  . Mirtazapine Other (See Comments)    unknown    Consultations:  General surgery   Procedures/Studies: CT ABDOMEN PELVIS WO CONTRAST  Result Date: 04/05/2020 CLINICAL DATA:  Suspected bowel obstruction in a 75 year old male. EXAM: CT ABDOMEN AND PELVIS WITHOUT CONTRAST TECHNIQUE: Multidetector CT imaging of the abdomen and pelvis was performed following the standard protocol without IV contrast. COMPARISON:  Previous CT from January of 2022 FINDINGS: Lower chest: Patchy atelectasis and some ground-glass at the RIGHT lung base and to a lesser extent on the LEFT. No effusion. No lobar consolidative changes. Hepatobiliary: No focal, suspicious hepatic lesion on noncontrast imaging. Similar contour of the liver compared to prior  evaluations. Post cholecystectomy. Pancreas: Pancreas with atrophy as before. Spleen: Spleen normal size and contour. Adrenals/Urinary Tract: Adrenal glands are normal. Kidneys with smooth contours. LEFT renal cyst unchanged. No hydronephrosis. Urinary bladder with smooth contours. No nephrolithiasis. No ureteral calculi. Stomach/Bowel: Distended stomach. Duodenum crosses midline in the dilated loops of bowel which extend to the RIGHT. Small bowel anastomosis in the RIGHT hemiabdomen with bowel dilate chin reaching the anastomotic level and extending beyond the level of the  anastomosis into the bowel as it again crosses the midline markedly decompressed at the level of bowel transition. Distortion of the central abdominal mesentery. Extensive edema within the mesenteric structures of collapsed bowel loops in the LEFT hemiabdomen. Transition occurs at the root of the small bowel mesentery best seen on image 66 of series 4. There is distortion of the terminal ileum as it passes through the area of bowel transition. Extensive mesenteric edema. Moderate volume ascites in the pelvis. No pneumatosis. Small bowel distension reaches 5 cm proximal to the anastomotic site which is also proximal to the site of bowel caliber change. Vascular/Lymphatic: Calcified atheromatous plaque in the abdominal aorta. There is no gastrohepatic or hepatoduodenal ligament lymphadenopathy. No retroperitoneal or mesenteric lymphadenopathy. No pelvic sidewall lymphadenopathy. Reproductive: Prostate with some heterogeneity. Other: Moderate volume ascites in the pelvis. Musculoskeletal: Spinal degenerative changes. No acute or destructive bone finding. IMPRESSION: 1. Markedly dilated small bowel with transition in the mid abdomen at the root of the small bowel mesentery with extensive mesenteric edema and ascites suspicious for high-grade small bowel obstruction due to internal hernia and or dense adhesions at the root of the small bowel mesentery. Mesenteric edema and ascites is concerning and marked and involves decompressed loops in the LEFT hemiabdomen. Correlation with lactate to assess for any signs of ischemia is suggested. No current pneumatosis. 2. Patchy atelectasis and some ground-glass at the RIGHT lung base and to a lesser extent on the LEFT. Findings may be related atelectasis. Correlate with any risk factors for aspiration. 3. Aortic atherosclerosis. These results were called by telephone at the time of interpretation on 04/05/2020 at 2:41 pm to provider Carmin Muskrat , who verbally acknowledged these  results. Aortic Atherosclerosis (ICD10-I70.0). Electronically Signed   By: Zetta Bills M.D.   On: 04/05/2020 14:41   DG Abd Portable 1V  Result Date: 04/07/2020 CLINICAL DATA:  Small-bowel obstruction. EXAM: PORTABLE ABDOMEN - 1 VIEW COMPARISON:  04/06/2020.  04/05/2020.  CT 04/05/2020. FINDINGS: NG tube noted with tip in the upper most portion stomach. Side hole at the gastroesophageal junction. Advancement of approximately 10 cm suggested. Persistent small-bowel distention. Oral contrast now noted in the colon. Interposition of the colon under the hemidiaphragms again noted. Upright and decubitus views of the abdomen suggested to completely exclude free air. Degenerative changes scoliosis lumbar spine. Elevation right hemidiaphragm again noted. IMPRESSION: 1. NG tube noted with tip in the upper most portion stomach. Side hole at the gastroesophageal junction. Advancement of the NG tube approximately 10 cm suggested. 2. Persistent small-bowel distention consistent with persistent small-bowel obstruction. Oral contrast now noted in the colon. Interposition of the colon under the hemidiaphragms again noted. Upright abdomen and decubitus views of the abdomen suggested to completely exclude free air. These results will be called to the ordering clinician or representative by the Radiologist Assistant, and communication documented in the PACS or [employer]. Electronically Signed   By: [place]   On: 04/07/2020 05:54   DG Abd Portable 1V-Small Bowel Obstruction Protocol-initial, 8 hr  delay  Result Date: 04/06/2020 CLINICAL DATA:  Small-bowel protocol.  8 hours post contrast film. EXAM: PORTABLE ABDOMEN - 1 VIEW COMPARISON:  04/05/2020 FINDINGS: Dilated gas-filled small bowel. Small amount of gas in the colon. Dilute contrast is suggested in the small bowel. No definite contrast material in the colon. Changes consistent with high-grade small bowel obstruction. Enteric tube with tip in the left upper  quadrant consistent with location in the upper stomach. Degenerative changes in the lumbar spine. Lumbar scoliosis convex towards the right. IMPRESSION: Dilute contrast suggested in the small bowel. No contrast material identified in the colon. Changes consistent with high-grade small bowel obstruction. Electronically Signed   By: Lucienne Capers M.D.   On: 04/06/2020 03:01   DG Abd Portable 1V-Small Bowel Protocol-Position Verification  Result Date: 04/05/2020 CLINICAL DATA:  75 year old male status post NG placement. EXAM: PORTABLE ABDOMEN - 1 VIEW COMPARISON:  CT abdomen pelvis dated 04/05/2020. FINDINGS: Enteric tube with side-port in the proximal stomach and tip in the body of the stomach. Dilated small bowel loop in the mid abdomen measures up to 6.1 cm in caliber. IMPRESSION: 1. Enteric tube with tip in the body of the stomach. 2. Dilated small bowel loop in the mid abdomen. Electronically Signed   By: Anner Crete M.D.   On: 04/05/2020 17:12     Subjective: No complaints, estimated this morning wants to go home.  Discharge Exam: Vitals:   04/08/20 0526 04/08/20 0721  BP: 101/64   Pulse: 86   Resp: 16   Temp:    SpO2: 98% 97%   Vitals:   04/07/20 2004 04/07/20 2140 04/08/20 0526 04/08/20 0721  BP:  (!) 158/105 101/64   Pulse:   86   Resp:   16   Temp:      TempSrc:      SpO2: 98%  98% 97%  Weight:      Height:        General: Pt is alert, awake, not in acute distress Cardiovascular: RRR, S1/S2 +, no rubs, no gallops Respiratory: CTA bilaterally, no wheezing, no rhonchi Abdominal: Soft, NT, ND, bowel sounds + Extremities: no edema, no cyanosis    The results of significant diagnostics from this hospitalization (including imaging, microbiology, ancillary and laboratory) are listed below for reference.     Microbiology: Recent Results (from the past 240 hour(s))  Resp Panel by RT-PCR (Flu A&B, Covid) Nasopharyngeal Swab     Status: None   Collection Time:  04/05/20  3:43 PM   Specimen: Nasopharyngeal Swab; Nasopharyngeal(NP) swabs in vial transport medium  Result Value Ref Range Status   SARS Coronavirus 2 by RT PCR NEGATIVE NEGATIVE Final    Comment: (NOTE) SARS-CoV-2 target nucleic acids are NOT DETECTED.  The SARS-CoV-2 RNA is generally detectable in upper respiratory specimens during the acute phase of infection. The lowest concentration of SARS-CoV-2 viral copies this assay can detect is 138 copies/mL. A negative result does not preclude SARS-Cov-2 infection and should not be used as the sole basis for treatment or other patient management decisions. A negative result may occur with  improper specimen collection/handling, submission of specimen other than nasopharyngeal swab, presence of viral mutation(s) within the areas targeted by this assay, and inadequate number of viral copies(<138 copies/mL). A negative result must be combined with clinical observations, patient history, and epidemiological information. The expected result is Negative.  Fact Sheet for Patients:  EntrepreneurPulse.com.au  Fact Sheet for Healthcare Providers:  IncredibleEmployment.be  This test is no t  yet approved or cleared by the [place] and  has been authorized for detection and/or diagnosis of SARS-CoV-2 by FDA under an Emergency Use Authorization (EUA). This EUA will remain  in effect (meaning this test can be used) for the duration of the COVID-19 declaration under Section 564(b)(1) of the Act, 21 U.S.C.section 360bbb-3(b)(1), unless the authorization is terminated  or revoked sooner.       Influenza A by PCR NEGATIVE NEGATIVE Final   Influenza B by PCR NEGATIVE NEGATIVE Final    Comment: (NOTE) The Xpert Xpress SARS-CoV-2/FLU/RSV plus assay is intended as an aid in the diagnosis of influenza from Nasopharyngeal swab specimens and should not be used as a sole basis for treatment. Nasal washings  and aspirates are unacceptable for Xpert Xpress SARS-CoV-2/FLU/RSV testing.  Fact Sheet for Patients: EntrepreneurPulse.com.au  Fact Sheet for Healthcare Providers: IncredibleEmployment.be  This test is not yet approved or cleared by the [place] FDA and has been authorized for detection and/or diagnosis of SARS-CoV-2 by FDA under an Emergency Use Authorization (EUA). This EUA will remain in effect (meaning this test can be used) for the duration of the COVID-19 declaration under Section 564(b)(1) of the Act, 21 U.S.C. section 360bbb-3(b)(1), unless the authorization is terminated or revoked.  Performed at [facility], [place] [address], [place], [place] [address]      Labs: BNP (last 3 results) No results for input(s): BNP in the last 8760 hours. Basic Metabolic Panel: Recent Labs  Lab 04/05/20 0932 04/05/20 2054 04/06/20 0421  NA 132* 134* 134*  K 3.3* 3.2* 4.3  CL 90* 104 106  CO2 30 24 21*  GLUCOSE 281* 81 73  BUN 10 12 10   CREATININE 6.66* 0.61 0.64  CALCIUM 8.6* 8.1* 7.8*  MG  --  1.5* 1.9  PHOS  --  3.4  --    Liver Function Tests: Recent Labs  Lab 04/05/20 0932 04/05/20 2054 04/06/20 0421  AST 28  --  23  ALT 14  --  11  ALKPHOS 114  --  44  BILITOT 1.0  --  1.0  PROT 7.8  --  4.6*  ALBUMIN 3.8 2.8* 2.5*   Recent Labs  Lab 04/05/20 0932  LIPASE 26   No results for input(s): AMMONIA in the last 168 hours. CBC: Recent Labs  Lab 04/05/20 0932 04/06/20 0421  WBC 6.3 8.8  NEUTROABS 4.7  --   HGB 12.4* 13.5  HCT 38.4* 40.3  MCV 84.8 96.4  PLT 189 157   Cardiac Enzymes: No results for input(s): CKTOTAL, CKMB, CKMBINDEX, TROPONINI in the last 168 hours. BNP: Invalid input(s): POCBNP CBG: Recent Labs  Lab 04/06/20 0738 04/06/20 0825 04/07/20 0716 04/08/20 0756  GLUCAP 58* 118* 108* 105*   D-Dimer No results for input(s): DDIMER in the last 72 hours. Hgb A1c No results for  input(s): HGBA1C in the last 72 hours. Lipid Profile No results for input(s): CHOL, HDL, LDLCALC, TRIG, CHOLHDL, LDLDIRECT in the last 72 hours. Thyroid function studies No results for input(s): TSH, T4TOTAL, T3FREE, THYROIDAB in the last 72 hours.  Invalid input(s): FREET3 Anemia work up No results for input(s): VITAMINB12, FOLATE, FERRITIN, TIBC, IRON, RETICCTPCT in the last 72 hours. Urinalysis    Component Value Date/Time   COLORURINE YELLOW 04/06/2020 0100   APPEARANCEUR CLEAR 04/06/2020 0100   LABSPEC 1.017 04/06/2020 0100   PHURINE 6.0 04/06/2020 0100   GLUCOSEU NEGATIVE 04/06/2020 0100   HGBUR NEGATIVE 04/06/2020 0100   BILIRUBINUR  NEGATIVE 04/06/2020 0100   KETONESUR 5 (A) 04/06/2020 0100   PROTEINUR NEGATIVE 04/06/2020 0100   NITRITE NEGATIVE 04/06/2020 0100   LEUKOCYTESUR NEGATIVE 04/06/2020 0100   Sepsis Labs Invalid input(s): PROCALCITONIN,  WBC,  LACTICIDVEN Microbiology Recent Results (from the past 240 hour(s))  Resp Panel by RT-PCR (Flu A&B, Covid) Nasopharyngeal Swab     Status: None   Collection Time: 04/05/20  3:43 PM   Specimen: Nasopharyngeal Swab; Nasopharyngeal(NP) swabs in vial transport medium  Result Value Ref Range Status   SARS Coronavirus 2 by RT PCR NEGATIVE NEGATIVE Final    Comment: (NOTE) SARS-CoV-2 target nucleic acids are NOT DETECTED.  The SARS-CoV-2 RNA is generally detectable in upper respiratory specimens during the acute phase of infection. The lowest concentration of SARS-CoV-2 viral copies this assay can detect is 138 copies/mL. A negative result does not preclude SARS-Cov-2 infection and should not be used as the sole basis for treatment or other patient management decisions. A negative result may occur with  improper specimen collection/handling, submission of specimen other than nasopharyngeal swab, presence of viral mutation(s) within the areas targeted by this assay, and inadequate number of viral copies(<138 copies/mL). A  negative result must be combined with clinical observations, patient history, and epidemiological information. The expected result is Negative.  Fact Sheet for Patients:  EntrepreneurPulse.com.au  Fact Sheet for Healthcare Providers:  IncredibleEmployment.be  This test is no t yet approved or cleared by the [place] FDA and  has been authorized for detection and/or diagnosis of SARS-CoV-2 by FDA under an Emergency Use Authorization (EUA). This EUA will remain  in effect (meaning this test can be used) for the duration of the COVID-19 declaration under Section 564(b)(1) of the Act, 21 U.S.C.section 360bbb-3(b)(1), unless the authorization is terminated  or revoked sooner.       Influenza A by PCR NEGATIVE NEGATIVE Final   Influenza B by PCR NEGATIVE NEGATIVE Final    Comment: (NOTE) The Xpert Xpress SARS-CoV-2/FLU/RSV plus assay is intended as an aid in the diagnosis of influenza from Nasopharyngeal swab specimens and should not be used as a sole basis for treatment. Nasal washings and aspirates are unacceptable for Xpert Xpress SARS-CoV-2/FLU/RSV testing.  Fact Sheet for Patients: EntrepreneurPulse.com.au  Fact Sheet for Healthcare Providers: IncredibleEmployment.be  This test is not yet approved or cleared by the [place] FDA and has been authorized for detection and/or diagnosis of SARS-CoV-2 by FDA under an Emergency Use Authorization (EUA). This EUA will remain in effect (meaning this test can be used) for the duration of the COVID-19 declaration under Section 564(b)(1) of the Act, 21 U.S.C. section 360bbb-3(b)(1), unless the authorization is terminated or revoked.  Performed at [facility], [place] [address], [place], [place] [address]      Time coordinating discharge: Over 40 minutes  SIGNED:   Charlynne Cousins, MD  [facility] 04/08/2020, 9:40  AM Pager   If 7PM-7AM, please contact night-coverage [email or web address] Password [email or web address]

## 2020-04-08 NOTE — Progress Notes (Signed)
Patient pulled NG tube out this AM while sleeping. MD on call notified via paging system. NG was clamped and no N&V.

## 2024-01-02 DEATH — deceased
